# Patient Record
Sex: Male | Born: 1940 | Race: White | Hispanic: No | Marital: Married | Smoking: Former smoker
Health system: Southern US, Community
[De-identification: ages and names within clinical notes are randomized; demographics above are authoritative.]

## PROBLEM LIST (undated history)

## (undated) DIAGNOSIS — I252 Old myocardial infarction: Secondary | ICD-10-CM

## (undated) DIAGNOSIS — E119 Type 2 diabetes mellitus without complications: Secondary | ICD-10-CM

## (undated) DIAGNOSIS — I2510 Atherosclerotic heart disease of native coronary artery without angina pectoris: Secondary | ICD-10-CM

## (undated) DIAGNOSIS — I10 Essential (primary) hypertension: Secondary | ICD-10-CM

## (undated) DIAGNOSIS — R931 Abnormal findings on diagnostic imaging of heart and coronary circulation: Secondary | ICD-10-CM

## (undated) DIAGNOSIS — I255 Ischemic cardiomyopathy: Secondary | ICD-10-CM

## (undated) DIAGNOSIS — E785 Hyperlipidemia, unspecified: Secondary | ICD-10-CM

## (undated) DIAGNOSIS — I2109 ST elevation (STEMI) myocardial infarction involving other coronary artery of anterior wall: Secondary | ICD-10-CM

## (undated) HISTORY — PX: CERVICAL SPINE SURGERY: SHX589

## (undated) HISTORY — DX: Abnormal findings on diagnostic imaging of heart and coronary circulation: R93.1

## (undated) HISTORY — DX: Type 2 diabetes mellitus without complications: E11.9

## (undated) HISTORY — DX: ST elevation (STEMI) myocardial infarction involving other coronary artery of anterior wall: I21.09

## (undated) HISTORY — DX: Essential (primary) hypertension: I10

## (undated) HISTORY — DX: Atherosclerotic heart disease of native coronary artery without angina pectoris: I25.10

## (undated) HISTORY — DX: Old myocardial infarction: I25.2

## (undated) HISTORY — PX: OTHER SURGICAL HISTORY: SHX169

## (undated) HISTORY — DX: Ischemic cardiomyopathy: I25.5

---

## 2003-10-19 ENCOUNTER — Emergency Department (HOSPITAL_COMMUNITY): Admission: EM | Admit: 2003-10-19 | Discharge: 2003-10-19 | Payer: Self-pay | Admitting: Emergency Medicine

## 2003-11-01 ENCOUNTER — Ambulatory Visit (HOSPITAL_COMMUNITY): Admission: RE | Admit: 2003-11-01 | Discharge: 2003-11-01 | Payer: Self-pay | Admitting: Emergency Medicine

## 2014-12-11 ENCOUNTER — Other Ambulatory Visit: Payer: Self-pay | Admitting: Family Medicine

## 2014-12-11 ENCOUNTER — Ambulatory Visit
Admission: RE | Admit: 2014-12-11 | Discharge: 2014-12-11 | Disposition: A | Discharge status: Home / Self Care | Payer: Medicare Other | Source: Ambulatory Visit | Attending: Family Medicine | Admitting: Family Medicine

## 2014-12-11 DIAGNOSIS — M533 Sacrococcygeal disorders, not elsewhere classified: Principal | ICD-10-CM

## 2014-12-11 DIAGNOSIS — G8929 Other chronic pain: Secondary | ICD-10-CM

## 2015-12-08 ENCOUNTER — Other Ambulatory Visit (HOSPITAL_COMMUNITY): Payer: Medicare Other

## 2015-12-08 ENCOUNTER — Encounter (HOSPITAL_COMMUNITY): Payer: Self-pay | Admitting: Nurse Practitioner

## 2015-12-08 ENCOUNTER — Inpatient Hospital Stay (HOSPITAL_COMMUNITY)
Admission: EM | Admit: 2015-12-08 | Discharge: 2015-12-10 | DRG: 247 | Disposition: A | Discharge status: Home / Self Care | Payer: Medicare Other | Source: Ambulatory Visit | Attending: Interventional Cardiology | Admitting: Interventional Cardiology

## 2015-12-08 ENCOUNTER — Encounter (HOSPITAL_COMMUNITY)
Admission: EM | Disposition: A | Discharge status: Home / Self Care | Payer: Self-pay | Source: Ambulatory Visit | Attending: Interventional Cardiology

## 2015-12-08 DIAGNOSIS — D72829 Elevated white blood cell count, unspecified: Secondary | ICD-10-CM | POA: Diagnosis present

## 2015-12-08 DIAGNOSIS — E785 Hyperlipidemia, unspecified: Secondary | ICD-10-CM

## 2015-12-08 DIAGNOSIS — E1159 Type 2 diabetes mellitus with other circulatory complications: Secondary | ICD-10-CM | POA: Diagnosis not present

## 2015-12-08 DIAGNOSIS — I2510 Atherosclerotic heart disease of native coronary artery without angina pectoris: Secondary | ICD-10-CM | POA: Diagnosis present

## 2015-12-08 DIAGNOSIS — I1310 Hypertensive heart and chronic kidney disease without heart failure, with stage 1 through stage 4 chronic kidney disease, or unspecified chronic kidney disease: Secondary | ICD-10-CM | POA: Diagnosis present

## 2015-12-08 DIAGNOSIS — E1122 Type 2 diabetes mellitus with diabetic chronic kidney disease: Secondary | ICD-10-CM | POA: Diagnosis present

## 2015-12-08 DIAGNOSIS — N183 Chronic kidney disease, stage 3 (moderate): Secondary | ICD-10-CM | POA: Diagnosis present

## 2015-12-08 DIAGNOSIS — Z87891 Personal history of nicotine dependence: Secondary | ICD-10-CM

## 2015-12-08 DIAGNOSIS — I2109 ST elevation (STEMI) myocardial infarction involving other coronary artery of anterior wall: Secondary | ICD-10-CM

## 2015-12-08 DIAGNOSIS — R079 Chest pain, unspecified: Secondary | ICD-10-CM | POA: Diagnosis present

## 2015-12-08 DIAGNOSIS — I2102 ST elevation (STEMI) myocardial infarction involving left anterior descending coronary artery: Secondary | ICD-10-CM

## 2015-12-08 DIAGNOSIS — I213 ST elevation (STEMI) myocardial infarction of unspecified site: Secondary | ICD-10-CM

## 2015-12-08 DIAGNOSIS — E119 Type 2 diabetes mellitus without complications: Secondary | ICD-10-CM

## 2015-12-08 DIAGNOSIS — Z955 Presence of coronary angioplasty implant and graft: Secondary | ICD-10-CM

## 2015-12-08 HISTORY — DX: Hyperlipidemia, unspecified: E78.5

## 2015-12-08 HISTORY — DX: Atherosclerotic heart disease of native coronary artery without angina pectoris: I25.10

## 2015-12-08 HISTORY — PX: CARDIAC CATHETERIZATION: SHX172

## 2015-12-08 HISTORY — DX: Type 2 diabetes mellitus without complications: E11.9

## 2015-12-08 LAB — CK TOTAL AND CKMB (NOT AT ARMC)
CK TOTAL: 87 U/L (ref 49–397)
CK TOTAL: 162 U/L (ref 49–397)
CK, MB: 2.8 ng/mL (ref 0.5–5.0)
CK, MB: 17.2 ng/mL — ABNORMAL HIGH (ref 0.5–5.0)
RELATIVE INDEX: INVALID (ref 0.0–2.5)
Relative Index: 10.6 — ABNORMAL HIGH (ref 0.0–2.5)

## 2015-12-08 LAB — COMPREHENSIVE METABOLIC PANEL
ALT: 13 U/L — ABNORMAL LOW (ref 17–63)
AST: 20 U/L (ref 15–41)
Albumin: 3.7 g/dL (ref 3.5–5.0)
Alkaline Phosphatase: 51 U/L (ref 38–126)
Anion gap: 18 — ABNORMAL HIGH (ref 5–15)
BUN: 25 mg/dL — ABNORMAL HIGH (ref 6–20)
CO2: 18 mmol/L — ABNORMAL LOW (ref 22–32)
Calcium: 9.4 mg/dL (ref 8.9–10.3)
Chloride: 103 mmol/L (ref 101–111)
Creatinine, Ser: 1.62 mg/dL — ABNORMAL HIGH (ref 0.61–1.24)
GFR calc Af Amer: 47 mL/min — ABNORMAL LOW (ref >60)
GFR calc non Af Amer: 40 mL/min — ABNORMAL LOW (ref >60)
Glucose, Bld: 199 mg/dL — ABNORMAL HIGH (ref 65–99)
Potassium: 4.7 mmol/L (ref 3.5–5.1)
Sodium: 139 mmol/L (ref 135–145)
Total Bilirubin: 0.4 mg/dL (ref 0.3–1.2)
Total Protein: 6.9 g/dL (ref 6.5–8.1)

## 2015-12-08 LAB — GLUCOSE, CAPILLARY
GLUCOSE-CAPILLARY: 132 mg/dL — AB (ref 65–99)
GLUCOSE-CAPILLARY: 127 mg/dL — AB (ref 65–99)

## 2015-12-08 LAB — CBC
HCT: 39.5 % (ref 39.0–52.0)
Hemoglobin: 12.9 g/dL — ABNORMAL LOW (ref 13.0–17.0)
MCH: 28.4 pg (ref 26.0–34.0)
MCHC: 32.7 g/dL (ref 30.0–36.0)
MCV: 86.8 fL (ref 78.0–100.0)
Platelets: 268 10*3/uL (ref 150–400)
RBC: 4.55 MIL/uL (ref 4.22–5.81)
RDW: 13.7 % (ref 11.5–15.5)
WBC: 15.9 10*3/uL — ABNORMAL HIGH (ref 4.0–10.5)

## 2015-12-08 LAB — PROTIME-INR
INR: 1.03 (ref 0.00–1.49)
Prothrombin Time: 13.7 seconds (ref 11.6–15.2)

## 2015-12-08 LAB — LIPID PANEL
Cholesterol: 177 mg/dL (ref 0–200)
HDL: 31 mg/dL — ABNORMAL LOW (ref >40)
LDL Cholesterol: 108 mg/dL — ABNORMAL HIGH (ref 0–99)
Total CHOL/HDL Ratio: 5.7 RATIO
Triglycerides: 188 mg/dL — ABNORMAL HIGH (ref <150)
VLDL: 38 mg/dL (ref 0–40)

## 2015-12-08 LAB — APTT: aPTT: 26 seconds (ref 24–37)

## 2015-12-08 LAB — TROPONIN I: Troponin I: <0.03 ng/mL (ref <0.031)

## 2015-12-08 LAB — MRSA PCR SCREENING: MRSA by PCR: NEGATIVE

## 2015-12-08 SURGERY — LEFT HEART CATH AND CORONARY ANGIOGRAPHY

## 2015-12-08 MED ORDER — ATORVASTATIN CALCIUM 80 MG PO TABS
80.0000 mg | ORAL_TABLET | Freq: Every day | ORAL | Status: DC
  Filled 2015-12-08 (×2): qty 1

## 2015-12-08 MED ORDER — HEPARIN SODIUM (PORCINE) 1000 UNIT/ML IJ SOLN
INTRAMUSCULAR | Status: DC | PRN
  Administered 2015-12-08: 8000 [IU] via INTRAVENOUS
  Administered 2015-12-08: 3000 [IU] via INTRAVENOUS

## 2015-12-08 MED ORDER — NITROGLYCERIN 1 MG/10 ML FOR IR/CATH LAB
INTRA_ARTERIAL | Status: AC
  Filled 2015-12-08: qty 10

## 2015-12-08 MED ORDER — ONDANSETRON HCL 4 MG/2ML IJ SOLN: 4.0000 mg | Freq: Four times a day (QID) | INTRAMUSCULAR | Status: DC | PRN

## 2015-12-08 MED ORDER — MIDAZOLAM HCL 2 MG/2ML IJ SOLN
INTRAMUSCULAR | Status: DC | PRN
  Administered 2015-12-08: 1 mg via INTRAVENOUS

## 2015-12-08 MED ORDER — VERAPAMIL HCL 2.5 MG/ML IV SOLN
INTRAVENOUS | Status: DC | PRN
  Administered 2015-12-08: 10 mL via INTRA_ARTERIAL

## 2015-12-08 MED ORDER — TIROFIBAN HCL IN NACL 5-0.9 MG/100ML-% IV SOLN: 0.1500 ug/kg/min | INTRAVENOUS | Status: AC

## 2015-12-08 MED ORDER — ACETAMINOPHEN 325 MG PO TABS: 650.0000 mg | ORAL_TABLET | ORAL | Status: DC | PRN

## 2015-12-08 MED ORDER — LIDOCAINE HCL (PF) 1 % IJ SOLN
INTRAMUSCULAR | Status: DC | PRN
  Administered 2015-12-08: 4 mL

## 2015-12-08 MED ORDER — NITROGLYCERIN 1 MG/10 ML FOR IR/CATH LAB
INTRA_ARTERIAL | Status: DC | PRN
  Administered 2015-12-08: 200 ug via INTRACORONARY

## 2015-12-08 MED ORDER — MIDAZOLAM HCL 2 MG/2ML IJ SOLN
INTRAMUSCULAR | Status: AC
  Filled 2015-12-08: qty 2

## 2015-12-08 MED ORDER — TIROFIBAN (AGGRASTAT) BOLUS VIA INFUSION
INTRAVENOUS | Status: DC | PRN
  Administered 2015-12-08: 2500 ug via INTRAVENOUS

## 2015-12-08 MED ORDER — HEPARIN (PORCINE) IN NACL 2-0.9 UNIT/ML-% IJ SOLN
INTRAMUSCULAR | Status: AC
  Filled 2015-12-08: qty 2000

## 2015-12-08 MED ORDER — ASPIRIN 81 MG PO CHEW
81.0000 mg | CHEWABLE_TABLET | Freq: Every day | ORAL | Status: DC
  Administered 2015-12-09 – 2015-12-10 (×2): 81 mg via ORAL
  Filled 2015-12-08 (×2): qty 1

## 2015-12-08 MED ORDER — TIROFIBAN HCL IN NACL 5-0.9 MG/100ML-% IV SOLN
INTRAVENOUS | Status: DC | PRN
  Administered 2015-12-08: 0.075 ug/kg/min via INTRAVENOUS

## 2015-12-08 MED ORDER — LIDOCAINE HCL (PF) 1 % IJ SOLN
INTRAMUSCULAR | Status: AC
  Filled 2015-12-08: qty 30

## 2015-12-08 MED ORDER — SODIUM CHLORIDE 0.9 % IV SOLN: 250.0000 mL | INTRAVENOUS | Status: DC | PRN

## 2015-12-08 MED ORDER — TICAGRELOR 90 MG PO TABS
90.0000 mg | ORAL_TABLET | Freq: Two times a day (BID) | ORAL | Status: DC
  Administered 2015-12-08 – 2015-12-10 (×4): 90 mg via ORAL
  Filled 2015-12-08 (×4): qty 1

## 2015-12-08 MED ORDER — SODIUM CHLORIDE 0.9% FLUSH: 3.0000 mL | INTRAVENOUS | Status: DC | PRN

## 2015-12-08 MED ORDER — FENTANYL CITRATE (PF) 100 MCG/2ML IJ SOLN
INTRAMUSCULAR | Status: DC | PRN
  Administered 2015-12-08: 25 ug via INTRAVENOUS

## 2015-12-08 MED ORDER — TICAGRELOR 90 MG PO TABS
ORAL_TABLET | ORAL | Status: DC | PRN
  Administered 2015-12-08: 180 mg via ORAL

## 2015-12-08 MED ORDER — TICAGRELOR 90 MG PO TABS
ORAL_TABLET | ORAL | Status: AC
  Filled 2015-12-08: qty 1

## 2015-12-08 MED ORDER — HEPARIN SODIUM (PORCINE) 1000 UNIT/ML IJ SOLN
INTRAMUSCULAR | Status: AC
  Filled 2015-12-08: qty 1

## 2015-12-08 MED ORDER — VERAPAMIL HCL 2.5 MG/ML IV SOLN
INTRAVENOUS | Status: AC
  Filled 2015-12-08: qty 2

## 2015-12-08 MED ORDER — SODIUM CHLORIDE 0.9 % WEIGHT BASED INFUSION: 1.0000 mL/kg/h | INTRAVENOUS | Status: AC

## 2015-12-08 MED ORDER — METOPROLOL TARTRATE 12.5 MG HALF TABLET
12.5000 mg | ORAL_TABLET | Freq: Two times a day (BID) | ORAL | Status: DC
  Administered 2015-12-08 – 2015-12-10 (×5): 12.5 mg via ORAL
  Filled 2015-12-08 (×5): qty 1

## 2015-12-08 MED ORDER — FENTANYL CITRATE (PF) 100 MCG/2ML IJ SOLN
INTRAMUSCULAR | Status: AC
  Filled 2015-12-08: qty 2

## 2015-12-08 MED ORDER — SODIUM CHLORIDE 0.9% FLUSH
3.0000 mL | Freq: Two times a day (BID) | INTRAVENOUS | Status: DC
Start: 2015-12-08 — End: 2015-12-10
  Administered 2015-12-08 – 2015-12-09 (×3): 3 mL via INTRAVENOUS

## 2015-12-08 MED ORDER — INSULIN ASPART 100 UNIT/ML ~~LOC~~ SOLN: 0.0000 [IU] | Freq: Three times a day (TID) | SUBCUTANEOUS | Status: DC

## 2015-12-08 MED ORDER — HEPARIN SODIUM (PORCINE) 5000 UNIT/ML IJ SOLN
5000.0000 [IU] | Freq: Three times a day (TID) | INTRAMUSCULAR | Status: DC
  Administered 2015-12-09 (×3): 5000 [IU] via SUBCUTANEOUS
  Filled 2015-12-08 (×4): qty 1

## 2015-12-08 MED ORDER — MORPHINE SULFATE (PF) 2 MG/ML IV SOLN: 1.0000 mg | INTRAVENOUS | Status: DC | PRN

## 2015-12-08 MED ORDER — IOHEXOL 350 MG/ML SOLN
INTRAVENOUS | Status: DC | PRN
  Administered 2015-12-08: 165 mL via INTRACARDIAC

## 2015-12-08 SURGICAL SUPPLY — 25 items
BALLN EUPHORA RX 2.5X15 (BALLOONS) ×4
BALLN ~~LOC~~ EMERGE MR 3.5X15 (BALLOONS) ×4
BALLOON EUPHORA RX 2.5X15 (BALLOONS) IMPLANT
BALLOON ~~LOC~~ EMERGE MR 3.5X15 (BALLOONS) IMPLANT
CATH EXTRAC PRONTO 5.5F 138CM (CATHETERS) ×2 IMPLANT
CATH INFINITI 5 FR 3DRC (CATHETERS) ×2 IMPLANT
CATH INFINITI 5 FR JL3.5 (CATHETERS) ×4 IMPLANT
CATH INFINITI 5FR ANG PIGTAIL (CATHETERS) ×4 IMPLANT
CATH INFINITI JR4 5F (CATHETERS) ×2 IMPLANT
CATH LAUNCHER 6FR 3DRIGHT (CATHETERS) IMPLANT
CATHETER LAUNCHER 6FR 3DRIGHT (CATHETERS) ×4
DEVICE RAD COMP TR BAND LRG (VASCULAR PRODUCTS) ×4 IMPLANT
GLIDESHEATH SLEND SS 6F .021 (SHEATH) ×2 IMPLANT
KIT ENCORE 26 ADVANTAGE (KITS) ×2 IMPLANT
KIT HEART LEFT (KITS) ×4 IMPLANT
PACK CARDIAC CATHETERIZATION (CUSTOM PROCEDURE TRAY) ×4 IMPLANT
STENT PROMUS PREM MR 3.0X20 (Permanent Stent) ×2 IMPLANT
SYR MEDRAD MARK V 150ML (SYRINGE) ×4 IMPLANT
TRANSDUCER W/STOPCOCK (MISCELLANEOUS) ×4 IMPLANT
TUBING CIL FLEX 10 FLL-RA (TUBING) ×4 IMPLANT
VALVE GUARDIAN II ~~LOC~~ HEMO (MISCELLANEOUS) ×2 IMPLANT
WIRE ASAHI PROWATER 180CM (WIRE) ×2 IMPLANT
WIRE HI TORQ BMW 190CM (WIRE) ×2 IMPLANT
WIRE HI TORQ VERSACORE-J 145CM (WIRE) ×2 IMPLANT
WIRE SAFE-T 1.5MM-J .035X260CM (WIRE) ×6 IMPLANT

## 2015-12-08 NOTE — H&P (Signed)
History & Physical    Patient ID: Shane Welch MRN: [id number], DOB/AGE: Mar 03, 1941   Admit date: 12/08/2015   Primary Physician: No primary care provider on file. Primary Cardiologist: New - will f/u in [facility]  Patient Profile    75 y/o ? w/o a prior cardiac hx who presented today 2/2 c/p and antlat STEMI.  Past Medical History    Past Medical History  Diagnosis Date  . Type II diabetes mellitus (HCC)     a. Dx ~ 2010.    Past Surgical History  Procedure Laterality Date  . Cervical spine surgery      a. ~ 2002.  . Right knee surgery      a. 08/2015.     Allergies  Allergies  Allergen Reactions  . Sulfa Antibiotics     History of Present Illness    75 y/o ? w/o prior cardiac history.  He was dx with Type II DM about 7 years ago and has been on oral meds since.  He says that his A1c is typically below 7.  He is a retired Geophysicist/field seismologist and has been living in [place], [place] with his wife since his retirement but she has recently been dx with bladder CA and they are in the process of moving back to [place], [place] so that she may received treatment @ [facility].    Shane Welch was in his USOH until the morning of 2/18, when he was raking leaves in his yard and had sudden onset of fatigue and dyspnea.  He walked back to his home and then developed severe substernal chest discomfort.  When pain persisted, he called EMS and was found to have anterolateral ST elevation with inferior reciprocal changes.  A code STEMI was activated and he was taken emergently to the [facility] cath lab, where he underwent diagnostic cath revealing severe proximal LAD disease with large thrombus burden.  This was successfully stented using a 3.0 x 20 mm promus DES.  He tolerated the procedure well.  Currently, he is c/p free.  Home Medications    Metformin Glipizide Lisinopril  Family History    Family History  Problem Relation Age of Onset  . Cancer Father     died in his 48's.  . Dementia Mother    died in her 75's.    Social History    Social History   Social History  . Marital Status: Married    Spouse Name: N/A  . Number of Children: N/A  . Years of Education: N/A   Occupational History  . Not on file.   Social History Main Topics  . Smoking status: Former Smoker -- 1.00 packs/day for 30 years    Types: Cigarettes  . Smokeless tobacco: Not on file     Comment: quit 1993  . Alcohol Use: 0.0 oz/week    0 Standard drinks or equivalent per week     Comment: rare drink  . Drug Use: No  . Sexual Activity: Not on file   Other Topics Concern  . Not on file   Social History Narrative   Retired Geophysicist/field seismologist.  Following retirement, he moved to [place], [place], where he has lived with his wife.  His wife was recently Dx with bladder CA and they are in the process of moving back to [place], [place] so that she may received treatment @ [facility].  He has been very active.  Enjoys fishing and the outdoors.     Review of Systems  General:  No chills, fever, night sweats or weight changes.  Cardiovascular:  +++ chest pain, dyspnea on exertion, fatigue earlier this AM.  No edema, orthopnea, palpitations, paroxysmal nocturnal dyspnea. Dermatological: No rash, lesions/masses Respiratory: No cough, dyspnea Urologic: No hematuria, dysuria Abdominal:   No nausea, vomiting, diarrhea, bright red blood per rectum, melena, or hematemesis Neurologic:  No visual changes, wkns, changes in mental status. All other systems reviewed and are otherwise negative except as noted above.  Physical Exam    Blood pressure 174/89, pulse 0, resp. rate 0, height 5\' 11"  (1.803 m), weight 220 lb 3.8 oz (99.9 kg), SpO2 0 %.  General: Pleasant, NAD Psych: Normal affect. Neuro: Alert and oriented X 3. Moves all extremities spontaneously. HEENT: Normal  Neck: Supple without bruits or JVD. Lungs:  Resp regular and unlabored, CTA. Heart: RRR no s3, s4, or murmurs. Abdomen: Soft, non-tender, non-distended, BS + x 4.    Extremities: No clubbing, cyanosis or edema. DP/PT/Radials 2+ and equal bilaterally. R wrist cath site with band in place.  No bleeding/bruit/hematoma.  Labs     Recent Labs  12/08/15 1237  TROPONINI <0.03   Lab Results  Component Value Date   WBC 15.9* 12/08/2015   HGB 12.9* 12/08/2015   HCT 39.5 12/08/2015   MCV 86.8 12/08/2015   PLT 268 12/08/2015    Recent Labs Lab 12/08/15 1237  NA 139  K 4.7  CL 103  CO2 18*  BUN 25*  CREATININE 1.62*  CALCIUM 9.4  PROT 6.9  BILITOT 0.4  ALKPHOS 51  ALT 13*  AST 20  GLUCOSE 199*   Lab Results  Component Value Date   CHOL 177 12/08/2015   HDL 31* 12/08/2015   LDLCALC 108* 12/08/2015   TRIG 188* 12/08/2015    Radiology Studies    No results found.  ECG & Cardiac Imaging    Pre PCI: RSR, 92, LAD, antlat ST elev with inf ST dep, pvc's  Post PCI: RSR, 80, PAC's, LAD, delayed R progression.  Assessment & Plan    1.  Acute anterolateral STEMI/CAD:  Pt presented 2/18 with c/p, dyspnea, and fatigue and was found to have antlat ST elevation with inf recip changes.  Now s/p PCI/DES to the LAD with resolution of c/p and ST changes.  Cont asa, bb, high potentcy statin, and brilinta.  He was on ACEI @ home but will hold off on resuming as creat elevated @ 1.62 (was 1.3 in December, 1.2 in October).  Check echo.  Eventual cardiac rehab.  2.  DM II:  Hold home oral meds.  Add SSI.  3.  Hypertensive heart disease:  He says that BP is usually borderline @ home.  He was on lisinopril for renal protection.  Follow on bb.  ACEI on hold in setting of renal insuff of unknown chronicity.  4. Lipids:  Says he was told that he should be on a statin in the past but has been reluctant b/c he has many friends that have not tolerated statins.  He is willing to try @ this time however.  LDL 108 this AM.  LFT's ok.  Cont lipitor 80.    5.  CKD II-III with acute worsening: Creat 1.62 this AM.  Care Everywhere shows creat of 1.2 in 07/2015 and  1.3 in 09/2015.  He is receiving post-cath hydration.  Hold ACEI as above. F/U in AM.  6.  Leukocytosis:  Likely reactive in setting of MI.  Afebrile.  Follow.  Signed,  Nicolasa Ducking, NP 12/08/2015, 2:07 PM    I have examined the patient and reviewed assessment and plan and discussed with patient.  Agree with above as stated.  Patient with emergent cath.  Needs DAPT for a year along with secondary prevention.  Echo to check LV function.  ACE-I when renal function stable.  Aggressive DM control. Continue to cycle enzymes.  VARANASI,JAYADEEP S.

## 2015-12-09 ENCOUNTER — Inpatient Hospital Stay (HOSPITAL_COMMUNITY): Payer: Medicare Other

## 2015-12-09 DIAGNOSIS — I2510 Atherosclerotic heart disease of native coronary artery without angina pectoris: Secondary | ICD-10-CM

## 2015-12-09 DIAGNOSIS — N183 Chronic kidney disease, stage 3 (moderate): Secondary | ICD-10-CM

## 2015-12-09 LAB — LIPID PANEL
CHOLESTEROL: 163 mg/dL (ref 0–200)
HDL: 28 mg/dL — ABNORMAL LOW (ref >40)
LDL CALC: 114 mg/dL — AB (ref 0–99)
TRIGLYCERIDES: 106 mg/dL (ref <150)
Total CHOL/HDL Ratio: 5.8 RATIO
VLDL: 21 mg/dL (ref 0–40)

## 2015-12-09 LAB — BASIC METABOLIC PANEL
ANION GAP: 12 (ref 5–15)
BUN: 23 mg/dL — AB (ref 6–20)
CALCIUM: 9.2 mg/dL (ref 8.9–10.3)
CO2: 25 mmol/L (ref 22–32)
Chloride: 105 mmol/L (ref 101–111)
Creatinine, Ser: 1.5 mg/dL — ABNORMAL HIGH (ref 0.61–1.24)
GFR calc Af Amer: 51 mL/min — ABNORMAL LOW (ref >60)
GFR, EST NON AFRICAN AMERICAN: 44 mL/min — AB (ref >60)
Glucose, Bld: 102 mg/dL — ABNORMAL HIGH (ref 65–99)
POTASSIUM: 4.9 mmol/L (ref 3.5–5.1)
SODIUM: 142 mmol/L (ref 135–145)

## 2015-12-09 LAB — CBC
HCT: 38.3 % — ABNORMAL LOW (ref 39.0–52.0)
Hemoglobin: 12.3 g/dL — ABNORMAL LOW (ref 13.0–17.0)
MCH: 28.2 pg (ref 26.0–34.0)
MCHC: 32.1 g/dL (ref 30.0–36.0)
MCV: 87.8 fL (ref 78.0–100.0)
Platelets: 247 10*3/uL (ref 150–400)
RBC: 4.36 MIL/uL (ref 4.22–5.81)
RDW: 13.6 % (ref 11.5–15.5)
WBC: 10.8 10*3/uL — AB (ref 4.0–10.5)

## 2015-12-09 LAB — GLUCOSE, CAPILLARY
GLUCOSE-CAPILLARY: 102 mg/dL — AB (ref 65–99)
Glucose-Capillary: 113 mg/dL — ABNORMAL HIGH (ref 65–99)
Glucose-Capillary: 94 mg/dL (ref 65–99)

## 2015-12-09 LAB — CK TOTAL AND CKMB (NOT AT ARMC)
CK, MB: 18.2 ng/mL — AB (ref 0.5–5.0)
RELATIVE INDEX: 9.8 — AB (ref 0.0–2.5)
Total CK: 185 U/L (ref 49–397)

## 2015-12-09 MED ORDER — GLIMEPIRIDE 1 MG PO TABS: 2.0000 mg | ORAL_TABLET | Freq: Every day | ORAL | Status: DC

## 2015-12-09 MED ORDER — ACETAMINOPHEN 325 MG PO TABS: 650.0000 mg | ORAL_TABLET | Freq: Four times a day (QID) | ORAL | Status: DC | PRN

## 2015-12-09 NOTE — Progress Notes (Signed)
Utilization Review Completed.Delainy Mcelhiney T2/19/2017  

## 2015-12-09 NOTE — Progress Notes (Addendum)
SUBJECTIVE:  Feels very well this morning. No chest discomfort. Feels back to normal.  OBJECTIVE:   Vitals:   Filed Vitals:   12/09/15 0200 12/09/15 0400 12/09/15 0500 12/09/15 0600  BP: 136/57 128/69 124/64 124/71  Pulse: 57 55 56 58  Temp:  98.4 F (36.9 C)    TempSrc:  Oral    Resp: 14 16 12 12   Height:      Weight:      SpO2: 100% 100% 98% 97%   I&O's:   Intake/Output Summary (Last 24 hours) at 12/09/15 2297 Last data filed at 12/09/15 0200  Gross per 24 hour  Intake   1010 ml  Output   2275 ml  Net  -1265 ml   TELEMETRY: Reviewed telemetry pt in normal sinus rhythm:     PHYSICAL EXAM General: Well developed, well nourished, in no acute distress Head:   Normal cephalic and atramatic  Lungs:   Clear bilaterally to auscultation. Heart:   HRRR S1 S2  No JVD.  2+ right radial pulse, no hematoma, no bruising Abdomen: abdomen soft and non-tender Msk:  Back normal,  Normal strength and tone for age. Extremities:   No edema.   Neuro: Alert and oriented. Psych:  Normal affect, responds appropriately Skin: No rash   LABS: Basic Metabolic Panel:  Recent Labs  98/92/11 1237 12/09/15 0219  NA 139 142  K 4.7 4.9  CL 103 105  CO2 18* 25  GLUCOSE 199* 102*  BUN 25* 23*  CREATININE 1.62* 1.50*  CALCIUM 9.4 9.2   Liver Function Tests:  Recent Labs  12/08/15 1237  AST 20  ALT 13*  ALKPHOS 51  BILITOT 0.4  PROT 6.9  ALBUMIN 3.7   No results for input(s): LIPASE, AMYLASE in the last 72 hours. CBC:  Recent Labs  12/08/15 1237 12/09/15 0219  WBC 15.9* 10.8*  HGB 12.9* 12.3*  HCT 39.5 38.3*  MCV 86.8 87.8  PLT 268 247   Cardiac Enzymes:  Recent Labs  12/08/15 1237 12/08/15 1400 12/08/15 1930 12/09/15 0219  CKTOTAL  --  87 162 185  CKMB  --  2.8 17.2* 18.2*  TROPONINI <0.03  --   --   --    BNP: Invalid input(s): POCBNP D-Dimer: No results for input(s): DDIMER in the last 72 hours. Hemoglobin A1C: No results for input(s): HGBA1C in  the last 72 hours. Fasting Lipid Panel:  Recent Labs  12/09/15 0219  CHOL 163  HDL 28*  LDLCALC 114*  TRIG 106  CHOLHDL 5.8   Thyroid Function Tests: No results for input(s): TSH, T4TOTAL, T3FREE, THYROIDAB in the last 72 hours.  Invalid input(s): FREET3 Anemia Panel: No results for input(s): VITAMINB12, FOLATE, FERRITIN, TIBC, IRON, RETICCTPCT in the last 72 hours. Coag Panel:   Lab Results  Component Value Date   INR 1.03 12/08/2015    RADIOLOGY: No results found.    ASSESSMENT: Status post anterior wall myocardial infarction, diabetes, chronic renal insufficiency  PLAN:   1) I stressed the importance of dual antiplatelet therapy given his recent stent.  He'll need aggressive secondary prevention including beta blocker, statin. Holding off on ACE inhibitor at this time. He does have some mild renal insufficiency. This appears stable. Await echocardiogram results to evaluate left ventricular function. Certainly, if his ejection fraction is less than 40%, would add an ACE inhibitor. He would benefit from a low-dose ACE inhibitor regardless given his diabetes, as long as his renal function tolerates this.  Restart ACE  inhibitor tomorrow morning if creatinine is stable.  2) hyperlipidemia: LDL above target. Continue atorvastatin 80 mg daily.  3) diabetes: Blood sugar well-controlled early this morning. Continue sliding scale insulin. Metformin and restart tomorrow.  4) He would benefit from walking with cardiac rehabilitation. We'll plan on transferring to telemetry.  Possible discharge tomorrow.  He lives in [place]. He may decide he wants to follow-up in that office.  Corky Crafts, MD  12/09/2015  8:32 AM

## 2015-12-09 NOTE — Progress Notes (Signed)
*  PRELIMINARY RESULTS* Echocardiogram 2D Echocardiogram has been performed.  Shane Welch 12/09/2015, 11:13 AM

## 2015-12-10 ENCOUNTER — Encounter (HOSPITAL_COMMUNITY): Payer: Self-pay | Admitting: Interventional Cardiology

## 2015-12-10 ENCOUNTER — Telehealth: Payer: Self-pay | Admitting: Interventional Cardiology

## 2015-12-10 DIAGNOSIS — E119 Type 2 diabetes mellitus without complications: Secondary | ICD-10-CM

## 2015-12-10 DIAGNOSIS — I2109 ST elevation (STEMI) myocardial infarction involving other coronary artery of anterior wall: Principal | ICD-10-CM

## 2015-12-10 DIAGNOSIS — E785 Hyperlipidemia, unspecified: Secondary | ICD-10-CM

## 2015-12-10 DIAGNOSIS — I2510 Atherosclerotic heart disease of native coronary artery without angina pectoris: Secondary | ICD-10-CM

## 2015-12-10 HISTORY — DX: Type 2 diabetes mellitus without complications: E11.9

## 2015-12-10 HISTORY — DX: Atherosclerotic heart disease of native coronary artery without angina pectoris: I25.10

## 2015-12-10 LAB — BASIC METABOLIC PANEL WITH GFR
Anion gap: 9 (ref 5–15)
BUN: 19 mg/dL (ref 6–20)
CO2: 26 mmol/L (ref 22–32)
Calcium: 9.6 mg/dL (ref 8.9–10.3)
Chloride: 107 mmol/L (ref 101–111)
Creatinine, Ser: 1.29 mg/dL — ABNORMAL HIGH (ref 0.61–1.24)
GFR calc Af Amer: >60 mL/min (ref >60)
GFR calc non Af Amer: 53 mL/min — ABNORMAL LOW (ref >60)
Glucose, Bld: 132 mg/dL — ABNORMAL HIGH (ref 65–99)
Potassium: 5 mmol/L (ref 3.5–5.1)
Sodium: 142 mmol/L (ref 135–145)

## 2015-12-10 LAB — POCT I-STAT, CHEM 8
BUN: 26 mg/dL — ABNORMAL HIGH (ref 6–20)
CREATININE: 1.5 mg/dL — AB (ref 0.61–1.24)
Calcium, Ion: 1.15 mmol/L (ref 1.13–1.30)
Chloride: 105 mmol/L (ref 101–111)
Glucose, Bld: 201 mg/dL — ABNORMAL HIGH (ref 65–99)
HEMATOCRIT: 41 % (ref 39.0–52.0)
HEMOGLOBIN: 13.9 g/dL (ref 13.0–17.0)
POTASSIUM: 4.5 mmol/L (ref 3.5–5.1)
SODIUM: 138 mmol/L (ref 135–145)
TCO2: 21 mmol/L (ref 0–100)

## 2015-12-10 LAB — HEMOGLOBIN A1C
Hgb A1c MFr Bld: 6.7 % — ABNORMAL HIGH (ref 4.8–5.6)
Mean Plasma Glucose: 146 mg/dL

## 2015-12-10 LAB — GLUCOSE, CAPILLARY: Glucose-Capillary: 115 mg/dL — ABNORMAL HIGH (ref 65–99)

## 2015-12-10 LAB — POCT ACTIVATED CLOTTING TIME
ACTIVATED CLOTTING TIME: 198 s
Activated Clotting Time: 250 s

## 2015-12-10 MED ORDER — ENALAPRIL MALEATE 2.5 MG PO TABS: 2.5000 mg | ORAL_TABLET | Freq: Every day | ORAL | Status: DC

## 2015-12-10 MED ORDER — LISINOPRIL 2.5 MG PO TABS
2.5000 mg | ORAL_TABLET | Freq: Every day | ORAL | Status: DC
  Filled 2015-12-10: qty 1

## 2015-12-10 MED ORDER — NITROGLYCERIN 0.4 MG SL SUBL: 0.4000 mg | SUBLINGUAL_TABLET | SUBLINGUAL | Status: DC | PRN

## 2015-12-10 MED ORDER — METOPROLOL TARTRATE 25 MG PO TABS: 12.5000 mg | ORAL_TABLET | Freq: Two times a day (BID) | ORAL | Status: DC

## 2015-12-10 MED ORDER — ASPIRIN 81 MG PO CHEW: 81.0000 mg | CHEWABLE_TABLET | Freq: Every day | ORAL | Status: DC

## 2015-12-10 MED ORDER — ATORVASTATIN CALCIUM 80 MG PO TABS: 80.0000 mg | ORAL_TABLET | Freq: Every day | ORAL | Status: DC

## 2015-12-10 MED ORDER — TICAGRELOR 90 MG PO TABS: 90.0000 mg | ORAL_TABLET | Freq: Two times a day (BID) | ORAL | Status: DC

## 2015-12-10 MED FILL — Heparin Sodium (Porcine) 2 Unit/ML in Sodium Chloride 0.9%: INTRAMUSCULAR | Qty: 1500 | Status: AC

## 2015-12-10 NOTE — Discharge Summary (Signed)
Discharge Summary    Patient ID: Shane Welch,  MRN: [id number], DOB/AGE: 75/75/1942 75 y.o.  Admit date: 12/08/2015 Discharge date: 12/10/2015  Primary Care Provider: No primary care provider on file. Primary Cardiologist: Dr. Eldridge Dace  Discharge Diagnoses    Principal Problem:   Acute MI anterior wall first episode care [facility]) Active Problems:   CAD (coronary artery disease)   Hyperlipidemia   Diabetes (HCC)   Allergies Allergies  Allergen Reactions  . Sulfa Antibiotics Hives    Diagnostic Studies/Procedures    Cardiac cath 12/08/2015 Conclusion     Mid LAD lesion, 99% stenosed. Post intervention with aspiration thrombectomy and a 3.0 x 20 Promus drug-eluting stent postdilated to 3.5 mm, there is a 0% residual stenosis.  Normal LVEDP. Left ventricular gram not done because wants to avoid excessive contrast exposure.  From the right wrist, an EBU 3.5 guide catheter may better engage the left main a future procedure is needed.  Left dominant system. Non-dominant RCA was no selectively injected.  Continue dual antiplatelet therapy for at least a year. He'll need aggressive secondary prevention and diabetes control. Continue IV tirofiban for 4 hours. He'll be watched in the ICU. I suspect to be in the hospital for 2 days. Assess left ventricular function with echocardiogram.  Beta blocker, aspirin, Brilinta and statin. Hold metformin for 48 hours at least. We'll need to see when creatinine is in the normal range.      Echocardiogram 12/09/2015 LV EF: 40% -  45%  ------------------------------------------------------------------- Indications:   CAD of native vessels 414.01.  ------------------------------------------------------------------- History:  PMH: Former Smoker. PMH:  Myocardial infarction. Risk factors: Diabetes mellitus.  ------------------------------------------------------------------- Study Conclusions  - Left ventricle: The cavity  size was normal. Wall thickness was increased in a pattern of moderate LVH. Systolic function was mildly to moderately reduced. The estimated ejection fraction was in the range of 40% to 45%. Akinesis of the midanteroseptal myocardium. Doppler parameters are consistent with abnormal left ventricular relaxation (grade 1 diastolic dysfunction). - Right atrium: The atrium was mildly dilated.  _____________   History of Present Illness     75 y/o ? w/o prior cardiac history. He was dx with Type II DM about 7 years ago and has been on oral meds since. He says that his A1c is typically below 7. 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   Shane Welch was in his USOH until the morning of 2/18, when he was raking leaves in his yard and had sudden onset of fatigue and dyspnea. He walked back to his home and then developed severe substernal chest discomfort. When pain persisted, he called EMS and was found to have anterolateral ST elevation with inferior reciprocal changes. A code STEMI was activated and he was taken emergently to the [facility] cath lab, where he underwent diagnostic cath revealing severe proximal LAD disease with large thrombus burden. This was successfully stented using a 3.0 x 20 mm promus DES. He tolerated the procedure well.   Hospital Course      Cardiac catheterization performed on 12/08/2015 showed a 99% mid LAD lesion treated with aspiration thrombectomy and 3.0x20 promus DES postdilated to 3.25mm, normal LVEDP. Post cath, patient was placed on high-dose Lipitor, aspirin, Brilinta, metoprolol. His ACE  inhibitor was held. Lipid panel obtained on 2/19 showed cholesterol 163, triglyceride 106, HDL 28, LDL 114. Echocardiogram obtained on the following day showed EF 40-45%,  akinesis of mid anteroseptal myocardium, grade 1 DD. His creatinine improved after cath, we have restarted on low-dose ramipril given his LV dysfunction.  He was seen in the morning of 12/10/2015, at which time he denies any significant chest discomfort or shortness breath. He has ambulated with cardiac rehabilitation without significant discomfort. He did have question on Lipitor. He states he had memory difficulty after started on pravastatin years ago, we have discussed the importance of controlling his cholesterol given coronary artery disease, he is willing to try high-dose Lipitor for now. If he has recurrent memory difficulty after starting on Lipitor, we can either try Zetia or even PCSK 9 inhibitor at a later time. I have arranged seven-day transition of care follow-up. We have discussed holding metformin today and restarting tomorrow given contrast I use. I have also emphasis on the importance of DAPT. Although patient is closer to [facility] and [place] office, however he wished to follow-up with Dr. Eldridge Dace here. I have arranged 3 month follow-up with Dr. Eldridge Dace.   _____________  Discharge Vitals Blood pressure 118/67, pulse 61, temperature 97.9 F (36.6 C), temperature source Oral, resp. rate 18, height 5\' 11"  (1.803 m), weight 212 lb 8 oz (96.389 kg), SpO2 98 %.  Filed Weights   12/08/15 1311 12/10/15 0505  Weight: 220 lb 3.8 oz (99.9 kg) 212 lb 8 oz (96.389 kg)    Labs & Radiologic Studies     CBC  Recent Labs  12/08/15 1237 12/09/15 0219  WBC 15.9* 10.8*  HGB 12.9* 12.3*  HCT 39.5 38.3*  MCV 86.8 87.8  PLT 268 247   Basic Metabolic Panel  Recent Labs  12/09/15 0219 12/10/15 0605  NA 142 142  K 4.9 5.0  CL 105 107  CO2 25 26  GLUCOSE 102* 132*  BUN 23* 19  CREATININE 1.50* 1.29*  CALCIUM 9.2 9.6   Liver Function Tests  Recent Labs  12/08/15 1237  AST 20  ALT 13*  ALKPHOS 51  BILITOT 0.4  PROT 6.9  ALBUMIN 3.7   Cardiac Enzymes  Recent Labs   12/08/15 1237 12/08/15 1400 12/08/15 1930 12/09/15 0219  CKTOTAL  --  87 162 185  CKMB  --  2.8 17.2* 18.2*  TROPONINI <0.03  --   --   --    Hemoglobin A1C  Recent Labs  12/09/15 0219  HGBA1C 6.7*   Fasting Lipid Panel  Recent Labs  12/09/15 0219  CHOL 163  HDL 28*  LDLCALC 114*  TRIG 106  CHOLHDL 5.8     Disposition   Pt is being discharged home today in good condition.  Follow-up Plans & Appointments    Follow-up Information    Follow up with Joni Reining, NP On 12/17/2015.   Specialties:  Nurse Practitioner, Radiology, Cardiology   Why:  3:10PM. Cardiology Transition of Care visit   Contact information:   [address] [place] [address] [phone]       Follow up with Corky Crafts., MD On 03/11/2016.   Specialties:  Cardiology, Radiology, Interventional Cardiology   Why:  1:30PM. Cardiology visit.    Contact information:   [address] [place] [address] [phone]      Discharge Instructions    Amb Referral to Cardiac Rehabilitation    Complete by:  As directed   Diagnosis:  Myocardial Infarction PCI             Discharge Medications   Current Discharge Medication List    START taking these medications   Details  aspirin 81 MG chewable tablet Chew 1 tablet (81 mg total) by mouth daily.    atorvastatin (LIPITOR) 80 MG tablet Take 1 tablet (80 mg total) by mouth daily at 6 PM. Qty: 90 tablet, Refills: 3    metoprolol tartrate (LOPRESSOR) 25 MG tablet Take 0.5 tablets (12.5 mg total) by mouth 2 (two) times daily. Qty: 90 tablet, Refills: 3    nitroGLYCERIN (NITROSTAT) 0.4 MG SL tablet Place 1 tablet (0.4 mg total) under the tongue every 5 (five) minutes as needed. Qty: 25 tablet, Refills: 3    ticagrelor (BRILINTA) 90 MG TABS tablet Take 1 tablet (90 mg total) by mouth 2 (two) times daily. Qty: 180 tablet, Refills: 3      CONTINUE these medications which have CHANGED   Details  enalapril  (VASOTEC) 2.5 MG tablet Take 1 tablet (2.5 mg total) by mouth daily. Qty: 90 tablet, Refills: 3      CONTINUE these medications which have NOT CHANGED   Details  acetaminophen (TYLENOL) 325 MG tablet Take 650 mg by mouth every 6 (six) hours as needed for mild pain, moderate pain or headache.    glimepiride (AMARYL) 2 MG tablet Take 2 mg by mouth daily at 12 noon.    metFORMIN (GLUCOPHAGE) 1000 MG tablet Take 1,000 mg by mouth 2 (two) times daily.         Aspirin prescribed at discharge?  Yes High Intensity Statin Prescribed? (Lipitor 40-80mg  or Crestor 20-40mg ): Yes Beta Blocker Prescribed? Yes For EF 45% or less, Was ACEI/ARB Prescribed? Yes ADP Receptor Inhibitor Prescribed? (i.e. Plavix etc.-Includes Medically Managed Patients): Yes For EF <40%, Aldosterone Inhibitor Prescribed? No: EF 40-45%, BP borderline, needed to add ACEI. Was EF assessed during THIS hospitalization? Yes Was Cardiac Rehab II ordered? (Included Medically managed Patients): Yes   Outstanding Labs/Studies   None  Duration of Discharge Encounter   Greater than 30 minutes including physician time.  Ramond Dial PA-C 12/10/2015, 11:21 AM

## 2015-12-10 NOTE — Progress Notes (Signed)
Patient Name: Shane Welch Date of Encounter: 12/10/2015  Primary Cardiologist: Dr. Eldridge Dace   Principal Problem:   Acute MI anterior wall first episode care [facility]) Active Problems:   CAD (coronary artery disease)   Hyperlipidemia   Diabetes (HCC)    SUBJECTIVE  Denies any CP or SOB.   CURRENT MEDS . aspirin  81 mg Oral Daily  . atorvastatin  80 mg Oral q1800  . glimepiride  2 mg Oral Q1200  . heparin  5,000 Units Subcutaneous 3 times per day  . insulin aspart  0-15 Units Subcutaneous TID WC  . lisinopril  2.5 mg Oral Daily  . metoprolol tartrate  12.5 mg Oral BID  . sodium chloride flush  3 mL Intravenous Q12H  . ticagrelor  90 mg Oral BID    OBJECTIVE  Filed Vitals:   12/09/15 1200 12/09/15 1527 12/09/15 2100 12/10/15 0505  BP: 140/65 137/69 112/51 118/67  Pulse:  71 62 61  Temp: 97.8 F (36.6 C) 97.9 F (36.6 C) 98.4 F (36.9 C) 97.9 F (36.6 C)  TempSrc: Oral Oral  Oral  Resp: 16  18 18   Height:      Weight:    212 lb 8 oz (96.389 kg)  SpO2: 99% 100% 98% 98%    Intake/Output Summary (Last 24 hours) at 12/10/15 0932 Last data filed at 12/10/15 0500  Gross per 24 hour  Intake    473 ml  Output   1150 ml  Net   -677 ml   Filed Weights   12/08/15 1311 12/10/15 0505  Weight: 220 lb 3.8 oz (99.9 kg) 212 lb 8 oz (96.389 kg)    PHYSICAL EXAM  General: Pleasant, NAD. Neuro: Alert and oriented X 3. Moves all extremities spontaneously. Psych: Normal affect. HEENT:  Normal  Neck: Supple without bruits or JVD. Lungs:  Resp regular and unlabored, CTA. Heart: RRR no s3, s4, or murmurs. R radial cath site stable, no hematoma or bleeding.  Abdomen: Soft, non-tender, non-distended, BS + x 4.  Extremities: No clubbing, cyanosis or edema. DP/PT/Radials 2+ and equal bilaterally.  Accessory Clinical Findings  CBC  Recent Labs  12/08/15 1237 12/09/15 0219  WBC 15.9* 10.8*  HGB 12.9* 12.3*  HCT 39.5 38.3*  MCV 86.8 87.8  PLT 268 247   Basic Metabolic  Panel  Recent Labs  29/93/71 0219 12/10/15 0605  NA 142 142  K 4.9 5.0  CL 105 107  CO2 25 26  GLUCOSE 102* 132*  BUN 23* 19  CREATININE 1.50* 1.29*  CALCIUM 9.2 9.6   Liver Function Tests  Recent Labs  12/08/15 1237  AST 20  ALT 13*  ALKPHOS 51  BILITOT 0.4  PROT 6.9  ALBUMIN 3.7   Cardiac Enzymes  Recent Labs  12/08/15 1237 12/08/15 1400 12/08/15 1930 12/09/15 0219  CKTOTAL  --  87 162 185  CKMB  --  2.8 17.2* 18.2*  TROPONINI <0.03  --   --   --    Fasting Lipid Panel  Recent Labs  12/09/15 0219  CHOL 163  HDL 28*  LDLCALC 114*  TRIG 106  CHOLHDL 5.8    TELE NSR without significant ventricular ectopy    ECG  No new EKG  Echocardiogram 12/09/2015  LV EF: 40% -  45%  ------------------------------------------------------------------- Indications:   CAD of native vessels 414.01.  ------------------------------------------------------------------- History:  PMH: Former Smoker. PMH:  Myocardial infarction. Risk factors: Diabetes mellitus.  ------------------------------------------------------------------- Study Conclusions  - Left ventricle: The cavity  size was normal. Wall thickness was increased in a pattern of moderate LVH. Systolic function was mildly to moderately reduced. The estimated ejection fraction was in the range of 40% to 45%. Akinesis of the midanteroseptal myocardium. Doppler parameters are consistent with abnormal left ventricular relaxation (grade 1 diastolic dysfunction). - Right atrium: The atrium was mildly dilated.    Radiology/Studies  No results found.  ASSESSMENT AND PLAN  1. Acute anterolateral STEMI  - cath 12/08/2015 99% mid LAD lesion treated with aspiration thrombectomy and 3.0x20 promus DES postdilated to 3.11mm, normal LVEDP  - Echo 12/09/2015 EF 40-45%, akinesis of mid anteroseptal myocardium, grade 1 DD  - continue ASA, lipitor, metoprolol, Brilinta. Add 2.5mg  lisinopril.  Uptitrate as outpatient as BP allows. Discharge today.   2. CAD: newly diagnosed as above  3. HLD: Lipid panel 12/09/2015 chol 163, trig 106, HDL 28, LDL 114  3. DM II: hold metformin for 48 hours after cath  4. CKD stage II-III: Cr improved to 1.29  5. Leukocytosis in the setting of acute MI: resolving.   Ramond Dial PA-C Pager: [phone]  The patient was seen, examined and discussed with Azalee Course, PA-C and I agree with the above.   75 year old male post STEMI on 12/08/15, stable, chest pain free, continue ASA, Lipitor, Brilinta, lisinopril. If he doesn't tolerate Lipitor we will refer to the lipid clinic for consideration for PC-SK 9 inhibitors.   Lars Masson 12/10/2015

## 2015-12-10 NOTE — Discharge Instructions (Signed)
No lifting over 5 lbs for 1 week. No sexual activity for 1 week. Keep procedure site clean & dry. If you notice increased pain, swelling, bleeding or pus, call/return!  You may shower, but no soaking baths/hot tubs/pools for 1 week °

## 2015-12-10 NOTE — Care Management (Signed)
1122 12-10-15 Tomi Bamberger, RN,BSN [phone] CM did provide pt with the 30 day free Brilinta Card. CM did call [facility] in [place] and medication is available. Co pay will be $70.00 thereafter. No further needs from CM at this time.

## 2015-12-10 NOTE — Progress Notes (Signed)
CARDIAC REHAB PHASE I   PRE:  Rate/Rhythm: 70 SR  BP:  Supine: 120/76  Sitting:   Standing:    SaO2:   MODE:  Ambulation: 550 ft   POST:  Rate/Rhythm: 92 SR  BP:  Supine:   Sitting: 122/62  Standing:    SaO2:  0800-0900 Pt walked 550 ft with steady gait. No CP. Tolerated well. MI education completed with pt who voiced understanding. Stressed importance of brilinta with stent. Needs to see case manager for card. Discussed CRP 2 and referring to [facility]. Pt has had some difficulty with statins in past and hesitant to take. Cardiologist will need to discuss. Gave heart healthy and diabetic diets. Reviewed NTG use.   Luetta Nutting, RN BSN  12/10/2015 8:54 AM

## 2015-12-10 NOTE — Progress Notes (Signed)
Pt discharged home. Discharge instructions have been gone over with the patient. IV's removed. Pt given unit number and told to call if they have any concerns regarding their discharge instructions.   

## 2015-12-10 NOTE — Telephone Encounter (Signed)
TCM phone call   Appt on 12/17/15 at 3:10pm w/ Lorin Picket at the [facility]

## 2015-12-13 NOTE — Telephone Encounter (Signed)
Patient contacted regarding discharge from [facility] on 12/10/15.  Patient understands to follow up with provider Joni Reining on 12/17/15 at 3:10 at [facility] office.. Patient understands discharge instructions? yes Patient understands medications and regiment? yes Patient understands to bring all medications to this visit? yes  States he is doing good.  Still weak and no energy.  Denies SOB or CP.  Has not taken BP because he doesn't have a cuff.  Advised would be to his advantage to purchase an upper arm cuff.  States he will try to get one. Reviewed his medications and he states he was able to get all of his medications and verbalizes understanding on how to take them.  Cath was performed thru his (R) wrist which he states is not red or swollen.  Is scheduled to follow up in [place] office.  Gave him address and phone number.  He will call if he has any further questions.

## 2015-12-17 ENCOUNTER — Encounter: Payer: Self-pay | Admitting: Adult Health

## 2015-12-17 ENCOUNTER — Ambulatory Visit (INDEPENDENT_AMBULATORY_CARE_PROVIDER_SITE_OTHER): Payer: Medicare Other | Admitting: Adult Health

## 2015-12-17 VITALS — BP 130/76 | HR 70 | Ht 71.0 in | Wt 215.0 lb

## 2015-12-17 DIAGNOSIS — E0800 Diabetes mellitus due to underlying condition with hyperosmolarity without nonketotic hyperglycemic-hyperosmolar coma (NKHHC): Secondary | ICD-10-CM

## 2015-12-17 DIAGNOSIS — I2510 Atherosclerotic heart disease of native coronary artery without angina pectoris: Secondary | ICD-10-CM

## 2015-12-17 DIAGNOSIS — E7800 Pure hypercholesterolemia, unspecified: Secondary | ICD-10-CM

## 2015-12-17 DIAGNOSIS — E876 Hypokalemia: Secondary | ICD-10-CM

## 2015-12-17 MED ORDER — ASPIRIN EC 81 MG PO TBEC: 81.0000 mg | DELAYED_RELEASE_TABLET | Freq: Every day | ORAL | Status: DC

## 2015-12-17 NOTE — Progress Notes (Signed)
Cardiology Office Note   Date:  12/17/2015   ID:  Shane Welch, DOB April 07, 1941, MRN [id number]  PCP:  Kaleen Mask, MD  Cardiologist: Joni Reining, NP Eldridge Dace  Chief Complaint  Patient presents with  . Coronary Artery Disease      History of Present Illness: Shane Welch is a 75 y.o. male who presents for post hospitalization follow-up after admission for chest pain, with diagnosis of anterior lateral STEMI.  He had emergent cardiac catheterization on 12/08/2015, which revealed 99%, mid LAD, which was treated with aspiration thrombectomy and drug-eluting stent.  The patient was placed on dual antiplatelet therapy with Brilinta, and aspirin.he was started on low-dose Lipitor, although the patient had had a history of difficulty with statins in the past. He was started on a low-dose ACE inhibitor.He wishes to follow-p with Dr. Hinda Glatter in [place], but is here for TCM visit.  Today he comes with multiple questions.  He also has complaints of generalized fatigue.  He has questions about his medication adjustments concerning his ACE inhibitor.  He states that he had 3 days of diarrhea, which passed on its own.  He also has questions about when he needs to take his glimepiride and metformin.  He denies recurrent chest pain.  He is not having any issues with bleeding, bruising, or obtaining medications.  Past Medical History  Diagnosis Date  . Type II diabetes mellitus (HCC)     a. Dx ~ 2010.  Marland Kitchen CAD (coronary artery disease), native coronary artery   . Hyperlipidemia     Past Surgical History  Procedure Laterality Date  . Cervical spine surgery      a. ~ 2002.  . Right knee surgery      a. 08/2015.  . Cardiac catheterization N/A 12/08/2015    Procedure: Left Heart Cath and Coronary Angiography;  Surgeon: Corky Crafts, MD;  Location: [facility] INVASIVE CV LAB;  Service: Cardiovascular;  Laterality: N/A;  . Cardiac catheterization  12/08/2015    Procedure: Coronary Stent  Intervention;  Surgeon: Corky Crafts, MD;  Location: [facility] INVASIVE CV LAB;  Service: Cardiovascular;;     Current Outpatient Prescriptions  Medication Sig Dispense Refill  . acetaminophen (TYLENOL) 325 MG tablet Take 650 mg by mouth every 6 (six) hours as needed for mild pain, moderate pain or headache.    Marland Kitchen atorvastatin (LIPITOR) 80 MG tablet Take 1 tablet (80 mg total) by mouth daily at 6 PM. 90 tablet 3  . enalapril (VASOTEC) 2.5 MG tablet Take 1 tablet (2.5 mg total) by mouth daily. 90 tablet 3  . glimepiride (AMARYL) 2 MG tablet Take 2 mg by mouth daily at 12 noon.    . metFORMIN (GLUCOPHAGE) 1000 MG tablet Take 1,000 mg by mouth 2 (two) times daily.    . metoprolol tartrate (LOPRESSOR) 25 MG tablet Take 0.5 tablets (12.5 mg total) by mouth 2 (two) times daily. 90 tablet 3  . nitroGLYCERIN (NITROSTAT) 0.4 MG SL tablet Place 1 tablet (0.4 mg total) under the tongue every 5 (five) minutes as needed. 25 tablet 3  . ticagrelor (BRILINTA) 90 MG TABS tablet Take 1 tablet (90 mg total) by mouth 2 (two) times daily. 180 tablet 3  . aspirin EC 81 MG tablet Take 1 tablet (81 mg total) by mouth daily. 90 tablet 3   No current facility-administered medications for this visit.    Allergies:   Sulfa antibiotics    Social History:  The patient  reports that he has quit smoking.  His smoking use included Cigarettes. He has a 30 pack-year smoking history. He does not have any smokeless tobacco history on file. He reports that he drinks alcohol. He reports that he does not use illicit drugs.   Family History:  The patient's family history includes Cancer in his father; Dementia in his mother.    ROS: All other systems are reviewed and negative. Unless otherwise mentioned in H&P    PHYSICAL EXAM: VS:  BP 130/76 mmHg  Pulse 70  Ht 5\' 11"  (1.803 m)  Wt 215 lb (97.523 kg)  BMI 30.00 kg/m2  SpO2 98% , BMI Body mass index is 30 kg/(m^2). GEN: Well nourished, well developed, in no acute  distress HEENT: normal Neck: no JVD, carotid bruits, or masses Cardiac:RRR; no murmurs, rubs, or gallops,no edema  Respiratory:  clear to auscultation bilaterally, normal work of breathing GI: soft, nontender, nondistended, + BS MS: no deformity or atrophycatheterization insertion site on right wrist is well healed and healthy Skin: warm and dry, no rash Neuro:  Strength and sensation are intact Psych: euthymic mood, full affect     Recent Labs: 12/08/2015: ALT 13* 12/09/2015: Hemoglobin 12.3*; Platelets 247 12/10/2015: BUN 19; Creatinine, Ser 1.29*; Potassium 5.0; Sodium 142    Lipid Panel    Component Value Date/Time   CHOL 163 12/09/2015 0219   TRIG 106 12/09/2015 0219   HDL 28* 12/09/2015 0219   CHOLHDL 5.8 12/09/2015 0219   VLDL 21 12/09/2015 0219   LDLCALC 114* 12/09/2015 0219      Wt Readings from Last 3 Encounters:  12/17/15 215 lb (97.523 kg)  12/10/15 212 lb 8 oz (96.389 kg)      ASSESSMENT AND PLAN:  1.CAD s/p STEMI: Status post drug-eluting stent to the proximal left anterior descending artery and thrombectomy.he is tolerating his medications without complaint.  I have offered to change his metoprolol from 12.5 mg twice a day to long-acting metoprolol 25 mg at at bedtime to assist with daytime fatigue.  He does not wish to make any changes on his medications at this time, he feels better, knowing that the medication may be causing some of this.  This may need to be readdressed on followup appointment with Dr.Varanasi.   In the interim, I have given him information concerning his catheterization results, I have printed copy of this is that he could see the diagram, I have spoken extensively with him concerning his medications to include Brilinta, and the need to take this daily.  I also explained to him the reasoning for decreasing his enalapril to 2.5 from 10 mg with the addition of beta blocker, which affects his blood pressure.  I have given him samples of 81 mg  enteric-coated aspirin, and he is to begin this medication and stopped taking 81 mg chewable tablet.  He will continue dual antiplatelet therapy as stated, along with statin therapy.  In the setting of frequent diarrhea, I am checking a BMET to evaluate potassium and kidney status.he is been offered cardiac rehabilitation, but refuses this time.  2. Diabetes:he has gone back to taking his diabetic medication, metformin, and glimepiride , both in the morning instead of one in the morning and one in noon as directed.  On discharge.  He is not having any ill effects from this, and therefore, will not advise any changes.  He is to follow up with his primary care physician for diabetes management.   Current medicines are reviewed at length with the patient today.  Labs/ tests ordered today include: BMET  Orders Placed This Encounter  Procedures  . Basic Metabolic Panel (BMET)     Disposition:   FU with Dr.Varanasi in 3 months.  Signed, Joni Reining, NP  12/17/2015 3:38 PM    [facility] [address], [place], [place] [address] Phone: [phone]; Fax: [phone]

## 2015-12-17 NOTE — Patient Instructions (Addendum)
Your physician recommends that you schedule a follow-up appointment in: 3 Months with Dr. Eldridge Dace   Your physician has recommended you make the following change in your medication:   Change Chewable Aspirin to Enteric Coated Aspirin  Your physician recommends that you return for lab work in: (BMET)  Thank you for choosing [facility]!

## 2015-12-17 NOTE — Progress Notes (Signed)
Name: Shane Welch    DOB: [birth year]  Age: 75 y.o.  MR#: [id number]       PCP:  Kaleen Mask, MD      Insurance: Payor: [employer] MEDICARE / Plan: [facility] MEDICARE / Product Type: *No Product type* /   CC:    Chief Complaint  Patient presents with  . Coronary Artery Disease    VS Filed Vitals:   12/17/15 1446  BP: 130/76  Pulse: 70  Height:  (1.803 m)  Weight: 215 lb (97.523 kg)  SpO2: 98%    Weights Current Weight  12/17/15 215 lb (97.523 kg)  12/10/15 212 lb 8 oz (96.389 kg)    Blood Pressure  BP Readings from Last 3 Encounters:  12/17/15 130/76  12/10/15 118/67     Admit date:  (Not on file) Last encounter with RMR:  Visit date not found   Allergy Sulfa antibiotics  Current Outpatient Prescriptions  Medication Sig Dispense Refill  . acetaminophen (TYLENOL) 325 MG tablet Take 650 mg by mouth every 6 (six) hours as needed for mild pain, moderate pain or headache.    Marland Kitchen aspirin 81 MG chewable tablet Chew 1 tablet (81 mg total) by mouth daily.    Marland Kitchen atorvastatin (LIPITOR) 80 MG tablet Take 1 tablet (80 mg total) by mouth daily at 6 PM. 90 tablet 3  . enalapril (VASOTEC) 2.5 MG tablet Take 1 tablet (2.5 mg total) by mouth daily. 90 tablet 3  . glimepiride (AMARYL) 2 MG tablet Take 2 mg by mouth daily at 12 noon.    . metFORMIN (GLUCOPHAGE) 1000 MG tablet Take 1,000 mg by mouth 2 (two) times daily.    . metoprolol tartrate (LOPRESSOR) 25 MG tablet Take 0.5 tablets (12.5 mg total) by mouth 2 (two) times daily. 90 tablet 3  . nitroGLYCERIN (NITROSTAT) 0.4 MG SL tablet Place 1 tablet (0.4 mg total) under the tongue every 5 (five) minutes as needed. 25 tablet 3  . ticagrelor (BRILINTA) 90 MG TABS tablet Take 1 tablet (90 mg total) by mouth 2 (two) times daily. 180 tablet 3   No current facility-administered medications for this visit.    Discontinued Meds:   There are no discontinued medications.  Patient Active Problem List   Diagnosis Date Noted  .  CAD (coronary artery disease) 12/10/2015  . Hyperlipidemia 12/10/2015  . Diabetes (HCC) 12/10/2015  . Acute MI anterior wall first episode care [facility])     LABS    Component Value Date/Time   NA 142 12/10/2015 0605   NA 142 12/09/2015 0219   NA 139 12/08/2015 1237   K 5.0 12/10/2015 0605   K 4.9 12/09/2015 0219   K 4.7 12/08/2015 1237   CL 107 12/10/2015 0605   CL 105 12/09/2015 0219   CL 103 12/08/2015 1237   CO2 26 12/10/2015 0605   CO2 25 12/09/2015 0219   CO2 18* 12/08/2015 1237   GLUCOSE 132* 12/10/2015 0605   GLUCOSE 102* 12/09/2015 0219   GLUCOSE 199* 12/08/2015 1237   BUN 19 12/10/2015 0605   BUN 23* 12/09/2015 0219   BUN 25* 12/08/2015 1237   CREATININE 1.29* 12/10/2015 0605   CREATININE 1.50* 12/09/2015 0219   CREATININE 1.62* 12/08/2015 1237   CALCIUM 9.6 12/10/2015 0605   CALCIUM 9.2 12/09/2015 0219   CALCIUM 9.4 12/08/2015 1237   GFRNONAA 53* 12/10/2015 0605   GFRNONAA 44* 12/09/2015 0219   GFRNONAA 40* 12/08/2015 1237   GFRAA >60 12/10/2015 4098   GFRAA  51* 12/09/2015 0219   GFRAA 47* 12/08/2015 1237   CMP     Component Value Date/Time   NA 142 12/10/2015 0605   K 5.0 12/10/2015 0605   CL 107 12/10/2015 0605   CO2 26 12/10/2015 0605   GLUCOSE 132* 12/10/2015 0605   BUN 19 12/10/2015 0605   CREATININE 1.29* 12/10/2015 0605   CALCIUM 9.6 12/10/2015 0605   PROT 6.9 12/08/2015 1237   ALBUMIN 3.7 12/08/2015 1237   AST 20 12/08/2015 1237   ALT 13* 12/08/2015 1237   ALKPHOS 51 12/08/2015 1237   BILITOT 0.4 12/08/2015 1237   GFRNONAA 53* 12/10/2015 0605   GFRAA >60 12/10/2015 0605       Component Value Date/Time   WBC 10.8* 12/09/2015 0219   WBC 15.9* 12/08/2015 1237   HGB 12.3* 12/09/2015 0219   HGB 12.9* 12/08/2015 1237   HGB 13.9 12/08/2015 1229   HCT 38.3* 12/09/2015 0219   HCT 39.5 12/08/2015 1237   HCT 41.0 12/08/2015 1229   MCV 87.8 12/09/2015 0219   MCV 86.8 12/08/2015 1237    Lipid Panel     Component Value Date/Time   CHOL 163  12/09/2015 0219   TRIG 106 12/09/2015 0219   HDL 28* 12/09/2015 0219   CHOLHDL 5.8 12/09/2015 0219   VLDL 21 12/09/2015 0219   LDLCALC 114* 12/09/2015 0219    ABG    Component Value Date/Time   TCO2 21 12/08/2015 1229     No results found for: TSH BNP (last 3 results) No results for input(s): BNP in the last 8760 hours.  ProBNP (last 3 results) No results for input(s): PROBNP in the last 8760 hours.  Cardiac Panel (last 3 results) No results for input(s): CKTOTAL, CKMB, TROPONINI, RELINDX in the last 72 hours.  Iron/TIBC/Ferritin/ %Sat No results found for: IRON, TIBC, FERRITIN, IRONPCTSAT   EKG Orders placed or performed during the hospital encounter of 12/08/15  . EKG 12-Lead immediately post procedure  . EKG 12-Lead  . EKG 12-Lead immediately post procedure  . EKG 12-Lead     Prior Assessment and Plan Problem List as of 12/17/2015      Cardiovascular and Mediastinum   Acute MI anterior wall first episode care [facility])   CAD (coronary artery disease)     Endocrine   Diabetes (HCC)     Other   Hyperlipidemia       Imaging: No results found.

## 2015-12-24 ENCOUNTER — Telehealth: Payer: Self-pay | Admitting: Adult Health

## 2015-12-24 NOTE — Telephone Encounter (Signed)
Patient stated that he took our lab orders and had his blood drawn at [facility] last week. He was calling to follow up on results since he hasn't heard anything. I told the patient that we will request those results from [facility] and call him with feedback ASAP.

## 2015-12-24 NOTE — Telephone Encounter (Signed)
Labs in [employer] NP's folder for review,pt aware

## 2015-12-26 ENCOUNTER — Telehealth: Payer: Self-pay | Admitting: Adult Health

## 2015-12-26 NOTE — Telephone Encounter (Signed)
Called pt to let him know as soon as his labs are looked at by Joni Reining, N.P.  And is resulted to [place] we will call him and let him know what the results are. He voiced understanding.

## 2015-12-26 NOTE — Telephone Encounter (Signed)
Pt is calling concerning his lab results

## 2015-12-27 ENCOUNTER — Telehealth: Payer: Self-pay | Admitting: Interventional Cardiology

## 2015-12-27 NOTE — Telephone Encounter (Signed)
142, potassium 5.0, chloride 107, CO2 26, BUN 19, creatinine 1.29, glucose 132,.    Creatinine has improved from prior lab work.  Potassium is normal.  Would not make any changes on this medication regimen.

## 2015-12-27 NOTE — Telephone Encounter (Signed)
New Message ° °Pt called for lab results  °

## 2015-12-27 NOTE — Telephone Encounter (Signed)
Called patient with test results. No answer. Left message to call back.  

## 2015-12-27 NOTE — Telephone Encounter (Signed)
Pt calling to receive results of resent labs. Labs scanned on 12/27/15. Please advise.

## 2015-12-28 NOTE — Telephone Encounter (Signed)
Patient notified of test results. Patient voiced understanding.

## 2016-03-11 ENCOUNTER — Encounter: Payer: Self-pay | Admitting: Interventional Cardiology

## 2016-03-11 ENCOUNTER — Ambulatory Visit (INDEPENDENT_AMBULATORY_CARE_PROVIDER_SITE_OTHER): Payer: Medicare Other | Admitting: Interventional Cardiology

## 2016-03-11 VITALS — BP 118/60 | HR 72 | Ht 71.0 in | Wt 217.0 lb

## 2016-03-11 DIAGNOSIS — I252 Old myocardial infarction: Secondary | ICD-10-CM | POA: Diagnosis not present

## 2016-03-11 DIAGNOSIS — I2510 Atherosclerotic heart disease of native coronary artery without angina pectoris: Secondary | ICD-10-CM

## 2016-03-11 DIAGNOSIS — E785 Hyperlipidemia, unspecified: Secondary | ICD-10-CM | POA: Diagnosis not present

## 2016-03-11 DIAGNOSIS — I2583 Coronary atherosclerosis due to lipid rich plaque: Principal | ICD-10-CM

## 2016-03-11 DIAGNOSIS — R931 Abnormal findings on diagnostic imaging of heart and coronary circulation: Secondary | ICD-10-CM | POA: Insufficient documentation

## 2016-03-11 HISTORY — DX: Old myocardial infarction: I25.2

## 2016-03-11 HISTORY — DX: Abnormal findings on diagnostic imaging of heart and coronary circulation: R93.1

## 2016-03-11 NOTE — Progress Notes (Signed)
Patient ID: Shane Welch, male   DOB: 1941-09-23, 75 y.o.   MRN: [id number]     Cardiology Office Note   Date:  03/11/2016   ID:  Shane Welch, DOB 11/13/1940, MRN [id number]  PCP:  Kaleen Mask, MD    No chief complaint on file. follow-up coronary artery disease   Wt Readings from Last 3 Encounters:  03/11/16 217 lb (98.431 kg)  12/17/15 215 lb (97.523 kg)  12/10/15 212 lb 8 oz (96.389 kg)       History of Present Illness: Shane Welch is a 75 y.o. 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 He underwent cardiac cath and a drug-eluting stent was placed in the mid LAD.  His ejection fraction was 40-45% with anterior hypokinesis of the time. He reports no bleeding problems. He has only had some mild fatigue. He can work for 30 minutes in the yard and then has to take a break. He feels this is less than what he was able to do before his heart attack.   He is planning on driving to [place]. He denies any symptoms like his prior MI. He has not used any nitroglycerin.    Past Medical History  Diagnosis Date  . Type II diabetes mellitus (HCC)     a. Dx ~ 2010.  Marland Kitchen CAD (coronary artery disease), native coronary artery   . Hyperlipidemia     Past Surgical History  Procedure Laterality Date  . Cervical spine surgery      a. ~ 2002.  . Right knee surgery      a. 08/2015.  . Cardiac catheterization N/A 12/08/2015    Procedure: Left Heart Cath and Coronary Angiography;  Surgeon: Corky Crafts, MD;  Location: [facility] INVASIVE CV LAB;  Service: Cardiovascular;  Laterality: N/A;  . Cardiac catheterization  12/08/2015    Procedure: Coronary Stent Intervention;  Surgeon: Corky Crafts, MD;  Location: [facility] INVASIVE CV LAB;  Service: Cardiovascular;;     Current Outpatient Prescriptions  Medication Sig Dispense Refill  . acetaminophen (TYLENOL) 325 MG tablet Take 650 mg by mouth every 6 (six) hours as needed for mild pain, moderate pain or headache.    Marland Kitchen aspirin EC 81 MG  tablet Take 1 tablet (81 mg total) by mouth daily. 90 tablet 3  . atorvastatin (LIPITOR) 80 MG tablet Take 1 tablet (80 mg total) by mouth daily at 6 PM. 90 tablet 3  . enalapril (VASOTEC) 2.5 MG tablet Take 1 tablet (2.5 mg total) by mouth daily. 90 tablet 3  . glimepiride (AMARYL) 2 MG tablet Take 2 mg by mouth daily at 12 noon.    . metFORMIN (GLUCOPHAGE) 1000 MG tablet Take 1,000 mg by mouth 2 (two) times daily.    . metoprolol tartrate (LOPRESSOR) 25 MG tablet Take 0.5 tablets (12.5 mg total) by mouth 2 (two) times daily. 90 tablet 3  . nitroGLYCERIN (NITROSTAT) 0.4 MG SL tablet Place 1 tablet (0.4 mg total) under the tongue every 5 (five) minutes as needed. 25 tablet 3  . ticagrelor (BRILINTA) 90 MG TABS tablet Take 1 tablet (90 mg total) by mouth 2 (two) times daily. 180 tablet 3   No current facility-administered medications for this visit.    Allergies:   Sulfa antibiotics    Social History:  The patient  reports that he has quit smoking. His smoking use included Cigarettes. He has a 30 pack-year smoking history. He does not have any smokeless tobacco history on file. He  reports that he drinks alcohol. He reports that he does not use illicit drugs.   Family History:  The patient's family history includes Cancer in his father; Dementia in his mother.    ROS:  Please see the history of present illness.   Otherwise, review of systems are positive for fatigue.   All other systems are reviewed and negative.    PHYSICAL EXAM: VS:  BP 118/60 mmHg  Pulse 72  Ht 5\' 11"  (1.803 m)  Wt 217 lb (98.431 kg)  BMI 30.28 kg/m2 , BMI Body mass index is 30.28 kg/(m^2). GEN: Well nourished, well developed, in no acute distress HEENT: normal Neck: no JVD, carotid bruits, or masses Cardiac: RRR; no murmurs, rubs, or gallops,no edema  Respiratory:  clear to auscultation bilaterally, normal work of breathing GI: soft, nontender, nondistended, + BS MS: no deformity or atrophy Skin: warm and dry,  no rash Neuro:  Strength and sensation are intact Psych: euthymic mood, full affect    Recent Labs: 12/08/2015: ALT 13* 12/09/2015: Hemoglobin 12.3*; Platelets 247 12/10/2015: BUN 19; Creatinine, Ser 1.29*; Potassium 5.0; Sodium 142   Lipid Panel    Component Value Date/Time   CHOL 163 12/09/2015 0219   TRIG 106 12/09/2015 0219   HDL 28* 12/09/2015 0219   CHOLHDL 5.8 12/09/2015 0219   VLDL 21 12/09/2015 0219   LDLCALC 114* 12/09/2015 0219     Other studies Reviewed: Additional studies/ records that were reviewed today with results demonstrating: cath records reviewed.   ASSESSMENT AND PLAN:  1. CAD: No angina. Continue aggressive secondary prevention. We went over the rationale behind all of his medications.  2. Hyperlipidemia: Continue atorvastatin. Will recheck lipids. May be able to decrease the dose of atorvastatin 40 mg daily if his LDL is very low. 3. Fatigue: We'll recheck echocardiogram to see if he has had resolution of his LV dysfunction. 4. Abnormal echo: as above.  No sx of CHF. 5.    Current medicines are reviewed at length with the patient today.  The patient concerns regarding his medicines were addressed.  The following changes have been made:  No change  Labs/ tests ordered today include: lipids , liver when fasting No orders of the defined types were placed in this encounter.    Recommend 150 minutes/week of aerobic exercise Low fat, low carb, high fiber diet recommended  Disposition:   FU in 6 months   Signed, Lance Muss, MD  03/11/2016 1:26 PM    [facility] [address] [place], [place], [place]  [address] Phone: [phone]; Fax: [phone]

## 2016-03-11 NOTE — Patient Instructions (Signed)
**Note De-Identified  Obfuscation** Medication Instructions:  Same-no changes  Labwork: Lipids and LFTs on same day as Echo. Please do not eat or drink after midnight the night before labs are drawn.  Testing/Procedures: Your physician has requested that you have an echocardiogram. Echocardiography is a painless test that uses sound waves to create images of your heart. It provides your doctor with information about the size and shape of your heart and how well your heart's chambers and valves are working. This procedure takes approximately one hour. There are no restrictions for this procedure.  Follow-Up: Your physician wants you to follow-up in: 6 months. You will receive a reminder letter in the mail two months in advance. If you don't receive a letter, please call our office to schedule the follow-up appointment.        If you need a refill on your cardiac medications before your next appointment, please call your pharmacy.

## 2016-03-26 ENCOUNTER — Ambulatory Visit (HOSPITAL_COMMUNITY): Payer: Medicare Other | Attending: Cardiology

## 2016-03-26 ENCOUNTER — Other Ambulatory Visit: Payer: Self-pay

## 2016-03-26 ENCOUNTER — Other Ambulatory Visit (INDEPENDENT_AMBULATORY_CARE_PROVIDER_SITE_OTHER): Payer: Medicare Other | Admitting: *Deleted

## 2016-03-26 DIAGNOSIS — E785 Hyperlipidemia, unspecified: Secondary | ICD-10-CM | POA: Diagnosis not present

## 2016-03-26 DIAGNOSIS — I517 Cardiomegaly: Secondary | ICD-10-CM | POA: Diagnosis not present

## 2016-03-26 DIAGNOSIS — Z87891 Personal history of nicotine dependence: Secondary | ICD-10-CM | POA: Insufficient documentation

## 2016-03-26 DIAGNOSIS — I2510 Atherosclerotic heart disease of native coronary artery without angina pectoris: Secondary | ICD-10-CM | POA: Insufficient documentation

## 2016-03-26 DIAGNOSIS — I77810 Thoracic aortic ectasia: Secondary | ICD-10-CM | POA: Diagnosis not present

## 2016-03-26 DIAGNOSIS — I2583 Coronary atherosclerosis due to lipid rich plaque: Secondary | ICD-10-CM

## 2016-03-26 DIAGNOSIS — E119 Type 2 diabetes mellitus without complications: Secondary | ICD-10-CM | POA: Diagnosis not present

## 2016-03-26 LAB — HEPATIC FUNCTION PANEL
ALT: 32 U/L (ref 9–46)
AST: 28 U/L (ref 10–35)
Albumin: 3.9 g/dL (ref 3.6–5.1)
Alkaline Phosphatase: 83 U/L (ref 40–115)
Bilirubin, Direct: 0.1 mg/dL
Indirect Bilirubin: 0.4 mg/dL (ref 0.2–1.2)
Total Bilirubin: 0.5 mg/dL (ref 0.2–1.2)
Total Protein: 6.5 g/dL (ref 6.1–8.1)

## 2016-03-26 LAB — LIPID PANEL
CHOL/HDL RATIO: 2.6 ratio (ref <=5.0)
Cholesterol: 82 mg/dL — ABNORMAL LOW (ref 125–200)
HDL: 31 mg/dL — AB (ref >=40)
LDL CALC: 35 mg/dL (ref <130)
TRIGLYCERIDES: 81 mg/dL (ref <150)
VLDL: 16 mg/dL (ref <30)

## 2016-08-19 ENCOUNTER — Encounter: Payer: Self-pay | Admitting: Interventional Cardiology

## 2016-09-01 NOTE — Progress Notes (Signed)
Patient ID: Shane Welch, male   DOB: 08-24-1941, 75 y.o.   MRN: [id number]     Cardiology Office Note   Date:  09/02/2016   ID:  Michael Dafoe, DOB [birth year], MRN [id number]  PCP:  Kaleen Mask, MD    No chief complaint on file. follow-up coronary artery disease   Wt Readings from Last 3 Encounters:  09/02/16 100.1 kg (220 lb 9.6 oz)  03/11/16 98.4 kg (217 lb)  12/17/15 97.5 kg (215 lb)       History of Present Illness: Shane Welch is a 75 y.o. 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 He underwent cardiac cath and a drug-eluting stent was placed in the mid LAD.  His ejection fraction was 40-45% with anterior hypokinesis of the time. He reports no bleeding problems. He has only had some mild fatigue. He can work for 30 minutes in the yard and then has to take a break. He feels this is less than what he was able to do before his heart attack.   He drove to and from [place]. He denies any symptoms like his prior MI. He has not used any nitroglycerin.  Repeat echo in 6/17 showed normal LV function.  No CP or SHOB like his MI.  He has had some sensation in his chest that is different fdrom his MI.  He tried a NTG with no relief.  No relation to exertion.  He does not think it is heart pain.   Bleeds easily.  Works around [employer].  No internal bleeding.  He does not do any dedicated exercise.  Only some work around the house.   Past Medical History:  Diagnosis Date  . CAD (coronary artery disease), native coronary artery   . Hyperlipidemia   . Type II diabetes mellitus (HCC)    a. Dx ~ 2010.    Past Surgical History:  Procedure Laterality Date  . CARDIAC CATHETERIZATION N/A 12/08/2015   Procedure: Left Heart Cath and Coronary Angiography;  Surgeon: Corky Crafts, MD;  Location: [facility] INVASIVE CV LAB;  Service: Cardiovascular;  Laterality: N/A;  . CARDIAC CATHETERIZATION  12/08/2015   Procedure: Coronary Stent Intervention;  Surgeon: Corky Crafts, MD;   Location: [facility] INVASIVE CV LAB;  Service: Cardiovascular;;  . CERVICAL SPINE SURGERY     a. ~ 2002.  . Right Knee Surgery     a. 08/2015.     Current Outpatient Prescriptions  Medication Sig Dispense Refill  . acetaminophen (TYLENOL) 325 MG tablet Take 650 mg by mouth every 6 (six) hours as needed for mild pain, moderate pain or headache.    Marland Kitchen aspirin EC 81 MG tablet Take 1 tablet (81 mg total) by mouth daily. 90 tablet 3  . atorvastatin (LIPITOR) 80 MG tablet Take 1 tablet (80 mg total) by mouth daily at 6 PM. 90 tablet 3  . B Complex Vitamins (B COMPLEX-B12 PO) Take 15-17 drops by mouth daily.    . enalapril (VASOTEC) 2.5 MG tablet Take 1 tablet (2.5 mg total) by mouth daily. 90 tablet 3  . glimepiride (AMARYL) 2 MG tablet Take 2 mg by mouth daily at 12 noon.    . metFORMIN (GLUCOPHAGE) 1000 MG tablet Take 1,000 mg by mouth 2 (two) times daily.    . metoprolol tartrate (LOPRESSOR) 25 MG tablet Take 0.5 tablets (12.5 mg total) by mouth 2 (two) times daily. 90 tablet 3  . nitroGLYCERIN (NITROSTAT) 0.4 MG SL tablet Place 0.4 mg under the  tongue every 5 (five) minutes as needed for chest pain (MAX 3 TABLETS IN 15 MINS).    Marland Kitchen. ticagrelor (BRILINTA) 90 MG TABS tablet Take 1 tablet (90 mg total) by mouth 2 (two) times daily. 180 tablet 3   No current facility-administered medications for this visit.     Allergies:   Sulfa antibiotics    Social History:  The patient  reports that he has quit smoking. His smoking use included Cigarettes. He has a 30.00 pack-year smoking history. He has never used smokeless tobacco. He reports that he drinks alcohol. He reports that he does not use drugs.   Family History:  The patient's family history includes Cancer in his father; Dementia in his mother.    ROS:  Please see the history of present illness.   Otherwise, review of systems are positive for fatigue.   All other systems are reviewed and negative.    PHYSICAL EXAM: VS:  BP 124/84   Pulse 66    Ht 5\' 11"  (1.803 m)   Wt 100.1 kg (220 lb 9.6 oz)   BMI 30.77 kg/m  , BMI Body mass index is 30.77 kg/m. GEN: Well nourished, well developed, in no acute distress  HEENT: normal  Neck: no JVD, carotid bruits, or masses Cardiac: RRR; no murmurs, rubs, or gallops,no edema  Respiratory:  clear to auscultation bilaterally, normal work of breathing GI: soft, nontender, nondistended, + BS MS: no deformity or atrophy  Skin: warm and dry, no rash Neuro:  Strength and sensation are intact Psych: euthymic mood, full affect    Recent Labs: 12/09/2015: Hemoglobin 12.3; Platelets 247 12/10/2015: BUN 19; Creatinine, Ser 1.29; Potassium 5.0; Sodium 142 03/26/2016: ALT 32   Lipid Panel    Component Value Date/Time   CHOL 82 (L) 03/26/2016 0959   TRIG 81 03/26/2016 0959   HDL 31 (L) 03/26/2016 0959   CHOLHDL 2.6 03/26/2016 0959   VLDL 16 03/26/2016 0959   LDLCALC 35 03/26/2016 0959     Other studies Reviewed: Additional studies/ records that were reviewed today with results demonstrating: cath records reviewed.   ASSESSMENT AND PLAN:  1. CAD: No angina. Continue aggressive secondary prevention. Could change Brilinta to clopidogrel in February 2018, since that will be a year. This may help with some of the nuisance bleeding.  2. Hyperlipidemia: Continue atorvastatin. Will recheck lipids in 3 months. Decrease the dose of atorvastatin 40 mg daily since his LDL is very low on 80 mg. 3. Fatigue: Increase exercise duration as below. 4. Abnormal  From Old MI: resolved.  No CHF.  5. HTN:  BP has been well controlled.  COntinue ACE-I.    Current medicines are reviewed at length with the patient today.  The patient concerns regarding his medicines were addressed.  The following changes have been made:  No change  Labs/ tests ordered today include: lipids , liver when fasting No orders of the defined types were placed in this encounter.   Recommend 150 minutes/week of aerobic exercise Low  fat, low carb, high fiber diet recommended  Disposition:   FU in 12 months   Signed, Lance MussJayadeep Agustine Rossitto, MD  09/02/2016 11:19 AM    [facility] [address], [place], [place]  [address] Phone: [phone]; Fax: [phone]

## 2016-09-02 ENCOUNTER — Encounter (INDEPENDENT_AMBULATORY_CARE_PROVIDER_SITE_OTHER): Payer: Self-pay

## 2016-09-02 ENCOUNTER — Encounter: Payer: Self-pay | Admitting: Interventional Cardiology

## 2016-09-02 ENCOUNTER — Ambulatory Visit (INDEPENDENT_AMBULATORY_CARE_PROVIDER_SITE_OTHER): Payer: Medicare Other | Admitting: Interventional Cardiology

## 2016-09-02 VITALS — BP 124/84 | HR 66 | Ht 71.0 in | Wt 220.6 lb

## 2016-09-02 DIAGNOSIS — I2510 Atherosclerotic heart disease of native coronary artery without angina pectoris: Secondary | ICD-10-CM

## 2016-09-02 DIAGNOSIS — E782 Mixed hyperlipidemia: Secondary | ICD-10-CM | POA: Diagnosis not present

## 2016-09-02 DIAGNOSIS — I252 Old myocardial infarction: Secondary | ICD-10-CM

## 2016-09-02 DIAGNOSIS — I10 Essential (primary) hypertension: Secondary | ICD-10-CM

## 2016-09-02 DIAGNOSIS — I2583 Coronary atherosclerosis due to lipid rich plaque: Secondary | ICD-10-CM

## 2016-09-02 MED ORDER — ATORVASTATIN CALCIUM 40 MG PO TABS: 40.0000 mg | ORAL_TABLET | Freq: Every day | ORAL | 3 refills | Status: DC

## 2016-09-02 NOTE — Patient Instructions (Signed)
Medication Instructions:  Decrease Atorvastatin to 40 mg daily. All other medications remain th same.  Labwork: Lipids and CMET in 3 months. Please do not eaty or drink after midnight the night prior to lab draw.  Testing/Procedures: None  Follow-Up: Your physician wants you to follow-up in: 1 year. You will receive a reminder letter in the mail two months in advance. If you don't receive a letter, please call our office to schedule the follow-up appointment.     If you need a refill on your cardiac medications before your next appointment, please call your pharmacy.

## 2016-09-08 ENCOUNTER — Other Ambulatory Visit: Payer: Self-pay | Admitting: Interventional Cardiology

## 2016-09-08 MED ORDER — TICAGRELOR 90 MG PO TABS: 90.0000 mg | ORAL_TABLET | Freq: Two times a day (BID) | ORAL | 3 refills | Status: DC

## 2016-10-11 ENCOUNTER — Other Ambulatory Visit: Payer: Self-pay | Admitting: Physician Assistant

## 2016-10-14 NOTE — Telephone Encounter (Signed)
Review for refill. 

## 2016-11-17 ENCOUNTER — Other Ambulatory Visit: Payer: Self-pay | Admitting: Physician Assistant

## 2016-11-17 NOTE — Telephone Encounter (Signed)
Rx refill sent to pharmacy. 

## 2016-11-17 NOTE — Telephone Encounter (Signed)
Refill Request.  

## 2016-12-01 ENCOUNTER — Encounter (INDEPENDENT_AMBULATORY_CARE_PROVIDER_SITE_OTHER): Payer: Self-pay

## 2016-12-01 ENCOUNTER — Other Ambulatory Visit: Payer: Medicare Other | Admitting: *Deleted

## 2016-12-01 DIAGNOSIS — E782 Mixed hyperlipidemia: Secondary | ICD-10-CM

## 2016-12-01 LAB — LIPID PANEL
CHOLESTEROL TOTAL: 107 mg/dL (ref 100–199)
Chol/HDL Ratio: 3 ratio units (ref 0.0–5.0)
HDL: 36 mg/dL — ABNORMAL LOW (ref >39)
LDL Calculated: 48 mg/dL (ref 0–99)
Triglycerides: 117 mg/dL (ref 0–149)
VLDL Cholesterol Cal: 23 mg/dL (ref 5–40)

## 2016-12-01 LAB — COMPREHENSIVE METABOLIC PANEL
ALBUMIN: 4.1 g/dL (ref 3.5–4.8)
ALK PHOS: 48 IU/L (ref 39–117)
ALT: 17 IU/L (ref 0–44)
AST: 18 IU/L (ref 0–40)
Albumin/Globulin Ratio: 2.1 (ref 1.2–2.2)
BILIRUBIN TOTAL: 0.3 mg/dL (ref 0.0–1.2)
BUN / CREAT RATIO: 13 (ref 10–24)
BUN: 17 mg/dL (ref 8–27)
CHLORIDE: 103 mmol/L (ref 96–106)
CO2: 19 mmol/L (ref 18–29)
Calcium: 8.3 mg/dL — ABNORMAL LOW (ref 8.6–10.2)
Creatinine, Ser: 1.36 mg/dL — ABNORMAL HIGH (ref 0.76–1.27)
GFR calc Af Amer: 58 mL/min/{1.73_m2} — ABNORMAL LOW (ref >59)
GFR calc non Af Amer: 51 mL/min/{1.73_m2} — ABNORMAL LOW (ref >59)
GLOBULIN, TOTAL: 2 g/dL (ref 1.5–4.5)
Glucose: 178 mg/dL — ABNORMAL HIGH (ref 65–99)
POTASSIUM: 4.7 mmol/L (ref 3.5–5.2)
SODIUM: 141 mmol/L (ref 134–144)
Total Protein: 6.1 g/dL (ref 6.0–8.5)

## 2016-12-01 NOTE — Addendum Note (Signed)
Addended by: Tonita Phoenix on: 12/01/2016 08:05 AM   Modules accepted: Orders

## 2016-12-03 ENCOUNTER — Other Ambulatory Visit: Payer: Self-pay

## 2016-12-03 MED ORDER — CLOPIDOGREL BISULFATE 75 MG PO TABS: ORAL_TABLET | ORAL | 11 refills | Status: DC

## 2017-01-25 ENCOUNTER — Other Ambulatory Visit: Payer: Self-pay | Admitting: Physician Assistant

## 2017-01-26 NOTE — Telephone Encounter (Signed)
Refill Request.  

## 2017-02-28 ENCOUNTER — Other Ambulatory Visit: Payer: Self-pay | Admitting: Interventional Cardiology

## 2017-03-02 NOTE — Telephone Encounter (Signed)
Medication Detail    Disp Refills Start End   metoprolol tartrate (LOPRESSOR) 25 MG tablet 90 tablet 3 01/26/2017    Sig: TAKE ONE-HALF TABLET BY MOUTH TWICE DAILY   E-Prescribing Status: Receipt confirmed by pharmacy (01/26/2017 1:17 PM EDT)   Pharmacy   [id number] - [place], [place] - [address]

## 2017-04-05 ENCOUNTER — Other Ambulatory Visit: Payer: Self-pay | Admitting: Interventional Cardiology

## 2017-08-21 ENCOUNTER — Encounter: Payer: Self-pay | Admitting: Interventional Cardiology

## 2017-09-02 NOTE — Progress Notes (Signed)
Cardiology Office Note   Date:  09/03/2017   ID:  Shane RiggerJames Saavedra, DOB [birth year], MRN [id number]  PCP:  Kaleen MaskElkins, Wilson Oliver, MD    No chief complaint on file. CAD   Wt Readings from Last 3 Encounters:  09/03/17 224 lb 3.2 oz (101.7 kg)  09/02/16 220 lb 9.6 oz (100.1 kg)  03/11/16 217 lb (98.4 kg)       History of Present Illness: Shane Welch is a 76 y.o. 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 He underwent cardiac cath and a drug-eluting stent was placed in the mid LAD.  His ejection fraction was 40-45% with anterior hypokinesis of the time.  Repeat echo in 6/17 showed normal LV function.  He had a trip to [place] since the last visit.    Working in the yard is his most strenuous activity.  [facility].  Denies : Chest pain. Dizziness. Leg edema. Nitroglycerin use. Orthopnea. Palpitations. Paroxysmal nocturnal dyspnea. Shortness of breath. Syncope.   No sx like his prior MI.   Wants to stop some medicine if he can.    Past Medical History:  Diagnosis Date  . CAD (coronary artery disease), native coronary artery   . Hyperlipidemia   . Type II diabetes mellitus (HCC)    a. Dx ~ 2010.    Past Surgical History:  Procedure Laterality Date  . CARDIAC CATHETERIZATION N/A 12/08/2015   Procedure: Left Heart Cath and Coronary Angiography;  Surgeon: Corky CraftsJayadeep S Ionia Schey, MD;  [facility];  Service: Cardiovascular;  Laterality: N/A;  . CARDIAC CATHETERIZATION  12/08/2015   Procedure: Coronary Stent Intervention;  Surgeon: Corky CraftsJayadeep S Diante Barley, MD;  [facility];  Service: Cardiovascular;;  . CERVICAL SPINE SURGERY     a. ~ 2002.  . Right Knee Surgery     a. 08/2015.     Current Outpatient Medications  Medication Sig Dispense Refill  . acetaminophen (TYLENOL) 325 MG tablet Take 650 mg by mouth every 6 (six) hours as needed for mild pain, moderate pain or headache.    Marland Kitchen. atorvastatin (LIPITOR) 40 MG tablet  Take 1 tablet (40 mg total) by mouth daily. 90 tablet 3  . B Complex Vitamins (B COMPLEX-B12 PO) Take 15-17 drops by mouth daily.    . enalapril (VASOTEC) 2.5 MG tablet TAKE ONE TABLET BY MOUTH ONCE DAILY 90 tablet 3  . glimepiride (AMARYL) 2 MG tablet Take 2 mg by mouth daily at 12 noon.    . metFORMIN (GLUCOPHAGE) 1000 MG tablet Take 1,000 mg by mouth 2 (two) times daily.    . metoprolol tartrate (LOPRESSOR) 25 MG tablet TAKE ONE-HALF TABLET BY MOUTH TWICE DAILY 90 tablet 3  . metoprolol tartrate (LOPRESSOR) 25 MG tablet TAKE ONE-HALF TABLET BY MOUTH TWICE DAILY (PLEASE  SCHEDULE  APPT  FOR  REFILLS) 30 tablet 6  . nitroGLYCERIN (NITROSTAT) 0.4 MG SL tablet Place 0.4 mg under the tongue every 5 (five) minutes as needed for chest pain (MAX 3 TABLETS IN 15 MINS).    Marland Kitchen. ticagrelor (BRILINTA) 90 MG TABS tablet Take 90 mg 2 (two) times daily by mouth.     No current facility-administered medications for this visit.     Allergies:   Sulfa antibiotics    Social History:  The patient  reports that he has quit smoking. His smoking use included cigarettes. He has a 30.00 pack-year smoking history. he has never used smokeless  tobacco. He reports that he drinks alcohol. He reports that he does not use drugs.   Family History:  The patient's family history includes Cancer in his father; Dementia in his mother.    ROS:  Please see the history of present illness.   Otherwise, review of systems are positive for less exercise, weight gain- no recent bleeding.   All other systems are reviewed and negative.    PHYSICAL EXAM: VS:  BP 120/72   Pulse 67   Ht 5\' 11"  (1.803 m)   Wt 224 lb 3.2 oz (101.7 kg)   SpO2 98%   BMI 31.27 kg/m  , BMI Body mass index is 31.27 kg/m. GEN: Well nourished, well developed, in no acute distress  HEENT: normal  Neck: no JVD, carotid bruits, or masses Cardiac: RRR; no murmurs, rubs, or gallops,no edema  Respiratory:  clear to auscultation bilaterally, normal work of  breathing GI: soft, nontender, nondistended, + BS MS: no deformity or atrophy  Skin: warm and dry, no rash Neuro:  Strength and sensation are intact Psych: euthymic mood, full affect   EKG:   The ekg ordered today demonstrates NSR, sinus arrhythmia   Recent Labs: 12/01/2016: ALT 17; BUN 17; Creatinine, Ser 1.36; Potassium 4.7; Sodium 141   Lipid Panel    Component Value Date/Time   CHOL 107 12/01/2016 0806   TRIG 117 12/01/2016 0806   HDL 36 (L) 12/01/2016 0806   CHOLHDL 3.0 12/01/2016 0806   CHOLHDL 2.6 03/26/2016 0959   VLDL 16 03/26/2016 0959   LDLCALC 48 12/01/2016 0806     Other studies Reviewed: Additional studies/ records that were reviewed today with results demonstrating: labs reviewed.   ASSESSMENT AND PLAN:  1. CAD: DES to the LAD in 2017.  Stop Brilinta.  Start Plavix 75 mg daily.  We discussed Plavix vs. Aspirin.  Given his history, will plan for plavix monotherapy 2. Hyperlipidemia:  Lipids well controlled. Continue atorvastatin.  LDL 48 in 2/18. 3. HTN: Well controlled.  OK to stop metoprolol at this time.  LV function normal and he wants to stop some meds.   4. Old MI:  NO CHF sx.  5. DM: A1C 6.8 per his report. Increase exercise.  Obtain labs from Dr. Jeannetta Nap.   Current medicines are reviewed at length with the patient today.  The patient concerns regarding his medicines were addressed.  The following changes have been made:  OK to stop metoprolol  Labs/ tests ordered today include:  No orders of the defined types were placed in this encounter.   Recommend 150 minutes/week of aerobic exercise Low fat, low carb, high fiber diet recommended  Disposition:   FU in 1 year   Signed, Lance Muss, MD  09/03/2017 8:44 AM    [facility] [address] [place], [place], [place]  [address] Phone: [phone]; Fax: [phone]

## 2017-09-03 ENCOUNTER — Encounter: Payer: Self-pay | Admitting: Interventional Cardiology

## 2017-09-03 ENCOUNTER — Ambulatory Visit: Payer: Medicare Other | Admitting: Interventional Cardiology

## 2017-09-03 VITALS — BP 120/72 | HR 67 | Ht 71.0 in | Wt 224.2 lb

## 2017-09-03 DIAGNOSIS — I252 Old myocardial infarction: Secondary | ICD-10-CM | POA: Diagnosis not present

## 2017-09-03 DIAGNOSIS — E1159 Type 2 diabetes mellitus with other circulatory complications: Secondary | ICD-10-CM

## 2017-09-03 DIAGNOSIS — I2583 Coronary atherosclerosis due to lipid rich plaque: Secondary | ICD-10-CM | POA: Diagnosis not present

## 2017-09-03 DIAGNOSIS — I10 Essential (primary) hypertension: Secondary | ICD-10-CM | POA: Insufficient documentation

## 2017-09-03 DIAGNOSIS — E782 Mixed hyperlipidemia: Secondary | ICD-10-CM

## 2017-09-03 DIAGNOSIS — I2510 Atherosclerotic heart disease of native coronary artery without angina pectoris: Secondary | ICD-10-CM | POA: Diagnosis not present

## 2017-09-03 HISTORY — DX: Essential (primary) hypertension: I10

## 2017-09-03 MED ORDER — CLOPIDOGREL BISULFATE 75 MG PO TABS: 75.0000 mg | ORAL_TABLET | Freq: Every day | ORAL | 3 refills | Status: DC

## 2017-09-03 NOTE — Patient Instructions (Signed)
Medication Instructions:  Your physician has recommended you make the following change in your medication:   1. STOP Metoprolol  2. STOP Brilinta  3. START clopidogrel (plavix) 75 mg daily  Labwork: We are requesting a copy of your most recent labs from your primary care physician  Testing/Procedures: None ordered  Follow-Up: Your physician wants you to follow-up in: 1 year with Dr. Eldridge Dace. You will receive a reminder letter in the mail two months in advance. If you don't receive a letter, please call our office to schedule the follow-up appointment.   Any Other Special Instructions Will Be Listed Below (If Applicable).     If you need a refill on your cardiac medications before your next appointment, please call your pharmacy.

## 2017-11-21 ENCOUNTER — Other Ambulatory Visit: Payer: Self-pay | Admitting: Physician Assistant

## 2017-11-21 ENCOUNTER — Other Ambulatory Visit: Payer: Self-pay | Admitting: Interventional Cardiology

## 2017-11-23 NOTE — Telephone Encounter (Signed)
Refill Request.  

## 2018-08-24 ENCOUNTER — Other Ambulatory Visit: Payer: Self-pay | Admitting: Interventional Cardiology

## 2018-09-06 DIAGNOSIS — I77810 Thoracic aortic ectasia: Secondary | ICD-10-CM

## 2018-09-06 HISTORY — DX: Thoracic aortic ectasia: I77.810

## 2018-09-06 NOTE — Progress Notes (Signed)
Cardiology Office Note    Date:  09/07/2018   ID:  Shane Welch, DOB 1941-07-03, MRN [id number]  PCP:  Kaleen Mask, MD  Cardiologist: Lance Muss, MD EPS: None  Chief Complaint  Patient presents with  . Follow-up    History of Present Illness:  Shane Welch is a 77 y.o. male with history of CAD status post anterior MI 11/2015 treated with DES to the mid LAD.  Ischemic cardiomyopathy ejection fraction 40 to 45% with anterior hypokinesis.  Repeat echo 03/2016 normal LV function.  Also has essential hypertension, HLD and DM type II.   Last saw Dr. Eldridge Dace 08/2017 and Brilinta changed to Plavix.  Aspirin stopped.  Patient comes in for yearly f/u. Says he never got his stamina back since his MI. Can only mow 1/2 his lawn before he gets tired and has to rest.  This is about the same since his MI.  Has not gotten any worse.  No chest pain, dyspnea, dyspnea on exertion, dizziness or presyncope.Had extensive blood work by Dr. Jeannetta Nap in Sept. Not taking Plavix-never started.  No regular exercise.  Walks 1 to 2 miles daily just based on his steps.    Past Medical History:  Diagnosis Date  . Abnormal echocardiogram 03/11/2016  . Acute MI anterior wall first episode care [facility])   . CAD (coronary artery disease) 12/10/2015  . CAD (coronary artery disease), native coronary artery   . Diabetes (HCC) 12/10/2015  . Essential hypertension 09/03/2017  . Hyperlipidemia   . 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 Type II diabetes mellitus (HCC)    a. Dx ~ 2010.    Past Surgical History:  Procedure Laterality Date  . CARDIAC CATHETERIZATION N/A 12/08/2015   Procedure: Left Heart Cath and Coronary Angiography;  Surgeon: Corky Crafts, MD;  Location: [facility] INVASIVE CV LAB;  Service: Cardiovascular;  Laterality: N/A;  . CARDIAC CATHETERIZATION  12/08/2015   Procedure: Coronary Stent Intervention;  Surgeon: Corky Crafts, MD;  Location: [facility] INVASIVE CV LAB;  Service:  Cardiovascular;;  . CERVICAL SPINE SURGERY     a. ~ 2002.  . Right Knee Surgery     a. 08/2015.    Current Medications: Current Meds  Medication Sig  . acetaminophen (TYLENOL) 325 MG tablet Take 650 mg by mouth every 6 (six) hours as needed for mild pain, moderate pain or headache.  Marland Kitchen aspirin EC 81 MG tablet Take 81 mg by mouth daily.  Marland Kitchen atorvastatin (LIPITOR) 40 MG tablet Take 1 tablet (40 mg total) by mouth daily at 6 PM.  . B Complex Vitamins (B COMPLEX-B12 PO) Take 15-17 drops by mouth daily.  . clopidogrel (PLAVIX) 75 MG tablet Take 1 tablet (75 mg total) daily by mouth.  . enalapril (VASOTEC) 2.5 MG tablet Take 1 tablet (2.5 mg total) by mouth daily. Please keep upcoming appt in November before anymore refills. Thank you  . glimepiride (AMARYL) 2 MG tablet Take 2 mg by mouth daily at 12 noon.  . metFORMIN (GLUCOPHAGE) 1000 MG tablet Take 1,000 mg by mouth 2 (two) times daily.  . nitroGLYCERIN (NITROSTAT) 0.4 MG SL tablet Place 0.4 mg under the tongue every 5 (five) minutes as needed for chest pain (MAX 3 TABLETS IN 15 MINS).  . [DISCONTINUED] metoprolol tartrate (LOPRESSOR) 25 MG tablet TAKE ONE-HALF TABLET BY MOUTH TWICE DAILY (PLEASE  SCHEDULE  APPT  FOR  REFILLS)     Allergies:   Sulfa antibiotics   Social History   Socioeconomic  History  . Marital status: Married    Spouse name: Not on file  . Number of children: Not on file  . Years of education: Not on file  . Highest education level: Not on file  Occupational History  . Not on file  Social Needs  . Financial resource strain: Not on file  . Food insecurity:    Worry: Not on file    Inability: Not on file  . Transportation needs:    Medical: Not on file    Non-medical: Not on file  Tobacco Use  . Smoking status: Former Smoker    Packs/day: 1.00    Years: 30.00    Pack years: 30.00    Types: Cigarettes  . Smokeless tobacco: Never Used  . Tobacco comment: quit 1993  Substance and Sexual Activity  . Alcohol  use: Yes    Alcohol/week: 0.0 standard drinks    Comment: rare drink  . Drug use: No  . Sexual activity: Not on file  Lifestyle  . Physical activity:    Days per week: Not on file    Minutes per session: Not on file  . Stress: Not on file  Relationships  . Social connections:    Talks on phone: Not on file    Gets together: Not on file    Attends religious service: Not on file    Active member of club or organization: Not on file    Attends meetings of clubs or organizations: Not on file    Relationship status: Not on file  Other Topics Concern  . Not on file  Social History Narrative   Retired Geophysicist/field seismologist.  Following retirement, he moved to [place], [place], where he has lived with his wife.  His wife was recently Dx with bladder CA and they are in the process of moving back to [place], [place] so that she may received treatment @ [facility].  He has been very active.  Enjoys fishing and the outdoors.     Family History:  The patient's family history includes Cancer in his father; Dementia in his mother.   ROS:   Please see the history of present illness.    Review of Systems  Constitution: Positive for malaise/fatigue.  HENT: Negative.   Cardiovascular: Negative.   Respiratory: Negative.   Endocrine: Negative.   Hematologic/Lymphatic: Negative.   Musculoskeletal: Negative.   Gastrointestinal: Negative.   Genitourinary: Negative.   Neurological: Negative.    All other systems reviewed and are negative.   PHYSICAL EXAM:   VS:  BP 128/76   Pulse 73   Ht 5\' 11"  (1.803 m)   Wt 232 lb (105.2 kg)   SpO2 97%   BMI 32.36 kg/m   Physical Exam  GEN: Well nourished, well developed, in no acute distress  Neck: no JVD, carotid bruits, or masses Cardiac:RRR; no murmurs, rubs, or gallops  Respiratory:  clear to auscultation bilaterally, normal work of breathing GI: soft, nontender, nondistended, + BS Ext: without cyanosis, clubbing, or edema, Good distal pulses bilaterally Neuro:  Alert  and Oriented x 3 Psych: euthymic mood, full affect  Wt Readings from Last 3 Encounters:  09/07/18 232 lb (105.2 kg)  09/03/17 224 lb 3.2 oz (101.7 kg)  09/02/16 220 lb 9.6 oz (100.1 kg)      Studies/Labs Reviewed:   EKG:  EKG is  ordered today.  The ekg ordered today demonstrates normal sinus rhythm, left anterior fascicular block, no acute change  Recent Labs: No results found  for requested labs within last 8760 hours.   Lipid Panel    Component Value Date/Time   CHOL 107 12/01/2016 0806   TRIG 117 12/01/2016 0806   HDL 36 (L) 12/01/2016 0806   CHOLHDL 3.0 12/01/2016 0806   CHOLHDL 2.6 03/26/2016 0959   VLDL 16 03/26/2016 0959   LDLCALC 48 12/01/2016 0806    Additional studies/ records that were reviewed today include:  2D echo 6/2017Study Conclusions   - Left ventricle: The cavity size was normal. Wall thickness was   increased in a pattern of mild LVH. The estimated ejection   fraction was 55%. Wall motion was normal; there were no regional   wall motion abnormalities. Doppler parameters are consistent with   abnormal left ventricular relaxation (grade 1 diastolic   dysfunction). - Aortic valve: There was no stenosis. - Aorta: Aortic root dimension: 42 mm (ED). Ascending aortic   diameter: 42 mm (S). - Aortic root: The aortic root was mildly dilated. - Ascending aorta: The ascending aorta was mildly dilated. - Mitral valve: There was no significant regurgitation. - Left atrium: The atrium was mildly dilated. - Right ventricle: The cavity size was normal. Systolic function   was normal. - Right atrium: The atrium was mildly dilated. - Pulmonary arteries: No complete TR doppler jet so unable to   estimate PA systolic pressure. - Inferior vena cava: The vessel was normal in size. The   respirophasic diameter changes were in the normal range (= 50%),   consistent with normal central venous pressure.   Impressions:   - Normal LV size with mild LV hypertrophy. EF  55%. Normal RV size   and systolic function. No significant valvular abnormalities.   Mildly dilated ascending aorta and aortic root.     Cardiac cath 2/2017Mid LAD lesion, 99% stenosed. Post intervention with aspiration thrombectomy and a 3.0 x 20 Promus drug-eluting stent postdilated to 3.5 mm, there is a 0% residual stenosis.  Normal LVEDP. Left ventricular gram not done because wants to avoid excessive contrast exposure.  From the right wrist, an EBU 3.5 guide catheter may better engage the left main a future procedure is needed.  Left dominant system. Non-dominant RCA was no selectively injected.   Continue dual antiplatelet therapy for at least a year. He'll need aggressive secondary prevention and diabetes control. Continue IV tirofiban for 4 hours. He'll be watched in the ICU. I suspect to be in the hospital for 2 days. Assess left ventricular function with echocardiogram.   Beta blocker, aspirin, Brilinta and statin. Hold metformin for 48 hours at least. We'll need to see when creatinine is in the normal range.      ASSESSMENT:    1. Coronary artery disease due to lipid rich plaque   2. Essential hypertension   3. Mixed hyperlipidemia   4. Type 2 diabetes mellitus with other circulatory complication, without long-term current use of insulin (HCC)   5. Ascending aorta dilatation (HCC)   6. Fatigue, unspecified type   7. Ischemic cardiomyopathy      PLAN:  In order of problems listed above:  CAD status post anterior wall MI 11/2015 treated with DES to the LAD.  LVEF 45% normalized on follow-up echo 03/2016.  Patient complains of fatigue and decreased exercise tolerance.  Had extensive blood work by PCP and we will try to obtain.  Repeat 2D echo to make sure LV function is normal.  Recommend 30 minutes of exercise daily to increase his tolerance.  Patient never started  Plavix last year and is only taken an aspirin once a day.  Discussed with Dr. Eldridge Dace who does recommend  Plavix over aspirin.  Patient does not want to change his medications and states he is not a medication taker.  He will remain on aspirin alone.  Essential hypertension blood pressure well controlled on low-dose enalapril  Hyperlipidemia on Lipitor.  Fasting lipid panel done by PCP in September  DM type II managed by PCP.  Ascending aortic dilatation 42 mm and dilated aortic root 42 mm mild on echo 03/2016 we will repeat 2D echo-to be done in [place].  History of ischemic cardiomyopathy ejection fraction 45% after MI but normalized.  Repeating echo because of increased fatigue.    Medication Adjustments/Labs and Tests Ordered: Current medicines are reviewed at length with the patient today.  Concerns regarding medicines are outlined above.  Medication changes, Labs and Tests ordered today are listed in the Patient Instructions below. Patient Instructions  Medication Instructions:  Your physician recommends that you continue on your current medications as directed. Please refer to the Current Medication list given to you today.  If you need a refill on your cardiac medications before your next appointment, please call your pharmacy.   Lab work: None  If you have labs (blood work) drawn today and your tests are completely normal, you will receive your results only by: Marland Kitchen MyChart Message (if you have MyChart) OR . A paper copy in the mail If you have any lab test that is abnormal or we need to change your treatment, we will call you to review the results.  Testing/Procedures: Your physician has requested that you have an echocardiogram. Echocardiography is a painless test that uses sound waves to create images of your heart. It provides your doctor with information about the size and shape of your heart and how well your heart's chambers and valves are working. This procedure takes approximately one hour. There are no restrictions for this procedure.    Follow-Up: At [facility], you  and your health needs are our priority.  As part of our continuing mission to provide you with exceptional heart care, we have created designated Provider Care Teams.  These Care Teams include your primary Cardiologist (physician) and Advanced Practice Providers (APPs -  Physician Assistants and Nurse Practitioners) who all work together to provide you with the care you need, when you need it. You will need a follow up appointment in 1 year.  Please call our office 2 months in advance to schedule this appointment.  You may see Lance Muss, MD or one of the following Advanced Practice Providers on your designated Care Team:   [place], PA-C Ronie Spies, PA-C . Jacolyn Reedy, PA-C  Any Other Special Instructions Will Be Listed Below (If Applicable).       Elson Clan, PA-C  09/07/2018 8:34 AM    [facility] [address] [place], [place], [place]  [address] Phone: [phone]; Fax: [phone]

## 2018-09-07 ENCOUNTER — Ambulatory Visit: Payer: Medicare Other | Admitting: Physician Assistant

## 2018-09-07 ENCOUNTER — Encounter: Payer: Self-pay | Admitting: Physician Assistant

## 2018-09-07 ENCOUNTER — Encounter (INDEPENDENT_AMBULATORY_CARE_PROVIDER_SITE_OTHER): Payer: Self-pay

## 2018-09-07 VITALS — BP 128/76 | HR 73 | Ht 71.0 in | Wt 232.0 lb

## 2018-09-07 DIAGNOSIS — I255 Ischemic cardiomyopathy: Secondary | ICD-10-CM

## 2018-09-07 DIAGNOSIS — E1159 Type 2 diabetes mellitus with other circulatory complications: Secondary | ICD-10-CM

## 2018-09-07 DIAGNOSIS — I10 Essential (primary) hypertension: Secondary | ICD-10-CM | POA: Diagnosis not present

## 2018-09-07 DIAGNOSIS — R5383 Other fatigue: Secondary | ICD-10-CM

## 2018-09-07 DIAGNOSIS — I2510 Atherosclerotic heart disease of native coronary artery without angina pectoris: Secondary | ICD-10-CM

## 2018-09-07 DIAGNOSIS — I77810 Thoracic aortic ectasia: Secondary | ICD-10-CM

## 2018-09-07 DIAGNOSIS — E782 Mixed hyperlipidemia: Secondary | ICD-10-CM

## 2018-09-07 DIAGNOSIS — I2583 Coronary atherosclerosis due to lipid rich plaque: Secondary | ICD-10-CM

## 2018-09-07 NOTE — Patient Instructions (Signed)
Medication Instructions:  Your physician recommends that you continue on your current medications as directed. Please refer to the Current Medication list given to you today.  If you need a refill on your cardiac medications before your next appointment, please call your pharmacy.   Lab work: None  If you have labs (blood work) drawn today and your tests are completely normal, you will receive your results only by: Marland Kitchen MyChart Message (if you have MyChart) OR . A paper copy in the mail If you have any lab test that is abnormal or we need to change your treatment, we will call you to review the results.  Testing/Procedures: Your physician has requested that you have an echocardiogram. Echocardiography is a painless test that uses sound waves to create images of your heart. It provides your doctor with information about the size and shape of your heart and how well your heart's chambers and valves are working. This procedure takes approximately one hour. There are no restrictions for this procedure.    Follow-Up: At [facility], you and your health needs are our priority.  As part of our continuing mission to provide you with exceptional heart care, we have created designated Provider Care Teams.  These Care Teams include your primary Cardiologist (physician) and Advanced Practice Providers (APPs -  Physician Assistants and Nurse Practitioners) who all work together to provide you with the care you need, when you need it. You will need a follow up appointment in 1 year.  Please call our office 2 months in advance to schedule this appointment.  You may see Lance Muss, MD or one of the following Advanced Practice Providers on your designated Care Team:   [place], PA-C Ronie Spies, PA-C . Jacolyn Reedy, PA-C  Any Other Special Instructions Will Be Listed Below (If Applicable).

## 2018-09-27 ENCOUNTER — Ambulatory Visit (HOSPITAL_COMMUNITY): Payer: Medicare Other

## 2018-11-03 ENCOUNTER — Ambulatory Visit (HOSPITAL_COMMUNITY)
Admission: RE | Admit: 2018-11-03 | Discharge: 2018-11-03 | Disposition: A | Discharge status: Home / Self Care | Payer: Medicare Other | Source: Ambulatory Visit | Attending: Physician Assistant | Admitting: Physician Assistant

## 2018-11-03 ENCOUNTER — Telehealth: Payer: Self-pay | Admitting: Nurse Practitioner

## 2018-11-03 DIAGNOSIS — I255 Ischemic cardiomyopathy: Secondary | ICD-10-CM | POA: Diagnosis present

## 2018-11-03 DIAGNOSIS — R931 Abnormal findings on diagnostic imaging of heart and coronary circulation: Secondary | ICD-10-CM

## 2018-11-03 DIAGNOSIS — R5383 Other fatigue: Secondary | ICD-10-CM | POA: Diagnosis present

## 2018-11-03 DIAGNOSIS — I10 Essential (primary) hypertension: Secondary | ICD-10-CM

## 2018-11-03 NOTE — Progress Notes (Signed)
*  PRELIMINARY RESULTS* Echocardiogram 2D Echocardiogram has been performed.  Stacey Drain 11/03/2018, 9:18 AM

## 2018-11-03 NOTE — Telephone Encounter (Signed)
Reviewed echo results with patient. He agrees to go to [place] office for bmet and that someone from our office will call him after that test to advise on medication changes. I answered questions to his satisfaction and advised him that follow-up with Dr. Eldridge Dace will also be scheduled in the near future. Patient verbalized understanding and agreement with plan and thanked me for the call.

## 2018-11-03 NOTE — Telephone Encounter (Signed)
-----   Message from Dyann Kief, PA-C sent at 11/03/2018  2:24 PM EST ----- Patient has had some decrease in heart function from last echo.  EF 50% with diffuse hypokinesis.  Aortic root has also mildly increased in size to 43 mm was 42.  He could benefit from increasing his Vasotec to 5 mg once daily if renal function would allow.  Last creatinine was 1.37.  Need to check a bmet before any increase. Can also add Coreg. Please schedule f/u with Dr. Eldridge Dace to reassess.

## 2018-11-05 ENCOUNTER — Other Ambulatory Visit (HOSPITAL_COMMUNITY)
Admission: RE | Admit: 2018-11-05 | Discharge: 2018-11-05 | Disposition: A | Discharge status: Home / Self Care | Payer: Medicare Other | Source: Ambulatory Visit | Attending: Interventional Cardiology | Admitting: Interventional Cardiology

## 2018-11-05 ENCOUNTER — Telehealth: Payer: Self-pay | Admitting: *Deleted

## 2018-11-05 DIAGNOSIS — I10 Essential (primary) hypertension: Secondary | ICD-10-CM | POA: Diagnosis present

## 2018-11-05 DIAGNOSIS — R939 Diagnostic imaging inconclusive due to excess body fat of patient: Secondary | ICD-10-CM | POA: Insufficient documentation

## 2018-11-05 DIAGNOSIS — R931 Abnormal findings on diagnostic imaging of heart and coronary circulation: Secondary | ICD-10-CM

## 2018-11-05 LAB — BASIC METABOLIC PANEL
ANION GAP: 9 (ref 5–15)
BUN: 22 mg/dL (ref 8–23)
CHLORIDE: 103 mmol/L (ref 98–111)
CO2: 24 mmol/L (ref 22–32)
Calcium: 8.8 mg/dL — ABNORMAL LOW (ref 8.9–10.3)
Creatinine, Ser: 1.59 mg/dL — ABNORMAL HIGH (ref 0.61–1.24)
GFR calc non Af Amer: 41 mL/min — ABNORMAL LOW (ref >60)
GFR, EST AFRICAN AMERICAN: 48 mL/min — AB (ref >60)
Glucose, Bld: 150 mg/dL — ABNORMAL HIGH (ref 70–99)
POTASSIUM: 4.6 mmol/L (ref 3.5–5.1)
Sodium: 136 mmol/L (ref 135–145)

## 2018-11-05 MED ORDER — ENALAPRIL MALEATE 5 MG PO TABS: 5.0000 mg | ORAL_TABLET | Freq: Every day | ORAL | 3 refills | Status: DC

## 2018-11-05 NOTE — Telephone Encounter (Signed)
-----   Message from Allayne Butcher, [place] sent at 11/05/2018  2:41 PM EST ----- I'm covering [employer].  BMP was ordered to reassess renal function prior to increasing his Vasotec dose to 5 mg. His Cr is stable, compared to usual baseline (1.4-1.6). We can try increasing Vasotec to 5 mg daily but would check another BMP in 7 days and f/u w/ RN for BP check at time of repeat labs.

## 2018-11-05 NOTE — Telephone Encounter (Signed)
Informed patient of plan to increase enalapril to 5 mg daily, based on recent lab results.  He will finish up current bottle by taking 2 tablets at a time.  I will send new prescription to his pharmacy. He lives in [place] and wants to go to [facility] to have blood work and asked if could have BP checked there.  His wife has a BP cuff and uses it regularly.    Adv pt to go ahead and check BPs with home cuff and call us back next Friday with readings so that he doesn't have to drive to [place] to be seen.   Also provided s/s of low BP and adv to be aware of these just in case as well.  Released lab.  Placed copy of req in mail to patient.

## 2018-11-11 ENCOUNTER — Telehealth: Payer: Self-pay | Admitting: Cardiology

## 2018-11-11 NOTE — Telephone Encounter (Signed)
New Message:     Please call, wifet wants to talk to you about pt's lab work and medicine that [place] ordered for him. Wife says pt is confused.Marland Kitchen

## 2018-11-12 NOTE — Telephone Encounter (Signed)
Returned call to patient and his wife. Patient wanted to verify what medicine he was suppose to increase. Made patient aware that he was suppose to increase his enalapril to 5 mg QD. Patient states that he did that this Monday. Patient will have his labs checked in [place] next Monday. Patient will start to monitor his BP and keep a log and report readings next week. Patient verbalized understanding to all instruction and denied having any other concerns at this time.

## 2018-11-18 ENCOUNTER — Other Ambulatory Visit (HOSPITAL_COMMUNITY)
Admission: RE | Admit: 2018-11-18 | Discharge: 2018-11-18 | Disposition: A | Discharge status: Home / Self Care | Payer: Medicare Other | Source: Ambulatory Visit | Attending: Cardiology | Admitting: Cardiology

## 2018-11-18 DIAGNOSIS — R931 Abnormal findings on diagnostic imaging of heart and coronary circulation: Secondary | ICD-10-CM | POA: Diagnosis present

## 2018-11-18 LAB — BASIC METABOLIC PANEL
Anion gap: 9 (ref 5–15)
BUN: 21 mg/dL (ref 8–23)
CO2: 24 mmol/L (ref 22–32)
Calcium: 8.9 mg/dL (ref 8.9–10.3)
Chloride: 102 mmol/L (ref 98–111)
Creatinine, Ser: 1.54 mg/dL — ABNORMAL HIGH (ref 0.61–1.24)
GFR calc non Af Amer: 43 mL/min — ABNORMAL LOW (ref >60)
GFR, EST AFRICAN AMERICAN: 50 mL/min — AB (ref >60)
Glucose, Bld: 229 mg/dL — ABNORMAL HIGH (ref 70–99)
Potassium: 4.8 mmol/L (ref 3.5–5.1)
Sodium: 135 mmol/L (ref 135–145)

## 2018-11-19 ENCOUNTER — Telehealth: Payer: Self-pay

## 2018-11-19 NOTE — Telephone Encounter (Signed)
Notes recorded by Sigurd Sos, RN on 11/19/2018 at 8:46 AM EST The patient has been notified of the result and verbalized understanding. All questions (if any) were answered. Sigurd Sos, RN 11/19/2018 8:46 AM

## 2018-11-19 NOTE — Telephone Encounter (Signed)
-----   Message from Allayne Butcher, [place] sent at 11/19/2018  8:25 AM EST ----- Renal function is stable on higher dose of enalapril. Potassium level also ok. Continue current dose of enalapril. His BG levels however were high. Make sure he is taking diabetes meds and checking glucose levels at home and following up with PCP as directed for diabetes management.

## 2019-02-28 ENCOUNTER — Ambulatory Visit (HOSPITAL_COMMUNITY)
Admission: RE | Admit: 2019-02-28 | Discharge: 2019-02-28 | Disposition: A | Discharge status: Home / Self Care | Payer: Medicare Other | Source: Ambulatory Visit | Attending: Family Medicine | Admitting: Family Medicine

## 2019-02-28 ENCOUNTER — Other Ambulatory Visit (HOSPITAL_COMMUNITY): Payer: Self-pay | Admitting: Family Medicine

## 2019-02-28 ENCOUNTER — Other Ambulatory Visit: Payer: Self-pay

## 2019-02-28 DIAGNOSIS — G8911 Acute pain due to trauma: Secondary | ICD-10-CM | POA: Insufficient documentation

## 2019-10-03 NOTE — Progress Notes (Deleted)
Cardiology Office Note   Date:  10/03/2019   ID:  Shane Welch, DOB 07-07-1941, MRN [id number]  PCP:  Leonard Downing, MD    No chief complaint on file.  CAD/MI  Wt Readings from Last 3 Encounters:  09/07/18 232 lb (105.2 kg)  09/03/17 224 lb 3.2 oz (101.7 kg)  09/02/16 220 lb 9.6 oz (100.1 kg)       History of Present Illness: Shane Welch is a 78 y.o. 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  Ischemic cardiomyopathy ejection fraction 40 to 45% with anterior hypokinesis.  Repeat echo 03/2016 normal LV function.  Also has essential hypertension, HLD and DM type II.   Last saw Dr. Irish Lack 08/2017 and Brilinta changed to Plavix.  Aspirin stopped.  In 11/19, he said he never got his stamina back since his MI. Could only mow 1/2 his lawn before he gets tired and has to rest.  This is about the same since his MI.  Had not gotten any worse.  No chest pain, dyspnea, dyspnea on exertion, dizziness or presyncope at tat time.  Had extensive blood work by Dr. Arelia Sneddon in Sept. Was Not taking Plavix-never started.  No regular exercise.  Walks 1 to 2 miles daily just based on his steps.  Since the last visit, ***    Past Medical History:  Diagnosis Date  . Abnormal echocardiogram 03/11/2016  . Acute MI anterior wall first episode care [facility])   . CAD (coronary artery disease) 12/10/2015  . CAD (coronary artery disease), native coronary artery   . Diabetes ([place]) 12/10/2015  . Essential hypertension 09/03/2017  . Hyperlipidemia   . 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 Type II diabetes mellitus ([facility])    a. Dx ~ 2010.    Past Surgical History:  Procedure Laterality Date  . CARDIAC CATHETERIZATION N/A 12/08/2015   Procedure: Left Heart Cath and Coronary Angiography;  Surgeon: Jettie Booze, MD;  Location: [place] CV LAB;  Service: Cardiovascular;  Laterality: N/A;  . CARDIAC CATHETERIZATION  12/08/2015   Procedure:  Coronary Stent Intervention;  Surgeon: Jettie Booze, MD;  Location: [place] CV LAB;  Service: Cardiovascular;;  . CERVICAL SPINE SURGERY     a. ~ 2002.  . Right Knee Surgery     a. 08/2015.     Current Outpatient Medications  Medication Sig Dispense Refill  . acetaminophen (TYLENOL) 325 MG tablet Take 650 mg by mouth every 6 (six) hours as needed for mild pain, moderate pain or headache.    Marland Kitchen aspirin EC 81 MG tablet Take 81 mg by mouth daily.    Marland Kitchen atorvastatin (LIPITOR) 40 MG tablet Take 1 tablet (40 mg total) by mouth daily at 6 PM. 90 tablet 3  . B Complex Vitamins (B COMPLEX-B12 PO) Take 15-17 drops by mouth daily.    . clopidogrel (PLAVIX) 75 MG tablet Take 1 tablet (75 mg total) daily by mouth. 90 tablet 3  . enalapril (VASOTEC) 5 MG tablet Take 1 tablet (5 mg total) by mouth daily. 90 tablet 3  . glimepiride (AMARYL) 2 MG tablet Take 2 mg by mouth daily at 12 noon.    . metFORMIN (GLUCOPHAGE) 1000 MG tablet Take 1,000 mg by mouth 2 (two) times daily.    . nitroGLYCERIN (NITROSTAT) 0.4 MG SL tablet Place 0.4 mg under the tongue every 5 (five) minutes as needed for chest pain (MAX 3 TABLETS  IN 15 MINS).     No current facility-administered medications for this visit.    Allergies:   Sulfa antibiotics    Social History:  The patient  reports that he has quit smoking. His smoking use included cigarettes. He has a 30.00 pack-year smoking history. He has never used smokeless tobacco. He reports current alcohol use. He reports that he does not use drugs.   Family History:  The patient's ***family history includes Cancer in his father; Dementia in his mother.    ROS:  Please see the history of present illness.   Otherwise, review of systems are positive for ***.   All other systems are reviewed and negative.    PHYSICAL EXAM: VS:  There were no vitals taken for this visit. , BMI There is no height or weight on file to calculate BMI. GEN: Well nourished, well developed, in  no acute distress HEENT: normal Neck: no JVD, carotid bruits, or masses Cardiac: ***RRR; no murmurs, rubs, or gallops,no edema  Respiratory:  clear to auscultation bilaterally, normal work of breathing GI: soft, nontender, nondistended, + BS MS: no deformity or atrophy Skin: warm and dry, no rash Neuro:  Strength and sensation are intact Psych: euthymic mood, full affect   EKG:   The ekg ordered today demonstrates ***   Recent Labs: 11/18/2018: BUN 21; Creatinine, Ser 1.54; Potassium 4.8; Sodium 135   Lipid Panel    Component Value Date/Time   CHOL 107 12/01/2016 0806   TRIG 117 12/01/2016 0806   HDL 36 (L) 12/01/2016 0806   CHOLHDL 3.0 12/01/2016 0806   CHOLHDL 2.6 03/26/2016 0959   VLDL 16 03/26/2016 0959   LDLCALC 48 12/01/2016 0806     Other studies Reviewed: Additional studies/ records that were reviewed today with results demonstrating: ***.   ASSESSMENT AND PLAN:  1. CAD:  2. HTN: 3. Hyperlipidemia: 4. Ascending aortic dilatation: 5. Type 2 DM:   Current medicines are reviewed at length with the patient today.  The patient concerns regarding his medicines were addressed.  The following changes have been made:  No change***  Labs/ tests ordered today include: *** No orders of the defined types were placed in this encounter.   Recommend 150 minutes/week of aerobic exercise Low fat, low carb, high fiber diet recommended  Disposition:   FU in ***   Signed, Lance Muss, MD  10/03/2019 5:08 PM    [facility] [address] [place], [place], [place]  [address] Phone: [phone]; Fax: [phone]

## 2019-10-04 ENCOUNTER — Ambulatory Visit: Payer: Medicare Other | Admitting: Interventional Cardiology

## 2019-10-04 DIAGNOSIS — E782 Mixed hyperlipidemia: Secondary | ICD-10-CM

## 2019-10-04 DIAGNOSIS — I10 Essential (primary) hypertension: Secondary | ICD-10-CM

## 2019-10-04 DIAGNOSIS — E1159 Type 2 diabetes mellitus with other circulatory complications: Secondary | ICD-10-CM

## 2019-10-04 DIAGNOSIS — I2583 Coronary atherosclerosis due to lipid rich plaque: Secondary | ICD-10-CM

## 2019-10-04 DIAGNOSIS — I77810 Thoracic aortic ectasia: Secondary | ICD-10-CM

## 2019-11-20 NOTE — Progress Notes (Signed)
Cardiology Office Note   Date:  11/22/2019   ID:  Shane Welch, DOB September 26, 1941, MRN [id number]  PCP:  Kaleen Mask, MD    No chief complaint on file.  CAD  Wt Readings from Last 3 Encounters:  11/22/19 220 lb 6.4 oz (100 kg)  09/07/18 232 lb (105.2 kg)  09/03/17 224 lb 3.2 oz (101.7 kg)       History of Present Illness: Shane Welch is a 79 y.o. 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 He underwent cardiac cath and a drug-eluting stent was placed in the mid LAD. His ejection fraction was 40-45% with anterior hypokinesis of the time.  Repeat echo in 6/17 showed normal LV function.  He had a trip to [place] a few years ago.  In 2019, he reported: "he never got his stamina back since his MI. Can only mow 1/2 his lawn before he gets tired and has to rest.  This is about the same since his MI.  Has not gotten any worse."  It was also noted with [name] in 2019: "Patient never started Plavix last year and is only taken an aspirin once a day.  Discussed with Dr. Eldridge Dace who does recommend Plavix over aspirin.  Patient does not want to change his medications and states he is not a medication taker.  He will remain on aspirin alone."  Since the last visit, he has done well.  He has decided to not take the vaccine.  He continues to report lack of stamina.   Denies : Chest pain. Dizziness. Leg edema. Nitroglycerin use. Orthopnea. Palpitations. Paroxysmal nocturnal dyspnea. Shortness of breath. Syncope.   Not walking much.  Just does what he needs to get done.   Wife does exercise regularly but he does not join her.  Past Medical History:  Diagnosis Date  . Abnormal echocardiogram 03/11/2016  . Acute MI anterior wall first episode care [facility])   . CAD (coronary artery disease) 12/10/2015  . CAD (coronary artery disease), native coronary artery   . Diabetes (HCC) 12/10/2015  . Essential hypertension 09/03/2017  . Hyperlipidemia   . 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 Type II diabetes mellitus (HCC)    a. Dx ~ 2010.    Past Surgical History:  Procedure Laterality Date  . CARDIAC CATHETERIZATION N/A 12/08/2015   Procedure: Left Heart Cath and Coronary Angiography;  Surgeon: Corky Crafts, MD;  Location: [facility] INVASIVE CV LAB;  Service: Cardiovascular;  Laterality: N/A;  . CARDIAC CATHETERIZATION  12/08/2015   Procedure: Coronary Stent Intervention;  Surgeon: Corky Crafts, MD;  Location: [facility] INVASIVE CV LAB;  Service: Cardiovascular;;  . CERVICAL SPINE SURGERY     a. ~ 2002.  . Right Knee Surgery     a. 08/2015.     Current Outpatient Medications  Medication Sig Dispense Refill  . acetaminophen (TYLENOL) 325 MG tablet Take 650 mg by mouth every 6 (six) hours as needed for mild pain, moderate pain or headache.    Marland Kitchen aspirin EC 81 MG tablet Take 81 mg by mouth daily.    Marland Kitchen atorvastatin (LIPITOR) 40 MG tablet Take 1 tablet (40 mg total) by mouth daily at 6 PM. 90 tablet 3  . enalapril (VASOTEC) 5 MG tablet Take 2.5 mg by mouth daily.    Marland Kitchen glipiZIDE (GLUCOTROL XL) 5 MG 24 hr tablet Take 5 mg by mouth daily with breakfast.    . metFORMIN (GLUCOPHAGE) 1000 MG tablet Take 1,000  mg by mouth 2 (two) times daily.    . nitroGLYCERIN (NITROSTAT) 0.4 MG SL tablet Place 0.4 mg under the tongue every 5 (five) minutes as needed for chest pain (MAX 3 TABLETS IN 15 MINS).     No current facility-administered medications for this visit.    Allergies:   Sulfa antibiotics    Social History:  The patient  reports that he has quit smoking. His smoking use included cigarettes. He has a 30.00 pack-year smoking history. He has never used smokeless tobacco. He reports current alcohol use. He reports that he does not use drugs.   Family History:  The patient's family history includes Cancer in his father; Dementia in his mother.    ROS:  Please see the history of present illness.   Otherwise, review of systems are positive for leg pains- improved;  weight loss due to decreased portion sizes.   All other systems are reviewed and negative.    PHYSICAL EXAM: VS:  BP 128/70   Pulse 81   Ht 5\' 11"  (1.803 m)   Wt 220 lb 6.4 oz (100 kg)   SpO2 98%   BMI 30.74 kg/m  , BMI Body mass index is 30.74 kg/m. GEN: Well nourished, well developed, in no acute distress  HEENT: normal  Neck: no JVD, carotid bruits, or masses Cardiac: RRR; no murmurs, rubs, or gallops,no edema  Respiratory:  clear to auscultation bilaterally, normal work of breathing GI: soft, nontender, nondistended, + BS MS: no deformity or atrophy  Skin: warm and dry, no rash Neuro:  Strength and sensation are intact Psych: euthymic mood, full affect   EKG:   The ekg ordered today demonstrates NSR, PVC, no ST changes   Recent Labs: No results found for requested labs within last 8760 hours.   Lipid Panel    Component Value Date/Time   CHOL 107 12/01/2016 0806   TRIG 117 12/01/2016 0806   HDL 36 (L) 12/01/2016 0806   CHOLHDL 3.0 12/01/2016 0806   CHOLHDL 2.6 03/26/2016 0959   VLDL 16 03/26/2016 0959   LDLCALC 48 12/01/2016 0806     Other studies Reviewed: Additional studies/ records that were reviewed today with results demonstrating: labs pending.   ASSESSMENT AND PLAN:  1. CAD: COntinue aggressive secondary prevention.  COntinue aspirin; no Plavix per his preference.  Whole food, plant based diet recommended. 2. Hyperlipidemia: Checked with PMD.   COntinue atorvastatin.  3. HTN: The current medical regimen is effective;  continue present plan and medications.   4. Old MI: EF has been mildly decreased.  Fatigue has persisted.  5. DM: He states it was < 7.  Continue DM treatment.  6. Decreased ejection fraction- chronic systolic heart failure: Improved to 50%   Current medicines are reviewed at length with the patient today.  The patient concerns regarding his medicines were addressed.  The following changes have been made:  No change  Labs/ tests  ordered today include:  No orders of the defined types were placed in this encounter.   Recommend 150 minutes/week of aerobic exercise Low fat, low carb, high fiber diet recommended  Disposition:   FU in 1 year   Signed, Larae Grooms, MD  11/22/2019 8:48 AM    [facility] [address], [place], [place]  [address] Phone: [phone]; Fax: [phone]

## 2019-11-22 ENCOUNTER — Other Ambulatory Visit: Payer: Self-pay

## 2019-11-22 ENCOUNTER — Ambulatory Visit: Payer: Medicare PPO | Admitting: Interventional Cardiology

## 2019-11-22 ENCOUNTER — Encounter: Payer: Self-pay | Admitting: Interventional Cardiology

## 2019-11-22 VITALS — BP 128/70 | HR 81 | Ht 71.0 in | Wt 220.4 lb

## 2019-11-22 DIAGNOSIS — I25118 Atherosclerotic heart disease of native coronary artery with other forms of angina pectoris: Secondary | ICD-10-CM

## 2019-11-22 DIAGNOSIS — E782 Mixed hyperlipidemia: Secondary | ICD-10-CM | POA: Diagnosis not present

## 2019-11-22 DIAGNOSIS — I10 Essential (primary) hypertension: Secondary | ICD-10-CM

## 2019-11-22 DIAGNOSIS — E1159 Type 2 diabetes mellitus with other circulatory complications: Secondary | ICD-10-CM

## 2019-11-22 DIAGNOSIS — I77810 Thoracic aortic ectasia: Secondary | ICD-10-CM

## 2019-11-22 NOTE — Patient Instructions (Signed)

## 2020-11-19 ENCOUNTER — Ambulatory Visit: Payer: Medicare PPO | Admitting: Interventional Cardiology

## 2020-12-21 NOTE — Progress Notes (Signed)
Cardiology Office Note   Date:  12/24/2020   ID:  Shane Welch, DOB 08/16/1941, MRN [id number]  PCP:  Shane Mask, MD    No chief complaint on file.  CAD  Wt Readings from Last 3 Encounters:  12/24/20 222 lb 3.2 oz (100.8 kg)  11/22/19 220 lb 6.4 oz (100 kg)  09/07/18 232 lb (105.2 kg)       History of Present Illness: Shane Welch is a 80 y.o. male   Who had an anterior MI in February 2017. He underwent cardiac cath and a drug-eluting stent was placed in the mid LAD. His ejection fraction was 40-45% with anterior hypokinesis of the time.  Repeat echo in 6/17 showed normal LV function.  He had a trip to [place] a few years ago.  In 2019, he reported: "he never got his stamina back since his MI. Can only mow 1/2 his lawn before he gets tired and has to rest.This is about the same since his MI. Has not gotten any worse."  It was also noted with Shane Welch in 2019: "Patient never started Plavix last year and is only taken an aspirin once a day. Discussed with Dr. Eldridge Welch who does recommend Plavix over aspirin. Patient does not want to change his medications and states he is not a medication taker. He will remain on aspirin alone."  Denies : Chest pain. Leg edema. Nitroglycerin use. Orthopnea. Palpitations. Paroxysmal nocturnal dyspnea. Shortness of breath. Syncope.   Rare dizziness, lasting a few seconds when he stands.    Short term memory issues.  Feels this is getting worse.  He wants to avoid statins and as many meds as possible.    Past Medical History:  Diagnosis Date   Abnormal echocardiogram 03/11/2016   Acute MI anterior wall first episode care [facility])    CAD (coronary artery disease) 12/10/2015   CAD (coronary artery disease), native coronary artery    Diabetes (HCC) 12/10/2015   Essential hypertension 09/03/2017   Hyperlipidemia    Old MI (myocardial infarction) 03/11/2016   Type II diabetes mellitus (HCC)    a. Dx ~ 2010.    Past  Surgical History:  Procedure Laterality Date   CARDIAC CATHETERIZATION N/A 12/08/2015   Procedure: Left Heart Cath and Coronary Angiography;  Surgeon: Corky Crafts, MD;  Location: [facility] INVASIVE CV LAB;  Service: Cardiovascular;  Laterality: N/A;   CARDIAC CATHETERIZATION  12/08/2015   Procedure: Coronary Stent Intervention;  Surgeon: Corky Crafts, MD;  Location: [facility] INVASIVE CV LAB;  Service: Cardiovascular;;   CERVICAL SPINE SURGERY     a. ~ 2002.   Right Knee Surgery     a. 08/2015.     Current Outpatient Medications  Medication Sig Dispense Refill   acetaminophen (TYLENOL) 325 MG tablet Take 650 mg by mouth every 6 (six) hours as needed for mild pain, moderate pain or headache.     aspirin EC 81 MG tablet Take 81 mg by mouth daily.     enalapril (VASOTEC) 5 MG tablet Take 2.5 mg by mouth daily.     glipiZIDE (GLUCOTROL XL) 5 MG 24 hr tablet Take 5 mg by mouth daily with breakfast.     metFORMIN (GLUCOPHAGE) 1000 MG tablet Take 500 mg by mouth 2 (two) times daily.     nitroGLYCERIN (NITROSTAT) 0.4 MG SL tablet Place 0.4 mg under the tongue every 5 (five) minutes as needed for chest pain (MAX 3 TABLETS IN 15 MINS).     No  current facility-administered medications for this visit.    Allergies:   Sulfa antibiotics    Social History:  The patient  reports that he has quit smoking. His smoking use included cigarettes. He has a 30.00 pack-year smoking history. He has never used smokeless tobacco. He reports current alcohol use. He reports that he does not use drugs.   Family History:  The patient's family history includes Cancer in his father; Dementia in his mother.    ROS:  Please see the history of present illness.   Otherwise, review of systems are positive for lack of stamina.   All other systems are reviewed and negative.    PHYSICAL EXAM: VS:  BP 122/78    Pulse 78    Ht 5\' 11"  (1.803 m)    Wt 222 lb 3.2 oz (100.8 kg)    SpO2 98%    BMI 30.99 kg/m  , BMI  Body mass index is 30.99 kg/m. GEN: Well nourished, well developed, in no acute distress  HEENT: normal  Neck: no JVD, carotid bruits, or masses Cardiac: RRR; no murmurs, rubs, or gallops,no edema  Respiratory:  clear to auscultation bilaterally, normal work of breathing GI: soft, nontender, nondistended, + BS MS: no deformity or atrophy  Skin: warm and dry, no rash Neuro:  Strength and sensation are intact Psych: euthymic mood, full affect   EKG:   The ekg ordered today demonstrates NSR, PVC, PRWP   Recent Labs: No results found for requested labs within last 8760 hours.   Lipid Panel    Component Value Date/Time   CHOL 107 12/01/2016 0806   TRIG 117 12/01/2016 0806   HDL 36 (L) 12/01/2016 0806   CHOLHDL 3.0 12/01/2016 0806   CHOLHDL 2.6 03/26/2016 0959   VLDL 16 03/26/2016 0959   LDLCALC 48 12/01/2016 0806     Other studies Reviewed: Additional studies/ records that were reviewed today with results demonstrating: 2020 labs reviewed.   ASSESSMENT AND PLAN:  1. CAD/Old MI: No angina.  Continue aggressive secondary prevention.   2. Hyperlipidemia: Check lipids today.  He stopped the atorvastatin due to memory issues several years ago.  He does not want to be on a statin if possible.  Will see if he has any other options with the PharmD.  Whole food, plant based diet.  3. HTN: The current medical regimen is effective;  continue present plan and medications. 4. DM: A1C 6.3 in 09/2019.  5. Fatigue: Check TSH.  6. Prior tobacco abuse: quit many years ago. 7. FH AAA: Prior smoker.  Will plan for abdominal u/s to look for AAA given his multiple RF.   Current medicines are reviewed at length with the patient today.  The patient concerns regarding his medicines were addressed.  The following changes have been made:  No change  Labs/ tests ordered today include: CMet, CBC, TSH, A1C; will forward results to Dr. 10/2019 No orders of the defined types were placed in this  encounter.   Recommend 150 minutes/week of aerobic exercise Low fat, low carb, high fiber diet recommended  Disposition:   FU in 1 year   Signed, Jeannetta Nap, MD  12/24/2020 9:35 AM    [facility] [address] [place], [place], [place]  [place] Phone: [phone]; Fax: [phone]

## 2020-12-24 ENCOUNTER — Other Ambulatory Visit: Payer: Self-pay

## 2020-12-24 ENCOUNTER — Ambulatory Visit: Payer: Medicare PPO | Admitting: Interventional Cardiology

## 2020-12-24 ENCOUNTER — Encounter: Payer: Self-pay | Admitting: Interventional Cardiology

## 2020-12-24 VITALS — BP 122/78 | HR 78 | Ht 71.0 in | Wt 222.2 lb

## 2020-12-24 DIAGNOSIS — I77810 Thoracic aortic ectasia: Secondary | ICD-10-CM

## 2020-12-24 DIAGNOSIS — Z8249 Family history of ischemic heart disease and other diseases of the circulatory system: Secondary | ICD-10-CM

## 2020-12-24 DIAGNOSIS — I10 Essential (primary) hypertension: Secondary | ICD-10-CM

## 2020-12-24 DIAGNOSIS — I25118 Atherosclerotic heart disease of native coronary artery with other forms of angina pectoris: Secondary | ICD-10-CM

## 2020-12-24 DIAGNOSIS — E782 Mixed hyperlipidemia: Secondary | ICD-10-CM

## 2020-12-24 DIAGNOSIS — E1159 Type 2 diabetes mellitus with other circulatory complications: Secondary | ICD-10-CM | POA: Diagnosis not present

## 2020-12-24 DIAGNOSIS — R5383 Other fatigue: Secondary | ICD-10-CM

## 2020-12-24 LAB — COMPREHENSIVE METABOLIC PANEL
ALT: 11 IU/L (ref 0–44)
AST: 16 IU/L (ref 0–40)
Albumin/Globulin Ratio: 1.7 (ref 1.2–2.2)
Albumin: 4.5 g/dL (ref 3.7–4.7)
Alkaline Phosphatase: 58 IU/L (ref 44–121)
BUN/Creatinine Ratio: 16 (ref 10–24)
BUN: 25 mg/dL (ref 8–27)
Bilirubin Total: 0.5 mg/dL (ref 0.0–1.2)
CO2: 21 mmol/L (ref 20–29)
Calcium: 9.3 mg/dL (ref 8.6–10.2)
Chloride: 101 mmol/L (ref 96–106)
Creatinine, Ser: 1.58 mg/dL — ABNORMAL HIGH (ref 0.76–1.27)
Globulin, Total: 2.6 g/dL (ref 1.5–4.5)
Glucose: 157 mg/dL — ABNORMAL HIGH (ref 65–99)
Potassium: 4.8 mmol/L (ref 3.5–5.2)
Sodium: 140 mmol/L (ref 134–144)
Total Protein: 7.1 g/dL (ref 6.0–8.5)
eGFR: 44 mL/min/{1.73_m2} — ABNORMAL LOW (ref >59)

## 2020-12-24 LAB — CBC
Hematocrit: 45.6 % (ref 37.5–51.0)
Hemoglobin: 15.5 g/dL (ref 13.0–17.7)
MCH: 29.5 pg (ref 26.6–33.0)
MCHC: 34 g/dL (ref 31.5–35.7)
MCV: 87 fL (ref 79–97)
Platelets: 223 10*3/uL (ref 150–450)
RBC: 5.26 x10E6/uL (ref 4.14–5.80)
RDW: 12.6 % (ref 11.6–15.4)
WBC: 8.9 10*3/uL (ref 3.4–10.8)

## 2020-12-24 LAB — LIPID PANEL
Chol/HDL Ratio: 5.6 ratio — ABNORMAL HIGH (ref 0.0–5.0)
Cholesterol, Total: 230 mg/dL — ABNORMAL HIGH (ref 100–199)
HDL: 41 mg/dL (ref >39)
LDL Chol Calc (NIH): 156 mg/dL — ABNORMAL HIGH (ref 0–99)
Triglycerides: 180 mg/dL — ABNORMAL HIGH (ref 0–149)
VLDL Cholesterol Cal: 33 mg/dL (ref 5–40)

## 2020-12-24 LAB — TSH: TSH: 0.017 u[IU]/mL — ABNORMAL LOW (ref 0.450–4.500)

## 2020-12-24 LAB — HEMOGLOBIN A1C
Est. average glucose Bld gHb Est-mCnc: 148 mg/dL
Hgb A1c MFr Bld: 6.8 % — ABNORMAL HIGH (ref 4.8–5.6)

## 2020-12-24 NOTE — Patient Instructions (Signed)
Medication Instructions:  Your physician recommends that you continue on your current medications as directed. Please refer to the Current Medication list given to you today.  *If you need a refill on your cardiac medications before your next appointment, please call your pharmacy*   Lab Work: Lab work to be done today--CBC, CMET, TSH, lipid, A1C If you have labs (blood work) drawn today and your tests are completely normal, you will receive your results only by: Marland Kitchen MyChart Message (if you have MyChart) OR . A paper copy in the mail If you have any lab test that is abnormal or we need to change your treatment, we will call you to review the results.   Testing/Procedures: Your physician has requested that you have an abdominal aorta duplex. During this test, an ultrasound is used to evaluate the aorta. Allow 30 minutes for this exam. Do not eat after midnight the day before and avoid carbonated beverages    Follow-Up: At [facility], you and your health needs are our priority.  As part of our continuing mission to provide you with exceptional heart care, we have created designated Provider Care Teams.  These Care Teams include your primary Cardiologist (physician) and Advanced Practice Providers (APPs -  Physician Assistants and Nurse Practitioners) who all work together to provide you with the care you need, when you need it.  We recommend signing up for the patient portal called "MyChart".  Sign up information is provided on this After Visit Summary.  MyChart is used to connect with patients for Virtual Visits (Telemedicine).  Patients are able to view lab/test results, encounter notes, upcoming appointments, etc.  Non-urgent messages can be sent to your provider as well.   To learn more about what you can do with MyChart, go to [email or web address].    Your next appointment:   12 month(s)  The format for your next appointment:   In Person  Provider:   You may see Lance Muss, MD or one of the following Advanced Practice Providers on your designated Care Team:    Ronie Spies, PA-C  Jacolyn Reedy, PA-C    Other Instructions

## 2020-12-25 ENCOUNTER — Telehealth: Payer: Self-pay | Admitting: *Deleted

## 2020-12-25 DIAGNOSIS — E782 Mixed hyperlipidemia: Secondary | ICD-10-CM

## 2020-12-25 NOTE — Telephone Encounter (Signed)
I spoke with patient and his wife and reviewed lab results with them. Appointment in lipid clinic scheduled for March 24,2022 at 10:30.  Lab results routed to Dr Shelah Lewandowsky

## 2020-12-25 NOTE — Telephone Encounter (Signed)
-----   Message from Corky Crafts, MD sent at 12/25/2020  8:25 AM EST ----- LDL well above target. Need to consider lipid lowering therapy.  Other labs are stable.  Please cc: Dr. Jeannetta Nap for management of thyroid.  Refer to pharmD lipid clinic to explore non-statin ways of lowering lipids.  He wants to avoid statins due to memory concerns.

## 2021-01-02 ENCOUNTER — Ambulatory Visit (HOSPITAL_COMMUNITY)
Admission: RE | Admit: 2021-01-02 | Discharge: 2021-01-02 | Disposition: A | Discharge status: Home / Self Care | Payer: Medicare PPO | Source: Ambulatory Visit | Attending: Cardiovascular Disease | Admitting: Cardiovascular Disease

## 2021-01-02 ENCOUNTER — Other Ambulatory Visit: Payer: Self-pay

## 2021-01-02 DIAGNOSIS — I77810 Thoracic aortic ectasia: Secondary | ICD-10-CM | POA: Diagnosis present

## 2021-01-02 DIAGNOSIS — I25118 Atherosclerotic heart disease of native coronary artery with other forms of angina pectoris: Secondary | ICD-10-CM | POA: Diagnosis not present

## 2021-01-07 ENCOUNTER — Encounter (HOSPITAL_COMMUNITY): Payer: Self-pay | Admitting: Emergency Medicine

## 2021-01-07 ENCOUNTER — Other Ambulatory Visit: Payer: Self-pay

## 2021-01-07 ENCOUNTER — Emergency Department (HOSPITAL_COMMUNITY): Payer: Medicare PPO

## 2021-01-07 ENCOUNTER — Inpatient Hospital Stay (HOSPITAL_COMMUNITY)
Admission: EM | Admit: 2021-01-07 | Discharge: 2021-01-11 | DRG: 065 | Disposition: A | Discharge status: Home Health | Payer: Medicare PPO | Attending: Family Medicine | Admitting: Family Medicine

## 2021-01-07 DIAGNOSIS — I161 Hypertensive emergency: Secondary | ICD-10-CM | POA: Diagnosis present

## 2021-01-07 DIAGNOSIS — I2510 Atherosclerotic heart disease of native coronary artery without angina pectoris: Secondary | ICD-10-CM | POA: Diagnosis present

## 2021-01-07 DIAGNOSIS — F05 Delirium due to known physiological condition: Secondary | ICD-10-CM | POA: Diagnosis not present

## 2021-01-07 DIAGNOSIS — R270 Ataxia, unspecified: Secondary | ICD-10-CM | POA: Diagnosis present

## 2021-01-07 DIAGNOSIS — E785 Hyperlipidemia, unspecified: Secondary | ICD-10-CM | POA: Diagnosis present

## 2021-01-07 DIAGNOSIS — Z20822 Contact with and (suspected) exposure to covid-19: Secondary | ICD-10-CM | POA: Diagnosis present

## 2021-01-07 DIAGNOSIS — W19XXXA Unspecified fall, initial encounter: Secondary | ICD-10-CM

## 2021-01-07 DIAGNOSIS — Z781 Physical restraint status: Secondary | ICD-10-CM

## 2021-01-07 DIAGNOSIS — Z87891 Personal history of nicotine dependence: Secondary | ICD-10-CM

## 2021-01-07 DIAGNOSIS — I63531 Cerebral infarction due to unspecified occlusion or stenosis of right posterior cerebral artery: Principal | ICD-10-CM | POA: Diagnosis present

## 2021-01-07 DIAGNOSIS — Z79899 Other long term (current) drug therapy: Secondary | ICD-10-CM

## 2021-01-07 DIAGNOSIS — R29700 NIHSS score 0: Secondary | ICD-10-CM | POA: Diagnosis present

## 2021-01-07 DIAGNOSIS — Z7984 Long term (current) use of oral hypoglycemic drugs: Secondary | ICD-10-CM

## 2021-01-07 DIAGNOSIS — G459 Transient cerebral ischemic attack, unspecified: Secondary | ICD-10-CM | POA: Diagnosis not present

## 2021-01-07 DIAGNOSIS — E1142 Type 2 diabetes mellitus with diabetic polyneuropathy: Secondary | ICD-10-CM | POA: Diagnosis present

## 2021-01-07 DIAGNOSIS — W1830XA Fall on same level, unspecified, initial encounter: Secondary | ICD-10-CM | POA: Diagnosis not present

## 2021-01-07 DIAGNOSIS — I639 Cerebral infarction, unspecified: Secondary | ICD-10-CM

## 2021-01-07 DIAGNOSIS — I252 Old myocardial infarction: Secondary | ICD-10-CM

## 2021-01-07 DIAGNOSIS — E119 Type 2 diabetes mellitus without complications: Secondary | ICD-10-CM

## 2021-01-07 DIAGNOSIS — Z7982 Long term (current) use of aspirin: Secondary | ICD-10-CM

## 2021-01-07 DIAGNOSIS — S80212A Abrasion, left knee, initial encounter: Secondary | ICD-10-CM | POA: Diagnosis not present

## 2021-01-07 DIAGNOSIS — E1159 Type 2 diabetes mellitus with other circulatory complications: Secondary | ICD-10-CM

## 2021-01-07 DIAGNOSIS — G9349 Other encephalopathy: Secondary | ICD-10-CM | POA: Diagnosis present

## 2021-01-07 DIAGNOSIS — Y92230 Patient room in hospital as the place of occurrence of the external cause: Secondary | ICD-10-CM | POA: Diagnosis not present

## 2021-01-07 DIAGNOSIS — Z955 Presence of coronary angioplasty implant and graft: Secondary | ICD-10-CM

## 2021-01-07 DIAGNOSIS — R471 Dysarthria and anarthria: Secondary | ICD-10-CM | POA: Diagnosis present

## 2021-01-07 DIAGNOSIS — G901 Familial dysautonomia [Riley-Day]: Secondary | ICD-10-CM | POA: Diagnosis present

## 2021-01-07 DIAGNOSIS — R296 Repeated falls: Secondary | ICD-10-CM | POA: Diagnosis present

## 2021-01-07 DIAGNOSIS — I951 Orthostatic hypotension: Secondary | ICD-10-CM | POA: Diagnosis present

## 2021-01-07 DIAGNOSIS — Z66 Do not resuscitate: Secondary | ICD-10-CM | POA: Diagnosis present

## 2021-01-07 DIAGNOSIS — I10 Essential (primary) hypertension: Secondary | ICD-10-CM | POA: Diagnosis present

## 2021-01-07 HISTORY — DX: Cerebral infarction, unspecified: I63.9

## 2021-01-07 NOTE — ED Triage Notes (Signed)
Pt had fall at home and ems states he pt was orthostatic.

## 2021-01-07 NOTE — ED Provider Notes (Signed)
[facility] EMERGENCY DEPARTMENT Provider Note   CSN: [id number] Arrival date & time: 01/07/21  2054     History Chief Complaint  Patient presents with  . Fall    Shane Welch is a 80 y.o. male.  Patient here with fall and altered mental status.  Patient states he was sitting in a chair and got up to go to the kitchen to get some peanuts candy.  While there he states he felt weak in his legs and wobbly and somehow ended up on the ground.  His wife heard him fall but he does not recall what happened.  When she got to him patient was confused.  Patient's wife states before the patient got up to go to the kitchen he was in the chair and seemed like he was having difficulty getting his words out and had difficulty using his left arm because he could not coordinate it to use the recliner.  Patient recalls this.  She wanted to call EMS then but the patient said no.  When he got up he felt off balance and felt like his legs were generally weak he fell to the ground.  He denies any injury from the fall. Denies any head, neck, back, chest or abdominal pain.  No difficulty breathing, cough or fever.  He feels back to baseline now.  He denies any focal weakness, numbness or tingling.  No difficulty speaking or difficulty swallowing.  Before he admits he had trouble trying to get his words out.  Wife did not notice any facial droop.  The history is provided by the patient and a relative.  Fall Pertinent negatives include no chest pain, no abdominal pain and no shortness of breath.       Past Medical History:  Diagnosis Date  . Abnormal echocardiogram 03/11/2016  . Acute MI anterior wall first episode care [facility])   . CAD (coronary artery disease) 12/10/2015  . CAD (coronary artery disease), native coronary artery   . Diabetes (HCC) 12/10/2015  . Essential hypertension 09/03/2017  . Hyperlipidemia   . 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 Type II diabetes mellitus (HCC)    a. Dx ~ 2010.     Patient Active Problem List   Diagnosis Date Noted  . 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 Essential hypertension 09/03/2017  . 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 Abnormal echocardiogram 03/11/2016  . CAD (coronary artery disease) 12/10/2015  . Hyperlipidemia 12/10/2015  . Diabetes (HCC) 12/10/2015  . Acute MI anterior wall first episode care [facility])     Past Surgical History:  Procedure Laterality Date  . CARDIAC CATHETERIZATION N/A 12/08/2015   Procedure: Left Heart Cath and Coronary Angiography;  Surgeon: Corky Crafts, MD;  Location: [facility] INVASIVE CV LAB;  Service: Cardiovascular;  Laterality: N/A;  . CARDIAC CATHETERIZATION  12/08/2015   Procedure: Coronary Stent Intervention;  Surgeon: Corky Crafts, MD;  Location: [facility] INVASIVE CV LAB;  Service: Cardiovascular;;  . CERVICAL SPINE SURGERY     a. ~ 2002.  . Right Knee Surgery     a. 08/2015.       Family History  Problem Relation Age of Onset  . Cancer Father        died in his 24's.  . Dementia Mother        died in her 81's.    Social History   Tobacco Use  . Smoking status: Former Smoker    Packs/day: 1.00    Years: 30.00  Pack years: 30.00    Types: Cigarettes  . Smokeless tobacco: Never Used  . Tobacco comment: quit 1993  Vaping Use  . Vaping Use: Never used  Substance Use Topics  . Alcohol use: Yes    Alcohol/week: 0.0 standard drinks    Comment: rare drink  . Drug use: No    Home Medications Prior to Admission medications   Medication Sig Start Date End Date Taking? Authorizing Provider  acetaminophen (TYLENOL) 325 MG tablet Take 650 mg by mouth every 6 (six) hours as needed for mild pain, moderate pain or headache.    [provider]  aspirin EC 81 MG tablet Take 81 mg by mouth daily. 12/17/15   [provider]  enalapril (VASOTEC) 5 MG tablet Take 2.5 mg by mouth daily.    [provider]  glipiZIDE (GLUCOTROL XL) 5 MG 24 hr tablet Take 5 mg by  mouth daily with breakfast.    [provider]  metFORMIN (GLUCOPHAGE) 1000 MG tablet Take 500 mg by mouth 2 (two) times daily. 10/27/15   [provider]  nitroGLYCERIN (NITROSTAT) 0.4 MG SL tablet Place 0.4 mg under the tongue every 5 (five) minutes as needed for chest pain (MAX 3 TABLETS IN 15 MINS).    [provider]    Allergies    Sulfa antibiotics  Review of Systems   Review of Systems  Constitutional: Negative for activity change, appetite change, fatigue and fever.  HENT: Negative for congestion and rhinorrhea.   Respiratory: Negative for chest tightness and shortness of breath.   Cardiovascular: Negative for chest pain.  Gastrointestinal: Negative for abdominal pain, nausea and vomiting.  Genitourinary: Negative for dysuria and hematuria.  Musculoskeletal: Negative for arthralgias and myalgias.  Skin: Negative for rash.  Neurological: Positive for dizziness, speech difficulty, weakness and light-headedness.   all other systems are negative except as noted in the HPI and PMH.   Physical Exam Updated Vital Signs BP (!) 152/88   Pulse 83   Temp 98.2 F (36.8 C) (Oral)   Resp 15   Ht 5\' 11"  (1.803 m)   Wt 99.8 kg   SpO2 99%   BMI 30.68 kg/m   Physical Exam Vitals and nursing note reviewed.  Constitutional:      General: He is not in acute distress.    Appearance: He is well-developed.     Comments: Awake and alert, oriented x3  HENT:     Head: Normocephalic.     Comments: Small hematoma posterior scalp.    Mouth/Throat:     Pharynx: No oropharyngeal exudate.  Eyes:     Conjunctiva/sclera: Conjunctivae normal.     Pupils: Pupils are equal, round, and reactive to light.  Neck:     Comments: No meningismus. Cardiovascular:     Rate and Rhythm: Normal rate and regular rhythm.     Heart sounds: Normal heart sounds. No murmur heard.   Pulmonary:     Effort: Pulmonary effort is normal. No respiratory distress.     Breath sounds: Normal  breath sounds.  Abdominal:     Palpations: Abdomen is soft.     Tenderness: There is no abdominal tenderness. There is no guarding or rebound.  Musculoskeletal:        General: No tenderness. Normal range of motion.     Cervical back: Normal range of motion and neck supple.     Comments: No T or L-spine tenderness, full range of motion of hips without pain  Skin:  General: Skin is warm.  Neurological:     Mental Status: He is alert and oriented to person, place, and time.     Cranial Nerves: No cranial nerve deficit.     Motor: No abnormal muscle tone.     Coordination: Coordination normal.     Comments: No ataxia on finger to nose bilaterally. No pronator drift. 5/5 strength throughout. CN 2-12 intact.Equal grip strength. Sensation intact.   Psychiatric:        Behavior: Behavior normal.     ED Results / Procedures / Treatments   Labs (all labs ordered are listed, but only abnormal results are displayed) Labs Reviewed  COMPREHENSIVE METABOLIC PANEL - Abnormal; Notable for the following components:      Result Value   Glucose, Bld 165 (*)    BUN 29 (*)    Creatinine, Ser 1.67 (*)    GFR, Estimated 41 (*)    All other components within normal limits  URINALYSIS, ROUTINE W REFLEX MICROSCOPIC - Abnormal; Notable for the following components:   Protein, ur 30 (*)    All other components within normal limits  CBG MONITORING, ED - Abnormal; Notable for the following components:   Glucose-Capillary 125 (*)    All other components within normal limits  RESP PANEL BY RT-PCR (FLU A&B, COVID) ARPGX2  ETHANOL  PROTIME-INR  APTT  CBC  DIFFERENTIAL  RAPID URINE DRUG SCREEN, HOSP PERFORMED  LIPID PANEL    EKG EKG Interpretation  Date/Time:  Monday January 07 2021 21:42:16 EDT Ventricular Rate:  76 PR Interval:    QRS Duration: 103 QT Interval:  389 QTC Calculation: 438 R Axis:   -57 Text Interpretation: Sinus rhythm Ventricular bigeminy Left anterior fascicular block  Consider anterior infarct No significant change was found Confirmed by Glynn Octave [phone]) on 01/08/2021 12:01:31 AM   Radiology CT HEAD WO CONTRAST  Result Date: 01/07/2021 CLINICAL DATA:  Fall at home EXAM: CT HEAD WITHOUT CONTRAST TECHNIQUE: Contiguous axial images were obtained from the base of the skull through the vertex without intravenous contrast. COMPARISON:  None. FINDINGS: Brain: No evidence of acute territorial infarction, hemorrhage, hydrocephalus,extra-axial collection or mass lesion/mass effect. There is dilatation the ventricles and sulci consistent with age-related atrophy. Low-attenuation changes in the deep white matter consistent with small vessel ischemia. Vascular: No hyperdense vessel or unexpected calcification. Skull: The skull is intact. No fracture or focal lesion identified. Sinuses/Orbits: The visualized paranasal sinuses and mastoid air cells are clear. The orbits and globes intact. Other: None IMPRESSION: No acute intracranial abnormality. Findings consistent with age related atrophy and chronic small vessel ischemia Electronically Signed   By: Jonna Clark M.D.   On: 01/07/2021 23:54    Procedures Procedures   Medications Ordered in ED Medications - No data to display  ED Course  I have reviewed the triage vital signs and the nursing notes.  Pertinent labs & imaging results that were available during my care of the patient were reviewed by me and considered in my medical decision making (see chart for details).    MDM Rules/Calculators/A&P                         Episode of confusion with possible aphasia and left arm weakness followed by difficulty walking and fall. Now neurologically back to baseline. Patient denies any pain.  No preceding chest pain.  Code stroke not activated due to resolved symptoms  CT head is negative.  EKG shows PVCs  but no acute ischemia.  Labs reassuring.  Creatinine at baseline.  No evidence of traumatic  injury.  Discussed with neurology Dr. Izola Price who favors TIA versus CVA versus arrhythmia and recommends inpatient CVA work-up. Diagnostic Studies: Recommend MRI brain without contrast Routine MRA head without contrast and MRA Neck with contrast Transthoracic Echo with bubble study, if available  Patient agreeable to overnight observation admission for TIA work-up.  Discussed with Dr. Carren Rang.   Final Clinical Impression(s) / ED Diagnoses Final diagnoses:  Fall, initial encounter  TIA (transient ischemic attack)    Rx / DC Orders ED Discharge Orders    None       Alisan Dokes, Jeannett Senior, MD 01/08/21 [phone]

## 2021-01-07 NOTE — ED Notes (Signed)
Patient transported to CT 

## 2021-01-08 ENCOUNTER — Observation Stay (HOSPITAL_COMMUNITY): Payer: Medicare PPO

## 2021-01-08 DIAGNOSIS — G459 Transient cerebral ischemic attack, unspecified: Secondary | ICD-10-CM

## 2021-01-08 DIAGNOSIS — I161 Hypertensive emergency: Secondary | ICD-10-CM | POA: Diagnosis present

## 2021-01-08 DIAGNOSIS — I10 Essential (primary) hypertension: Secondary | ICD-10-CM

## 2021-01-08 DIAGNOSIS — E1159 Type 2 diabetes mellitus with other circulatory complications: Secondary | ICD-10-CM

## 2021-01-08 LAB — LIPID PANEL
Cholesterol: 178 mg/dL (ref 0–200)
HDL: 32 mg/dL — ABNORMAL LOW (ref >40)
LDL Cholesterol: 113 mg/dL — ABNORMAL HIGH (ref 0–99)
Total CHOL/HDL Ratio: 5.6 RATIO
Triglycerides: 167 mg/dL — ABNORMAL HIGH (ref <150)
VLDL: 33 mg/dL (ref 0–40)

## 2021-01-08 LAB — PROTIME-INR
INR: 0.9 (ref 0.8–1.2)
Prothrombin Time: 12.1 seconds (ref 11.4–15.2)

## 2021-01-08 LAB — CBC
HCT: 46.8 % (ref 39.0–52.0)
Hemoglobin: 15.1 g/dL (ref 13.0–17.0)
MCH: 30.1 pg (ref 26.0–34.0)
MCHC: 32.3 g/dL (ref 30.0–36.0)
MCV: 93.2 fL (ref 80.0–100.0)
Platelets: 213 10*3/uL (ref 150–400)
RBC: 5.02 MIL/uL (ref 4.22–5.81)
RDW: 13.4 % (ref 11.5–15.5)
WBC: 9.9 10*3/uL (ref 4.0–10.5)
nRBC: 0 % (ref 0.0–0.2)

## 2021-01-08 LAB — DIFFERENTIAL
Abs Immature Granulocytes: 0.04 10*3/uL (ref 0.00–0.07)
Basophils Absolute: 0.1 10*3/uL (ref 0.0–0.1)
Basophils Relative: 1 %
Eosinophils Absolute: 0.3 10*3/uL (ref 0.0–0.5)
Eosinophils Relative: 3 %
Immature Granulocytes: 0 %
Lymphocytes Relative: 39 %
Lymphs Abs: 3.8 10*3/uL (ref 0.7–4.0)
Monocytes Absolute: 0.9 10*3/uL (ref 0.1–1.0)
Monocytes Relative: 9 %
Neutro Abs: 4.8 10*3/uL (ref 1.7–7.7)
Neutrophils Relative %: 48 %

## 2021-01-08 LAB — COMPREHENSIVE METABOLIC PANEL
ALT: 14 U/L (ref 0–44)
AST: 20 U/L (ref 15–41)
Albumin: 4 g/dL (ref 3.5–5.0)
Alkaline Phosphatase: 48 U/L (ref 38–126)
Anion gap: 10 (ref 5–15)
BUN: 29 mg/dL — ABNORMAL HIGH (ref 8–23)
CO2: 24 mmol/L (ref 22–32)
Calcium: 8.9 mg/dL (ref 8.9–10.3)
Chloride: 104 mmol/L (ref 98–111)
Creatinine, Ser: 1.67 mg/dL — ABNORMAL HIGH (ref 0.61–1.24)
GFR, Estimated: 41 mL/min — ABNORMAL LOW (ref >60)
Glucose, Bld: 165 mg/dL — ABNORMAL HIGH (ref 70–99)
Potassium: 4.5 mmol/L (ref 3.5–5.1)
Sodium: 138 mmol/L (ref 135–145)
Total Bilirubin: 0.4 mg/dL (ref 0.3–1.2)
Total Protein: 7.5 g/dL (ref 6.5–8.1)

## 2021-01-08 LAB — GLUCOSE, CAPILLARY
Glucose-Capillary: 147 mg/dL — ABNORMAL HIGH (ref 70–99)
Glucose-Capillary: 162 mg/dL — ABNORMAL HIGH (ref 70–99)
Glucose-Capillary: 107 mg/dL — ABNORMAL HIGH (ref 70–99)

## 2021-01-08 LAB — ECHOCARDIOGRAM COMPLETE
AR max vel: 3.9 cm2
AV Area VTI: 3.91 cm2
AV Area mean vel: 4.02 cm2
AV Mean grad: 1.8 mmHg
AV Peak grad: 3.5 mmHg
Ao pk vel: 0.94 m/s
Area-P 1/2: 3.48 cm2
Height: 71 in
S' Lateral: 3.5 cm
Weight: 3495.61 oz

## 2021-01-08 LAB — URINALYSIS, ROUTINE W REFLEX MICROSCOPIC
Bacteria, UA: NONE SEEN
Bilirubin Urine: NEGATIVE
Glucose, UA: NEGATIVE mg/dL
Hgb urine dipstick: NEGATIVE
Ketones, ur: NEGATIVE mg/dL
Leukocytes,Ua: NEGATIVE
Nitrite: NEGATIVE
Protein, ur: 30 mg/dL — AB
Specific Gravity, Urine: 1.02 (ref 1.005–1.030)
pH: 5 (ref 5.0–8.0)

## 2021-01-08 LAB — RAPID URINE DRUG SCREEN, HOSP PERFORMED
Amphetamines: NOT DETECTED
Barbiturates: NOT DETECTED
Benzodiazepines: NOT DETECTED
Cocaine: NOT DETECTED
Opiates: NOT DETECTED
Tetrahydrocannabinol: NOT DETECTED

## 2021-01-08 LAB — APTT: aPTT: 29 seconds (ref 24–36)

## 2021-01-08 LAB — RESP PANEL BY RT-PCR (FLU A&B, COVID) ARPGX2
Influenza A by PCR: NEGATIVE
Influenza B by PCR: NEGATIVE
SARS Coronavirus 2 by RT PCR: NEGATIVE

## 2021-01-08 LAB — CBG MONITORING, ED: Glucose-Capillary: 125 mg/dL — ABNORMAL HIGH (ref 70–99)

## 2021-01-08 LAB — ETHANOL: Alcohol, Ethyl (B): <10 mg/dL (ref <10)

## 2021-01-08 MED ORDER — PERFLUTREN LIPID MICROSPHERE
1.0000 mL | INTRAVENOUS | Status: AC | PRN
  Administered 2021-01-08: 2 mL via INTRAVENOUS
  Filled 2021-01-08: qty 10

## 2021-01-08 MED ORDER — ACETAMINOPHEN 325 MG PO TABS: 650.0000 mg | ORAL_TABLET | ORAL | Status: DC | PRN

## 2021-01-08 MED ORDER — SENNOSIDES-DOCUSATE SODIUM 8.6-50 MG PO TABS: 1.0000 | ORAL_TABLET | Freq: Every evening | ORAL | Status: DC | PRN

## 2021-01-08 MED ORDER — STROKE: EARLY STAGES OF RECOVERY BOOK
Freq: Once | Status: AC
  Filled 2021-01-08: qty 1

## 2021-01-08 MED ORDER — INSULIN ASPART 100 UNIT/ML ~~LOC~~ SOLN: 0.0000 [IU] | Freq: Every day | SUBCUTANEOUS | Status: DC

## 2021-01-08 MED ORDER — ACETAMINOPHEN 650 MG RE SUPP: 650.0000 mg | RECTAL | Status: DC | PRN

## 2021-01-08 MED ORDER — CLOPIDOGREL BISULFATE 75 MG PO TABS
75.0000 mg | ORAL_TABLET | Freq: Every day | ORAL | Status: DC
  Administered 2021-01-09 – 2021-01-11 (×3): 75 mg via ORAL
  Filled 2021-01-08 (×3): qty 1

## 2021-01-08 MED ORDER — LORAZEPAM 2 MG/ML IJ SOLN
1.0000 mg | Freq: Once | INTRAMUSCULAR | Status: AC
  Administered 2021-01-08: 1 mg via INTRAVENOUS
  Filled 2021-01-08: qty 1

## 2021-01-08 MED ORDER — ASPIRIN EC 81 MG PO TBEC
81.0000 mg | DELAYED_RELEASE_TABLET | Freq: Every day | ORAL | Status: DC
  Administered 2021-01-08 – 2021-01-11 (×4): 81 mg via ORAL
  Filled 2021-01-08 (×4): qty 1

## 2021-01-08 MED ORDER — ACETAMINOPHEN 160 MG/5ML PO SOLN: 650.0000 mg | ORAL | Status: DC | PRN

## 2021-01-08 MED ORDER — ATORVASTATIN CALCIUM 40 MG PO TABS
40.0000 mg | ORAL_TABLET | Freq: Every evening | ORAL | Status: DC
  Administered 2021-01-08 – 2021-01-10 (×3): 40 mg via ORAL
  Filled 2021-01-08 (×3): qty 1

## 2021-01-08 MED ORDER — INSULIN ASPART 100 UNIT/ML ~~LOC~~ SOLN
0.0000 [IU] | Freq: Three times a day (TID) | SUBCUTANEOUS | Status: DC
  Administered 2021-01-08: 1 [IU] via SUBCUTANEOUS
  Administered 2021-01-08: 2 [IU] via SUBCUTANEOUS
  Administered 2021-01-09 (×2): 1 [IU] via SUBCUTANEOUS
  Administered 2021-01-09: 2 [IU] via SUBCUTANEOUS
  Administered 2021-01-10 – 2021-01-11 (×4): 1 [IU] via SUBCUTANEOUS
  Administered 2021-01-11: 3 [IU] via SUBCUTANEOUS

## 2021-01-08 MED ORDER — ENALAPRIL MALEATE 5 MG PO TABS
2.5000 mg | ORAL_TABLET | Freq: Every day | ORAL | Status: DC
  Administered 2021-01-08 – 2021-01-10 (×3): 2.5 mg via ORAL
  Filled 2021-01-08 (×6): qty 1

## 2021-01-08 MED ORDER — MIDODRINE HCL 5 MG PO TABS
5.0000 mg | ORAL_TABLET | Freq: Three times a day (TID) | ORAL | Status: DC
Start: 2021-01-09 — End: 2021-01-10
  Administered 2021-01-09 – 2021-01-10 (×6): 5 mg via ORAL
  Filled 2021-01-08 (×6): qty 1

## 2021-01-08 MED ORDER — CLOPIDOGREL BISULFATE 75 MG PO TABS
300.0000 mg | ORAL_TABLET | Freq: Once | ORAL | Status: AC
  Administered 2021-01-08: 300 mg via ORAL
  Filled 2021-01-08: qty 4

## 2021-01-08 MED ORDER — GADOBUTROL 1 MMOL/ML IV SOLN
10.0000 mL | Freq: Once | INTRAVENOUS | Status: AC | PRN
  Administered 2021-01-08: 10 mL via INTRAVENOUS

## 2021-01-08 MED ORDER — HEPARIN SODIUM (PORCINE) 5000 UNIT/ML IJ SOLN
5000.0000 [IU] | Freq: Three times a day (TID) | INTRAMUSCULAR | Status: DC
  Administered 2021-01-08 – 2021-01-11 (×9): 5000 [IU] via SUBCUTANEOUS
  Filled 2021-01-08 (×10): qty 1

## 2021-01-08 NOTE — Progress Notes (Signed)
   01/08/21 1300  What Happened  Was fall witnessed? No  Was patient injured? No  Patient found on floor  Found by Staff-comment Elease Hashimoto, [facility], [place])  Stated prior activity ambulating-unassisted  Follow Up  MD notified Clanford Johnson  Time MD notified [id number]  Family notified Yes - comment (family in room)  Progress note created (see row info) Yes  Adult Fall Risk Assessment  Risk Factor Category (scoring not indicated) Fall has occurred during this admission (document High fall risk)  Age 80  Fall History: Fall within 6 months prior to admission 5  Elimination; Bowel and/or Urine Incontinence 2  Elimination; Bowel and/or Urine Urgency/Frequency 0  Medications: includes PCA/Opiates, Anti-convulsants, Anti-hypertensives, Diuretics, Hypnotics, Laxatives, Sedatives, and Psychotropics 0  Patient Care Equipment 2  Mobility-Assistance 2  Mobility-Gait 2  Mobility-Sensory Deficit 0  Altered awareness of immediate physical environment 0  Impulsiveness 2  Lack of understanding of one's physical/cognitive limitations 4  Total Score 21  Patient Fall Risk Level High fall risk  NIH Stroke Scale ( + Modified Stroke Scale Criteria)   LOC Questions (1b. )   + 0  LOC Commands (1c. )   +  0  Best Gaze (2. )  + 0  Visual (3. )  + 0  Motor Arm, Left (5a. )   + 1  Motor Arm, Right (5b. )   + 0  Motor Leg, Left (6a. )   + 1  Motor Leg, Right (6b. )   + 0  Sensory (8. )   + 0  Best Language (9. )   + 0  Extinction/Inattention (11.)   + 0  Modified SS Total  + 2

## 2021-01-08 NOTE — Progress Notes (Addendum)
Patient in room confused, getting out of bed without assistance and physically aggressive with staff. Patient believes he is in his home and see his wife and is trying to get to where he wants to be in his home. Patient was very belligerent and physical with staff, swinging his arms and attempting to hit staff.  Patient is non directable.  MD notified for orders. Wife notified. Patient did speak to wife and situation explained to wife. Wife stated she had noted patients increased confusion earlier in day with their interactions and confusion seemed to worsen as day progressed.  Wife verbalized understanding.

## 2021-01-08 NOTE — Consult Note (Signed)
[facility] Robey Massmann A. Gerilyn Pilgrim, MD     [email or web address]          Hatim Homann is an 80 y.o. male.   ASSESSMENT/PLAN: 1.  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  Midodrine 5 mg 3 times daily is recommended.  Thigh-high support stocking approximately 15-20 mmHg is also recommended if available.  Physiotherapy as recommended by PT.   2.  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  Dual antiplatelet agents are recommended for 3 weeks.  Afterwards aspirin is recommended.  Continue with statin medication, blood pressure and diabetes control. 3.  Multivessel intracranial occlusive disease     Patient is a 80 year old white male who presents with dizziness on standing over the last couple of days.  The patient reports leaning towards the right.  He does not indicate having focal numbness or weakness.  The patient's has fallen a couple times once in the hospital.  No loss of consciousness is reported.  He denies palpitation, chest pain, dysarthria or dysphagia although it appears the patient has had some brief intermittent confusion in the hospital today.  The hospital spell seem to be associated with the the patient's position either sitting or standing.  He seems to do very well when he is laying in bed.  He currently is on aspirin and has been compliant with this.  He took Plavix many years ago and had some intolerable side effects.  The review of systems otherwise negative.   GENERAL: Pleasant male who is doing well at this time.  HEENT: Neck is supple no trauma noted.  ABDOMEN: soft  EXTREMITIES: No edema; significant arthritic changes of the hands  BACK: Normal  SKIN: Normal by inspection.    MENTAL STATUS: Alert and oriented -including orientation to his age and the month. Speech, language and cognition are generally intact. Judgment and insight  normal.   CRANIAL NERVES: Pupils are equal, round and reactive to light and accomodation; extra ocular movements are full, there is no significant nystagmus; visual fields are full; upper and lower facial muscles are normal in strength and symmetric, there is no flattening of the nasolabial folds; tongue is midline; uvula is midline; shoulder elevation is normal.  MOTOR: Normal tone, bulk and strength; no pronator drift.  COORDINATION: Left finger to nose is normal, right finger to nose is normal, No rest tremor; no intention tremor; no postural tremor; no bradykinesia.  SENSATION: Normal to light touch, temperature, and pain.    NIH stroke scale 0        ORTHOSTATICS   LYING DOWN BLOOD PRESSURE 176/90 HEART RATE 77   STANDING BLOOD PRESSURE 118/79 HEART RATE 78   STANDING AFTER 3 MINUTES BLOOD PRESSURE 110/70 HEART RATE 80       Blood pressure (!) 163/97, pulse 70, temperature 98.2 F (36.8 C), temperature source Oral, resp. rate 18, height 5\' 11"  (1.803 m), weight 99.1 kg, SpO2 99 %.  Past Medical History:  Diagnosis Date  . Abnormal echocardiogram 03/11/2016  . Acute MI anterior wall first episode care [facility])   . CAD (coronary artery disease) 12/10/2015  . CAD (coronary artery disease), native coronary artery   . Diabetes (HCC) 12/10/2015  . Essential hypertension 09/03/2017  . Hyperlipidemia   . 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 Type II diabetes mellitus (HCC)    a. Dx ~ 2010.    Past Surgical History:  Procedure Laterality Date  . CARDIAC  CATHETERIZATION N/A 12/08/2015   Procedure: Left Heart Cath and Coronary Angiography;  Surgeon: Corky Crafts, MD;  Location: [facility] INVASIVE CV LAB;  Service: Cardiovascular;  Laterality: N/A;  . CARDIAC CATHETERIZATION  12/08/2015   Procedure: Coronary Stent Intervention;  Surgeon: Corky Crafts, MD;  Location: [facility] INVASIVE CV LAB;  Service: Cardiovascular;;  . CERVICAL SPINE SURGERY     a. ~ 2002.  . Right  Knee Surgery     a. 08/2015.    Family History  Problem Relation Age of Onset  . Cancer Father        died in his 52's.  . Dementia Mother        died in her 47's.    Social History:  reports that he has quit smoking. His smoking use included cigarettes. He has a 30.00 pack-year smoking history. He has never used smokeless tobacco. He reports current alcohol use. He reports that he does not use drugs.  Allergies:  Allergies  Allergen Reactions  . Sulfa Antibiotics Hives    Medications: Prior to Admission medications   Medication Sig Start Date End Date Taking? Authorizing Provider  acetaminophen (TYLENOL) 325 MG tablet Take 650 mg by mouth every 6 (six) hours as needed for mild pain, moderate pain or headache.   Yes [provider]  aspirin EC 81 MG tablet Take 81 mg by mouth daily. 12/17/15  Yes [provider]  enalapril (VASOTEC) 5 MG tablet Take 2.5 mg by mouth daily.   Yes [provider]  glipiZIDE (GLUCOTROL XL) 5 MG 24 hr tablet Take 5 mg by mouth daily with breakfast.   Yes [provider]  metFORMIN (GLUCOPHAGE) 1000 MG tablet Take 500 mg by mouth 2 (two) times daily. 10/27/15  Yes [provider]  tamsulosin (FLOMAX) 0.4 MG CAPS capsule Take 0.8 mg by mouth daily. 10/29/20  Yes [provider]  nitroGLYCERIN (NITROSTAT) 0.4 MG SL tablet Place 0.4 mg under the tongue every 5 (five) minutes as needed for chest pain (MAX 3 TABLETS IN 15 MINS).    [provider]    Scheduled Meds: . aspirin EC  81 mg Oral Daily  . atorvastatin  40 mg Oral QPM  . [START ON 01/09/2021] clopidogrel  75 mg Oral Daily  . enalapril  2.5 mg Oral Daily  . heparin  5,000 Units Subcutaneous Q8H  . insulin aspart  0-5 Units Subcutaneous QHS  . insulin aspart  0-9 Units Subcutaneous TID WC   Continuous Infusions: PRN Meds:.acetaminophen **OR** acetaminophen (TYLENOL) oral liquid 160 mg/5 mL **OR** acetaminophen,  senna-docusate     Results for orders placed or performed during the hospital encounter of 01/07/21 (from the past 48 hour(s))  Ethanol     Status: None   Collection Time: 01/07/21  9:00 PM  Result Value Ref Range   Alcohol, Ethyl (B) <10 <10 mg/dL    Comment: (NOTE) Lowest detectable limit for serum alcohol is 10 mg/dL.  For medical purposes only. Performed at [facility] [place], [address] [place], [place] [address]   Protime-INR     Status: None   Collection Time: 01/07/21  9:00 PM  Result Value Ref Range   Prothrombin Time 12.1 11.4 - 15.2 seconds   INR 0.9 0.8 - 1.2    Comment: (NOTE) INR goal varies based on device and disease states. Performed at [facility], [address], [place], [place] [address]   APTT     Status: None   Collection Time:  01/07/21  9:00 PM  Result Value Ref Range   aPTT 29 24 - 36 seconds    Comment: Performed at [facility], [address] [place], [place] [address]  CBC     Status: None   Collection Time: 01/07/21  9:00 PM  Result Value Ref Range   WBC 9.9 4.0 - 10.5 K/uL   RBC 5.02 4.22 - 5.81 MIL/uL   Hemoglobin 15.1 13.0 - 17.0 g/dL   HCT 94.5 85.9 - 29.2 %   MCV 93.2 80.0 - 100.0 fL   MCH 30.1 26.0 - 34.0 pg   MCHC 32.3 30.0 - 36.0 g/dL   RDW 44.6 28.6 - 38.1 %   Platelets 213 150 - 400 K/uL   nRBC 0.0 0.0 - 0.2 %    Comment: Performed at [facility], [address] [place], [place] [address]  Differential     Status: None   Collection Time: 01/07/21  9:00 PM  Result Value Ref Range   Neutrophils Relative % 48 %   Neutro Abs 4.8 1.7 - 7.7 K/uL   Lymphocytes Relative 39 %   Lymphs Abs 3.8 0.7 - 4.0 K/uL   Monocytes Relative 9 %   Monocytes Absolute 0.9 0.1 - 1.0 K/uL   Eosinophils Relative 3 %   Eosinophils Absolute 0.3 0.0 - 0.5 K/uL   Basophils Relative 1 %   Basophils Absolute 0.1 0.0 - 0.1 K/uL   Immature Granulocytes 0 %   Abs Immature Granulocytes 0.04 0.00 - 0.07 K/uL    Comment: Performed at [facility], [address] [place], [place] [address]  Comprehensive metabolic panel     Status: Abnormal   Collection Time: 01/07/21  9:00 PM  Result Value Ref Range   Sodium 138 135 - 145 mmol/L   Potassium 4.5 3.5 - 5.1 mmol/L   Chloride 104 98 - 111 mmol/L   CO2 24 22 - 32 mmol/L   Glucose, Bld 165 (H) 70 - 99 mg/dL    Comment: Glucose reference range applies only to samples taken after fasting for at least 8 hours.   BUN 29 (H) 8 - 23 mg/dL   Creatinine, Ser 8.33 (H) 0.61 - 1.24 mg/dL   Calcium 8.9 8.9 - 38.3 mg/dL   Total Protein 7.5 6.5 - 8.1 g/dL   Albumin 4.0 3.5 - 5.0 g/dL   AST 20 15 - 41 U/L   ALT 14 0 - 44 U/L   Alkaline Phosphatase 48 38 - 126 U/L   Total Bilirubin 0.4 0.3 - 1.2 mg/dL   GFR, Estimated 41 (L) >60 mL/min    Comment: (NOTE) Calculated using the CKD-EPI Creatinine Equation (2021)    Anion gap 10 5 - 15    Comment: Performed at [facility], [address] [place], [place] [address]  CBG monitoring, ED     Status: Abnormal   Collection Time: 01/08/21 12:31 AM  Result Value Ref Range   Glucose-Capillary 125 (H) 70 - 99 mg/dL    Comment: Glucose reference range applies only to samples taken after fasting for at least 8 hours.  Resp Panel by RT-PCR (Flu A&B, Covid) Nasopharyngeal Swab     Status: None   Collection Time: 01/08/21  2:00 AM   Specimen: Nasopharyngeal Swab; Nasopharyngeal(NP) swabs in vial transport medium  Result Value Ref Range   SARS Coronavirus 2 by RT PCR NEGATIVE NEGATIVE    Comment: (NOTE) SARS-CoV-2 target nucleic acids are NOT DETECTED.  The SARS-CoV-2 RNA is  generally detectable in upper respiratory specimens during the acute phase of infection. The lowest concentration of SARS-CoV-2 viral copies this assay can detect is 138 copies/mL. A negative result does not preclude SARS-Cov-2 infection and should not be used as the sole basis for treatment or other patient management decisions. A negative result may occur with  improper specimen  collection/handling, submission of specimen other than nasopharyngeal swab, presence of viral mutation(s) within the areas targeted by this assay, and inadequate number of viral copies(<138 copies/mL). A negative result must be combined with clinical observations, patient history, and epidemiological information. The expected result is Negative.  Fact Sheet for Patients:  BloggerCourse.comhttps://www.fda.gov/media/152166/download  Fact Sheet for Healthcare Providers:  SeriousBroker.ithttps://www.fda.gov/media/152162/download  This test is no t yet approved or cleared by the [place] FDA and  has been authorized for detection and/or diagnosis of SARS-CoV-2 by FDA under an Emergency Use Authorization (EUA). This EUA will remain  in effect (meaning this test can be used) for the duration of the COVID-19 declaration under Section 564(b)(1) of the Act, 21 U.S.C.section 360bbb-3(b)(1), unless the authorization is terminated  or revoked sooner.       Influenza A by PCR NEGATIVE NEGATIVE   Influenza B by PCR NEGATIVE NEGATIVE    Comment: (NOTE) The Xpert Xpress SARS-CoV-2/FLU/RSV plus assay is intended as an aid in the diagnosis of influenza from Nasopharyngeal swab specimens and should not be used as a sole basis for treatment. Nasal washings and aspirates are unacceptable for Xpert Xpress SARS-CoV-2/FLU/RSV testing.  Fact Sheet for Patients: BloggerCourse.comhttps://www.fda.gov/media/152166/download  Fact Sheet for Healthcare Providers: SeriousBroker.ithttps://www.fda.gov/media/152162/download  This test is not yet approved or cleared by the [place] FDA and has been authorized for detection and/or diagnosis of SARS-CoV-2 by FDA under an Emergency Use Authorization (EUA). This EUA will remain in effect (meaning this test can be used) for the duration of the COVID-19 declaration under Section 564(b)(1) of the Act, 21 U.S.C. section 360bbb-3(b)(1), unless the authorization is terminated or revoked.  Performed at [facility],  [address] [place], [place] [address]   Urine rapid drug screen (hosp performed)     Status: None   Collection Time: 01/08/21  2:47 AM  Result Value Ref Range   Opiates NONE DETECTED NONE DETECTED   Cocaine NONE DETECTED NONE DETECTED   Benzodiazepines NONE DETECTED NONE DETECTED   Amphetamines NONE DETECTED NONE DETECTED   Tetrahydrocannabinol NONE DETECTED NONE DETECTED   Barbiturates NONE DETECTED NONE DETECTED    Comment: (NOTE) DRUG SCREEN FOR MEDICAL PURPOSES ONLY.  IF CONFIRMATION IS NEEDED FOR ANY PURPOSE, NOTIFY LAB WITHIN 5 DAYS.  LOWEST DETECTABLE LIMITS FOR URINE DRUG SCREEN Drug Class                     Cutoff (ng/mL) Amphetamine and metabolites    1000 Barbiturate and metabolites    200 Benzodiazepine                 200 Tricyclics and metabolites     300 Opiates and metabolites        300 Cocaine and metabolites        300 THC                            50 Performed at [facility], [address] [place], [place] [address]   Urinalysis, Routine w reflex microscopic Urine, Clean Catch     Status: Abnormal   Collection Time:  01/08/21  2:47 AM  Result Value Ref Range   Color, Urine YELLOW YELLOW   APPearance CLEAR CLEAR   Specific Gravity, Urine 1.020 1.005 - 1.030   pH 5.0 5.0 - 8.0   Glucose, UA NEGATIVE NEGATIVE mg/dL   Hgb urine dipstick NEGATIVE NEGATIVE   Bilirubin Urine NEGATIVE NEGATIVE   Ketones, ur NEGATIVE NEGATIVE mg/dL   Protein, ur 30 (A) NEGATIVE mg/dL   Nitrite NEGATIVE NEGATIVE   Leukocytes,Ua NEGATIVE NEGATIVE   WBC, UA 0-5 0 - 5 WBC/hpf   Bacteria, UA NONE SEEN NONE SEEN   Mucus PRESENT     Comment: Performed at [facility], [address] [place], [place] [address]  Lipid panel     Status: Abnormal   Collection Time: 01/08/21  6:30 AM  Result Value Ref Range   Cholesterol 178 0 - 200 mg/dL   Triglycerides 370 (H) <150 mg/dL   HDL 32 (L) >48 mg/dL   Total CHOL/HDL Ratio 5.6 RATIO   VLDL 33 0 - 40 mg/dL   LDL Cholesterol 889 (H) 0 -  99 mg/dL    Comment:        Total Cholesterol/HDL:CHD Risk Coronary Heart Disease Risk Table                     Men   Women  1/2 Average Risk   3.4   3.3  Average Risk       5.0   4.4  2 X Average Risk   9.6   7.1  3 X Average Risk  23.4   11.0        Use the calculated Patient Ratio above and the CHD Risk Table to determine the patient's CHD Risk.        ATP III CLASSIFICATION (LDL):  <100     mg/dL   Optimal  169-450  mg/dL   Near or Above                    Optimal  130-159  mg/dL   Borderline  388-828  mg/dL   High  >003     mg/dL   Very High Performed at [facility], [address] [place], [place] [address]   Glucose, capillary     Status: Abnormal   Collection Time: 01/08/21  7:42 AM  Result Value Ref Range   Glucose-Capillary 147 (H) 70 - 99 mg/dL    Comment: Glucose reference range applies only to samples taken after fasting for at least 8 hours.  Glucose, capillary     Status: Abnormal   Collection Time: 01/08/21 11:49 AM  Result Value Ref Range   Glucose-Capillary 162 (H) 70 - 99 mg/dL    Comment: Glucose reference range applies only to samples taken after fasting for at least 8 hours.   Comment 1 Notify RN   Glucose, capillary     Status: Abnormal   Collection Time: 01/08/21  3:51 PM  Result Value Ref Range   Glucose-Capillary 107 (H) 70 - 99 mg/dL    Comment: Glucose reference range applies only to samples taken after fasting for at least 8 hours.   Comment 1 Notify RN    Comment 2 Document in Chart     Studies/Results:  TTE 1. Left ventricular ejection fraction, by estimation, is 50 to 55%. The  left ventricle has normal function. The left ventricle has no regional  wall motion abnormalities. There is mild left ventricular hypertrophy.  Left ventricular  diastolic parameters  are indeterminate.  2. Right ventricular systolic function is normal. The right ventricular  size is normal.  3. The mitral valve is normal in structure. No evidence of  mitral valve  regurgitation. No evidence of mitral stenosis.  4. The aortic valve is tricuspid. Aortic valve regurgitation is not  visualized. No aortic stenosis is present.  5. Aortic dilatation noted. There is mild dilatation of the aortic root,  measuring 42 mm.  6. The inferior vena cava is normal in size with greater than 50%  respiratory variability, suggesting right atrial pressure of 3 mmHg.      BRAIN MRI/MRA  NECK MRA FINDINGS: MRI HEAD  Brain: There are two small foci mildly reduced cortical diffusion in the right occipital lobe. No evidence of intracranial hemorrhage. Prominence of the ventricles and sulci reflects generalized parenchymal volume loss. 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 Minimal patchy T2 hyperintensity in the supratentorial white matter is nonspecific but may reflect minor chronic microvascular ischemic changes.  Ovoid as motion in the fall there is no intracranial mass, mass effect, or edema. There is no hydrocephalus or extra-axial fluid collection.  Vascular: Major vessel flow voids at the skull base are preserved.  Skull and upper cervical spine: Normal marrow signal is preserved.  Sinuses/Orbits: Paranasal sinuses are aerated. Orbits are unremarkable.  Other: Sella is unremarkable.  Mastoid air cells are clear.  MRA HEAD  Intracranial internal carotid arteries are patent. Middle and anterior cerebral arteries are patent. Intracranial vertebral arteries are patent. Basilar artery is patent. Basilar artery becomes diminutive after superior cerebellar artery origins terminating as a small right P1 PCA. Near fetal origin of the right PCA and fetal origin of the left PCA. There is high-grade stenosis of the proximal P2 PCA with no apparent distal flow related enhancement. On postcontrast MRA neck,  flow is likely present with additional P2 stenoses.  There is no aneurysm.  MRA NECK  Common, internal, and external carotid arteries are patent. Mild plaque at the right greater than left ICA origins without stenosis.  Vertebral artery origins are poorly evaluated. Otherwise patent and codominant without stenosis.  IMPRESSION: Two small acute right occipital lobe cortical infarcts. High-grade stenosis of the proximal right P2 PCA (near fetal origin right PCA).  Mild plaque at the ICA origins.  No stenosis.     The brain MRI and MRA and also imaging of the neck MRA are reviewed in person. There are 2 tiny cortical infarcts involving the right occipital lobe. There is mild to moderate atrophy and periventricular leukoencephalopathy consistent with the patient's age. MRA shows no significant extracranial carotid or vertebral disease. The intracranial vertebrobasilar system appears small in caliber with fetal circulation noted of the PCAs bilaterally which are also atherosclerotic appearing bilaterally.    Wren Pryce A. Gerilyn Pilgrim, M.D.  Diplomate, Biomedical engineer of Psychiatry and Neurology ( Neurology). 01/08/2021, 5:09 PM

## 2021-01-08 NOTE — H&P (Signed)
TRH H&P    Patient Demographics:    Shane Welch, is a 80 y.o. male  MRN: [id number]  DOB - [birth year]  Admit Date - 01/07/2021  Referring MD/NP/PA: Rancour  Outpatient Primary MD for the patient is Kaleen Mask, MD  Patient coming from: Home  Chief complaint-fall   HPI:    Shane Welch  is a 80 y.o. male, with history of type 2 diabetes mellitus, hyperlipidemia, essential hypertension, CAD, and more presents the ED with a chief complaint of fall.  Patient reports that around dinnertime he stood up to get something out of the pantry when he felt dizzy and fell backwards hitting his head on a cabinet.  He reports that he remembers the whole thing but he knows he felt very weak and thought he might blackout.  After hitting his head he has no changes in vision or hearing.  Prior to his fall he had no chest pain, palpitations.  He does report that prior to his fall he had dysarthria, and ataxia when walking to the pantry.  He thinks the whole thing may be lasted 5 minutes however his wife reported that it lasted longer.  Patient reports that this time he feels completely back to normal except for these tired.  Patient has never had symptoms like this before.  Patient has no other complaints at this time.  Patient does not smoke, does not drink alcohol, does not use illicit drugs.  He is not vaccinated for COVID.  Patient is DNR.  In the ED Temp 98.2, heart rate 51-84, respiratory rate 15-20, blood pressure as high as 190/97 White blood cell count 9.9, hemoglobin 15 Chemistry panel reveals an elevated creatinine of 1.67, baseline 1.5 ED provider spoke with telemetry neuro who recommended TIA work-up Telemetry neuro also recommended lipid panel, hemoglobin A1c, Plavix load followed by Plavix daily, aspirin daily Neurochecks every 4 hours then per shift had been elevated and orthostatic vital signs.    Review  of systems:    In addition to the HPI above,  No Fever-chills, No Headache, No changes with Vision or hearing, No problems swallowing food or Liquids, No Chest pain, Cough or Shortness of Breath, No Abdominal pain, No Nausea or Vomiting, bowel movements are regular, No Blood in stool or Urine, No dysuria, No new skin rashes or bruises, No new joints pains-aches,  Bilateral lower extremity weakness, no tingling, numbness in any extremity, No recent weight gain or loss, No polyuria, polydypsia or polyphagia, No significant Mental Stressors.  All other systems reviewed and are negative.    Past History of the following :    Past Medical History:  Diagnosis Date  . Abnormal echocardiogram 03/11/2016  . Acute MI anterior wall first episode care [facility])   . CAD (coronary artery disease) 12/10/2015  . CAD (coronary artery disease), native coronary artery   . Diabetes (HCC) 12/10/2015  . Essential hypertension 09/03/2017  . Hyperlipidemia   . 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 Type II diabetes mellitus (HCC)    a. Dx ~  2010.      Past Surgical History:  Procedure Laterality Date  . CARDIAC CATHETERIZATION N/A 12/08/2015   Procedure: Left Heart Cath and Coronary Angiography;  Surgeon: Corky Crafts, MD;  Location: [facility] INVASIVE CV LAB;  Service: Cardiovascular;  Laterality: N/A;  . CARDIAC CATHETERIZATION  12/08/2015   Procedure: Coronary Stent Intervention;  Surgeon: Corky Crafts, MD;  Location: [facility] INVASIVE CV LAB;  Service: Cardiovascular;;  . CERVICAL SPINE SURGERY     a. ~ 2002.  . Right Knee Surgery     a. 08/2015.      Social History:      Social History   Tobacco Use  . Smoking status: Former Smoker    Packs/day: 1.00    Years: 30.00    Pack years: 30.00    Types: Cigarettes  . Smokeless tobacco: Never Used  . Tobacco comment: quit 1993  Substance Use Topics  . Alcohol use: Yes    Alcohol/week: 0.0 standard drinks    Comment: rare drink        Family History :     Family History  Problem Relation Age of Onset  . Cancer Father        died in his 56's.  . Dementia Mother        died in her 13's.      Home Medications:   Prior to Admission medications   Medication Sig Start Date End Date Taking? Authorizing Provider  acetaminophen (TYLENOL) 325 MG tablet Take 650 mg by mouth every 6 (six) hours as needed for mild pain, moderate pain or headache.    [provider]  aspirin EC 81 MG tablet Take 81 mg by mouth daily. 12/17/15   [provider]  enalapril (VASOTEC) 5 MG tablet Take 2.5 mg by mouth daily.    [provider]  glipiZIDE (GLUCOTROL XL) 5 MG 24 hr tablet Take 5 mg by mouth daily with breakfast.    [provider]  metFORMIN (GLUCOPHAGE) 1000 MG tablet Take 500 mg by mouth 2 (two) times daily. 10/27/15   [provider]  nitroGLYCERIN (NITROSTAT) 0.4 MG SL tablet Place 0.4 mg under the tongue every 5 (five) minutes as needed for chest pain (MAX 3 TABLETS IN 15 MINS).    [provider]     Allergies:     Allergies  Allergen Reactions  . Sulfa Antibiotics Hives     Physical Exam:   Vitals  Blood pressure (!) 155/87, pulse 74, temperature 98 F (36.7 C), resp. rate 18, height 5\' 11"  (1.803 m), weight 99.1 kg, SpO2 100 %.  1.  General: Patient lying supine in bed in no acute distress, alert  2. Psychiatric: Alert and oriented x3, mood and behavior normal for situation, pleasant, cooperative with exam  3. Neurologic: Cranial nerves II through XII intact, equal strength in bilateral upper extremities, equal strength in bilateral lower extremities, equal sensation upper bilateral extremities, equal sensation in bilateral lower extremities, normal finger-to-nose, no acute deficit on limited exam  4. HEENMT:  Head is atraumatic, normocephalic, pupils reactive to light, neck is supple, trachea is midline, mucous membranes are moist  5. Respiratory  : Lungs are clear to auscultation bilaterally without rales, wheezes, rhonchi, no clubbing and no fingers or toes, no cyanosis  6. Cardiovascular : Heart rate is normal, rhythm is regular, no murmurs rubs or gallops, no peripheral edema, peripheral pulses palpated  7. Gastrointestinal:  Abdomen is soft, nondistended, nontender to palpation  8.  Skin:  Skin is warm dry and intact without acute lesion on limited exam  9.Musculoskeletal:  No acute deformity, no calf tenderness, peripheral pulses palpated, no peripheral edema    Data Review:    CBC Recent Labs  Lab 01/07/21 2100  WBC 9.9  HGB 15.1  HCT 46.8  PLT 213  MCV 93.2  MCH 30.1  MCHC 32.3  RDW 13.4  LYMPHSABS 3.8  MONOABS 0.9  EOSABS 0.3  BASOSABS 0.1   ------------------------------------------------------------------------------------------------------------------  Results for orders placed or performed during the hospital encounter of 01/07/21 (from the past 48 hour(s))  Ethanol     Status: None   Collection Time: 01/07/21  9:00 PM  Result Value Ref Range   Alcohol, Ethyl (B) <10 <10 mg/dL    Comment: (NOTE) Lowest detectable limit for serum alcohol is 10 mg/dL.  For medical purposes only. Performed at [facility] [place], [address] [place], [place] [address]   Protime-INR     Status: None   Collection Time: 01/07/21  9:00 PM  Result Value Ref Range   Prothrombin Time 12.1 11.4 - 15.2 seconds   INR 0.9 0.8 - 1.2    Comment: (NOTE) INR goal varies based on device and disease states. Performed at [facility], [address], [place], [place] [address]   APTT     Status: None   Collection Time: 01/07/21  9:00 PM  Result Value Ref Range   aPTT 29 24 - 36 seconds    Comment: Performed at [facility], [address], [place], [place] [address]  CBC     Status: None   Collection Time: 01/07/21  9:00 PM  Result Value Ref Range   WBC 9.9 4.0 - 10.5 K/uL   RBC 5.02 4.22 - 5.81 MIL/uL   Hemoglobin 15.1 13.0  - 17.0 g/dL   HCT 58.8 50.2 - 77.4 %   MCV 93.2 80.0 - 100.0 fL   MCH 30.1 26.0 - 34.0 pg   MCHC 32.3 30.0 - 36.0 g/dL   RDW 12.8 78.6 - 76.7 %   Platelets 213 150 - 400 K/uL   nRBC 0.0 0.0 - 0.2 %    Comment: Performed at [facility], [address] [place], [place] [address]  Differential     Status: None   Collection Time: 01/07/21  9:00 PM  Result Value Ref Range   Neutrophils Relative % 48 %   Neutro Abs 4.8 1.7 - 7.7 K/uL   Lymphocytes Relative 39 %   Lymphs Abs 3.8 0.7 - 4.0 K/uL   Monocytes Relative 9 %   Monocytes Absolute 0.9 0.1 - 1.0 K/uL   Eosinophils Relative 3 %   Eosinophils Absolute 0.3 0.0 - 0.5 K/uL   Basophils Relative 1 %   Basophils Absolute 0.1 0.0 - 0.1 K/uL   Immature Granulocytes 0 %   Abs Immature Granulocytes 0.04 0.00 - 0.07 K/uL    Comment: Performed at [facility], [address], [place], [place] [address]  Comprehensive metabolic panel     Status: Abnormal   Collection Time: 01/07/21  9:00 PM  Result Value Ref Range   Sodium 138 135 - 145 mmol/L   Potassium 4.5 3.5 - 5.1 mmol/L   Chloride 104 98 - 111 mmol/L   CO2 24 22 - 32 mmol/L   Glucose, Bld 165 (H) 70 - 99 mg/dL    Comment: Glucose reference range applies only to samples taken after fasting for at least 8 hours.   BUN  29 (H) 8 - 23 mg/dL   Creatinine, Ser 1.611.67 (H) 0.61 - 1.24 mg/dL   Calcium 8.9 8.9 - 09.610.3 mg/dL   Total Protein 7.5 6.5 - 8.1 g/dL   Albumin 4.0 3.5 - 5.0 g/dL   AST 20 15 - 41 U/L   ALT 14 0 - 44 U/L   Alkaline Phosphatase 48 38 - 126 U/L   Total Bilirubin 0.4 0.3 - 1.2 mg/dL   GFR, Estimated 41 (L) >60 mL/min    Comment: (NOTE) Calculated using the CKD-EPI Creatinine Equation (2021)    Anion gap 10 5 - 15    Comment: Performed at [facility], [address] [place], [place] [address]  CBG monitoring, ED     Status: Abnormal   Collection Time: 01/08/21 12:31 AM  Result Value Ref Range   Glucose-Capillary 125 (H) 70 - 99 mg/dL    Comment: Glucose reference  range applies only to samples taken after fasting for at least 8 hours.  Resp Panel by RT-PCR (Flu A&B, Covid) Nasopharyngeal Swab     Status: None   Collection Time: 01/08/21  2:00 AM   Specimen: Nasopharyngeal Swab; Nasopharyngeal(NP) swabs in vial transport medium  Result Value Ref Range   SARS Coronavirus 2 by RT PCR NEGATIVE NEGATIVE    Comment: (NOTE) SARS-CoV-2 target nucleic acids are NOT DETECTED.  The SARS-CoV-2 RNA is generally detectable in upper respiratory specimens during the acute phase of infection. The lowest concentration of SARS-CoV-2 viral copies this assay can detect is 138 copies/mL. A negative result does not preclude SARS-Cov-2 infection and should not be used as the sole basis for treatment or other patient management decisions. A negative result may occur with  improper specimen collection/handling, submission of specimen other than nasopharyngeal swab, presence of viral mutation(s) within the areas targeted by this assay, and inadequate number of viral copies(<138 copies/mL). A negative result must be combined with clinical observations, patient history, and epidemiological information. The expected result is Negative.  Fact Sheet for Patients:  BloggerCourse.comhttps://www.fda.gov/media/152166/download  Fact Sheet for Healthcare Providers:  SeriousBroker.ithttps://www.fda.gov/media/152162/download  This test is no t yet approved or cleared by the [place] FDA and  has been authorized for detection and/or diagnosis of SARS-CoV-2 by FDA under an Emergency Use Authorization (EUA). This EUA will remain  in effect (meaning this test can be used) for the duration of the COVID-19 declaration under Section 564(b)(1) of the Act, 21 U.S.C.section 360bbb-3(b)(1), unless the authorization is terminated  or revoked sooner.       Influenza A by PCR NEGATIVE NEGATIVE   Influenza B by PCR NEGATIVE NEGATIVE    Comment: (NOTE) The Xpert Xpress SARS-CoV-2/FLU/RSV plus assay is intended as an  aid in the diagnosis of influenza from Nasopharyngeal swab specimens and should not be used as a sole basis for treatment. Nasal washings and aspirates are unacceptable for Xpert Xpress SARS-CoV-2/FLU/RSV testing.  Fact Sheet for Patients: BloggerCourse.comhttps://www.fda.gov/media/152166/download  Fact Sheet for Healthcare Providers: SeriousBroker.ithttps://www.fda.gov/media/152162/download  This test is not yet approved or cleared by the [place] FDA and has been authorized for detection and/or diagnosis of SARS-CoV-2 by FDA under an Emergency Use Authorization (EUA). This EUA will remain in effect (meaning this test can be used) for the duration of the COVID-19 declaration under Section 564(b)(1) of the Act, 21 U.S.C. section 360bbb-3(b)(1), unless the authorization is terminated or revoked.  Performed at [facility], [address] [place], [place] [address]   Urine rapid drug screen (hosp performed)  Status: None   Collection Time: 01/08/21  2:47 AM  Result Value Ref Range   Opiates NONE DETECTED NONE DETECTED   Cocaine NONE DETECTED NONE DETECTED   Benzodiazepines NONE DETECTED NONE DETECTED   Amphetamines NONE DETECTED NONE DETECTED   Tetrahydrocannabinol NONE DETECTED NONE DETECTED   Barbiturates NONE DETECTED NONE DETECTED    Comment: (NOTE) DRUG SCREEN FOR MEDICAL PURPOSES ONLY.  IF CONFIRMATION IS NEEDED FOR ANY PURPOSE, NOTIFY LAB WITHIN 5 DAYS.  LOWEST DETECTABLE LIMITS FOR URINE DRUG SCREEN Drug Class                     Cutoff (ng/mL) Amphetamine and metabolites    1000 Barbiturate and metabolites    200 Benzodiazepine                 200 Tricyclics and metabolites     300 Opiates and metabolites        300 Cocaine and metabolites        300 THC                            50 Performed at [facility], [address] [place], [place] [address]   Urinalysis, Routine w reflex microscopic Urine, Clean Catch     Status: Abnormal   Collection Time: 01/08/21  2:47 AM  Result Value  Ref Range   Color, Urine YELLOW YELLOW   APPearance CLEAR CLEAR   Specific Gravity, Urine 1.020 1.005 - 1.030   pH 5.0 5.0 - 8.0   Glucose, UA NEGATIVE NEGATIVE mg/dL   Hgb urine dipstick NEGATIVE NEGATIVE   Bilirubin Urine NEGATIVE NEGATIVE   Ketones, ur NEGATIVE NEGATIVE mg/dL   Protein, ur 30 (A) NEGATIVE mg/dL   Nitrite NEGATIVE NEGATIVE   Leukocytes,Ua NEGATIVE NEGATIVE   WBC, UA 0-5 0 - 5 WBC/hpf   Bacteria, UA NONE SEEN NONE SEEN   Mucus PRESENT     Comment: Performed at [facility], [address] [place], [place] [address]    Chemistries  Recent Labs  Lab 01/07/21 2100  NA 138  K 4.5  CL 104  CO2 24  GLUCOSE 165*  BUN 29*  CREATININE 1.67*  CALCIUM 8.9  AST 20  ALT 14  ALKPHOS 48  BILITOT 0.4   ------------------------------------------------------------------------------------------------------------------  ------------------------------------------------------------------------------------------------------------------ GFR: Estimated Creatinine Clearance: 43 mL/min (A) (by C-G formula based on SCr of 1.67 mg/dL (H)). Liver Function Tests: Recent Labs  Lab 01/07/21 2100  AST 20  ALT 14  ALKPHOS 48  BILITOT 0.4  PROT 7.5  ALBUMIN 4.0   No results for input(s): LIPASE, AMYLASE in the last 168 hours. No results for input(s): AMMONIA in the last 168 hours. Coagulation Profile: Recent Labs  Lab 01/07/21 2100  INR 0.9   Cardiac Enzymes: No results for input(s): CKTOTAL, CKMB, CKMBINDEX, TROPONINI in the last 168 hours. BNP (last 3 results) No results for input(s): PROBNP in the last 8760 hours. HbA1C: No results for input(s): HGBA1C in the last 72 hours. CBG: Recent Labs  Lab 01/08/21 0031  GLUCAP 125*   Lipid Profile: No results for input(s): CHOL, HDL, LDLCALC, TRIG, CHOLHDL, LDLDIRECT in the last 72 hours. Thyroid Function Tests: No results for input(s): TSH, T4TOTAL, FREET4, T3FREE, THYROIDAB in the last 72 hours. Anemia Panel: No  results for input(s): VITAMINB12, FOLATE, FERRITIN, TIBC, IRON, RETICCTPCT in the last 72 hours.  --------------------------------------------------------------------------------------------------------------- Urine analysis:    Component Value Date/Time  COLORURINE YELLOW 01/08/2021 0247   APPEARANCEUR CLEAR 01/08/2021 0247   LABSPEC 1.020 01/08/2021 0247   PHURINE 5.0 01/08/2021 0247   GLUCOSEU NEGATIVE 01/08/2021 0247   HGBUR NEGATIVE 01/08/2021 0247   BILIRUBINUR NEGATIVE 01/08/2021 0247   KETONESUR NEGATIVE 01/08/2021 0247   PROTEINUR 30 (A) 01/08/2021 0247   NITRITE NEGATIVE 01/08/2021 0247   LEUKOCYTESUR NEGATIVE 01/08/2021 0247      Imaging Results:    CT HEAD WO CONTRAST  Result Date: 01/07/2021 CLINICAL DATA:  Fall at home EXAM: CT HEAD WITHOUT CONTRAST TECHNIQUE: Contiguous axial images were obtained from the base of the skull through the vertex without intravenous contrast. COMPARISON:  None. FINDINGS: Brain: No evidence of acute territorial infarction, hemorrhage, hydrocephalus,extra-axial collection or mass lesion/mass effect. There is dilatation the ventricles and sulci consistent with age-related atrophy. Low-attenuation changes in the deep white matter consistent with small vessel ischemia. Vascular: No hyperdense vessel or unexpected calcification. Skull: The skull is intact. No fracture or focal lesion identified. Sinuses/Orbits: The visualized paranasal sinuses and mastoid air cells are clear. The orbits and globes intact. Other: None IMPRESSION: No acute intracranial abnormality. Findings consistent with age related atrophy and chronic small vessel ischemia Electronically Signed   By: Jonna Clark M.D.   On: 01/07/2021 23:54    My personal review of EKG: Rhythm NSR, Rate 76/min, QTc 438,no Acute ST changes   Assessment & Plan:    Active Problems:   Diabetes [facility])   Essential hypertension   Hypertensive emergency   TIA (transient ischemic  attack)   1. TIA 1. Dysarthria, ataxia, dizziness 2. Blood pressure under 90/97 3. Telemetry neuro who advised TIA work-up with echo and MRI 4. Echo, MRI, ultrasound carotids pending 5. Monitor on telemetry 6. Patient is completely back to baseline 2. Hypertensive emergency 1. Patient reports no issues with his blood pressure over the last 2 years 2. Denies headache, did have dizziness, TIA symptoms as above 3. BP now improved without intervention to 150s over 80s  4. Continue to monitor 3. diabetes mellitus type 2 1. Last hemoglobin A1c was 6.8 2. Hold oral hypoglycemics, sliding scale coverage 3. Glucose 165 at admission 4. Carb modified heart healthy diet 5. Continue to monitor 4. Essential hypertension 1. Continue home ACE 2. Continue to monitor 5.    DVT Prophylaxis-Heparin- SCDs  AM Labs Ordered, also please review Full Orders  Family Communication: No family at bedside  Code Status: DNR  Admission status: Observation   Asia B Zierle-Ghosh DO

## 2021-01-08 NOTE — Plan of Care (Signed)
  Problem: Acute Rehab OT Goals (only OT should resolve) Goal: Pt. Will Perform Grooming Flowsheets (Taken 01/08/2021 1000) Pt Will Perform Grooming:  standing  with modified independence Goal: Pt. Will Transfer To Toilet Flowsheets (Taken 01/08/2021 1000) Pt Will Transfer to Toilet:  stand pivot transfer  with modified independence   Symeon Puleo OT, MOT

## 2021-01-08 NOTE — Evaluation (Signed)
Occupational Therapy Evaluation Patient Details Name: Shane Welch MRN: [id number] DOB: 1941/10/10 Today's Date: 01/08/2021    History of Present Illness Shane Welch  is a 80 y.o. male, with history of type 2 diabetes mellitus, hyperlipidemia, essential hypertension, CAD, and more presents the ED with a chief complaint of fall.  Patient reports that around dinnertime he stood up to get something out of the pantry when he felt dizzy and fell backwards hitting his head on a cabinet.  He reports that he remembers the whole thing but he knows he felt very weak and thought he might blackout.  After hitting his head he has no changes in vision or hearing.  Prior to his fall he had no chest pain, palpitations.  He does report that prior to his fall he had dysarthria, and ataxia when walking to the pantry.  He thinks the whole thing may be lasted 5 minutes however his wife reported that it lasted longer.  Patient reports that this time he feels completely back to normal except for these tired.  Patient has never had symptoms like this before.  Patient has no other complaints at this time.   Clinical Impression   Pt was agreeable to OT evaluation this date. Pt primarily limited due to orthostatic hypotension related. Pt BP when from 160/83 in sitting to 118/78 while standing. Pt needed to sit in a w/c during functional ambulation due to reports of dizziness and LOB when ambulating in the hall. Min guard to min A needed during ambulation and transfers due to BP related issues. Pt was able to doff socks Mod I in bed but required Min A to don grip socks due to the socks being slightly too small. Pt recommended for continued occupational therapy in hospital setting as well as in the setting below to ensure safe transfers and ADL's at home.     Follow Up Recommendations  SNF   Equipment Recommendations  None recommended by OT           Precautions / Restrictions Precautions Precautions:  Fall Restrictions Weight Bearing Restrictions: No      Mobility Bed Mobility Overal bed mobility: Modified Independent             General bed mobility comments: supine to sit and sit to supine    Transfers Overall transfer level: Needs assistance   Transfers: Stand Pivot Transfers;Sit to/from Stand (ambulatory transfer) Sit to Stand: Min guard Stand pivot transfers: Min guard;Min assist       General transfer comment: Pt required min guard to min assist while standing after ambulating prior to sitting in w/c due to dizziness and losing balance.    Balance Overall balance assessment: Needs assistance Sitting-balance support: No upper extremity supported;Feet supported Sitting balance-Leahy Scale: Good Sitting balance - Comments: EOB   Standing balance support: During functional activity;No upper extremity supported Standing balance-Leahy Scale: Fair Standing balance comment: Fluctuated due to c/o dizziness and noted adduction accross midline of LE during ambulation.                           ADL either performed or assessed with clinical judgement   ADL Overall ADL's : Needs assistance/impaired                     Lower Body Dressing: Minimal assistance;Bed level (sitting at EOB) Lower Body Dressing Details (indicate cue type and reason): Min A to don socks mostly due to  socks being too tight. Able to doff socks Toilet Transfer: Min Manufacturing systems engineer Details (indicate cue type and reason): Simulated via transfer from w/c to bed. Min guard primarily due to dizziness and LOB.                 Vision Baseline Vision/History: No visual deficits                  Pertinent Vitals/Pain Pain Assessment: No/denies pain     Hand Dominance Right   Extremity/Trunk Assessment Upper Extremity Assessment Upper Extremity Assessment: Overall WFL for tasks assessed   Lower Extremity Assessment Lower Extremity Assessment: Defer  to PT evaluation   Cervical / Trunk Assessment Cervical / Trunk Assessment: Normal   Communication Communication Communication: No difficulties   Cognition Arousal/Alertness: Awake/alert Behavior During Therapy: WFL for tasks assessed/performed Overall Cognitive Status: Within Functional Limits for tasks assessed                                                      Home Living Family/patient expects to be discharged to:: Private residence Living Arrangements: Spouse/significant other Available Help at Discharge: Family;Available 24 hours/day (wife 24/7) Type of Home: House Home Access: Stairs to enter [employer] of Steps: 4 to 5 Entrance Stairs-Rails: Can reach both Home Layout: Two level Alternate Level Stairs-Number of Steps: 10 Alternate Level Stairs-Rails: Can reach both Bathroom Shower/Tub: Tub/shower unit;Walk-in shower   Bathroom Toilet: Handicapped height     Home Equipment: Environmental consultant - 2 wheels;Cane - single point;Grab bars - tub/shower;Shower seat - built in          Prior Functioning/Environment Level of Independence: Independent        Comments: Drove; no use of ambulatory equipment.        OT Problem List: Decreased activity tolerance;Impaired balance (sitting and/or standing)      OT Treatment/Interventions: Self-care/ADL training;Therapeutic exercise;Therapeutic activities;Balance training;Patient/family education    OT Goals(Current goals can be found in the care plan section) Acute Rehab OT Goals Patient Stated Goal: Return home OT Goal Formulation: With patient Time For Goal Achievement: 01/22/21 Potential to Achieve Goals: Good  OT Frequency: Min 2X/week               Co-evaluation PT/OT/SLP Co-Evaluation/Treatment: Yes Reason for Co-Treatment: For patient/therapist safety;To address functional/ADL transfers   OT goals addressed during session: ADL's and self-care;Strengthening/ROM      AM-PAC OT  "6 Clicks" Daily Activity     Outcome Measure Help from another person eating meals?: None Help from another person taking care of personal grooming?: None Help from another person toileting, which includes using toliet, bedpan, or urinal?: A Little Help from another person bathing (including washing, rinsing, drying)?: A Little Help from another person to put on and taking off regular upper body clothing?: None Help from another person to put on and taking off regular lower body clothing?: A Little 6 Click Score: 21   End of Session Nurse Communication: Other (comment) (Nurse notified of blood pressure drop.)  Activity Tolerance: Other (comment) (Limited by dizziness/orthostatic hypotension.) Patient left: in bed;with call bell/phone within reach  OT Visit Diagnosis: Unsteadiness on feet (R26.81);Other abnormalities of gait and mobility (R26.89);Dizziness and giddiness (R42)                Time: 3846-6599 OT Time  Calculation (min): 24 min Charges:  OT General Charges $OT Visit: 1 Visit OT Evaluation $OT Eval Low Complexity: 1 Low  SAMUEL CRICKENBERGER OT, MOT   Danie Chandler 01/08/2021, 9:54 AM

## 2021-01-08 NOTE — Progress Notes (Signed)
Was alerted by one of the CNAs that "patient and his wife are on the floor." Went into the room and Elease Hashimoto, RN, [place] CNA, and [place], CNA were in the room.  Elease Hashimoto was obtaining a set of vitals which are as follows:  BP 136/79 (97) HR 78 RR 20 spo2 99% T 97.73F oral  Per patient and his wife- patient tried to get up from the chair to get back into bed. He became unsteady and as he started to fall his wife reached her hand out to stop him. He subsequently went to the floor on his knees first and then sat down. His wife has reported an abrasion to her R elbow and small bruise to her R foot. Patient complains only of his knees being sore from going to the floor on them. Upon my last assessment of the patient around 1135, patient was in the bed with the bed alarm on. Dr. Laural Benes notified, no new orders at this time

## 2021-01-08 NOTE — Evaluation (Signed)
Physical Therapy Evaluation Patient Details Name: Shane Welch MRN: [id number] DOB: 03-04-1941 Today's Date: 01/08/2021   History of Present Illness  Shane Welch  is a 80 y.o. male, with history of type 2 diabetes mellitus, hyperlipidemia, essential hypertension, CAD, and more presents the ED with a chief complaint of fall.  Patient reports that around dinnertime he stood up to get something out of the pantry when he felt dizzy and fell backwards hitting his head on a cabinet.  He reports that he remembers the whole thing but he knows he felt very weak and thought he might blackout.  After hitting his head he has no changes in vision or hearing.  Prior to his fall he had no chest pain, palpitations.  He does report that prior to his fall he had dysarthria, and ataxia when walking to the pantry.  He thinks the whole thing may be lasted 5 minutes however his wife reported that it lasted longer.  Patient reports that this time he feels completely back to normal except for these tired.  Patient has never had symptoms like this before.  Patient has no other complaints at this time.    Clinical Impression  Patient demonstrates good return for bed mobility and transfers, but had to lean on nearby objects during ambulation due to c/o increasing dizziness while standing.  Patient returned to room in wheelchair for safety and orthostatics for sitting and standing as follows: sitting 160/83, standing 118/78 - RN notified.  Patient put back to bed and encouraged to use call light if needing to go to bathroom.  Patient will benefit from continued physical therapy in hospital and recommended venue below to increase strength, balance, endurance for safe ADLs and gait.    Follow Up Recommendations Home health PT;Supervision for mobility/OOB;Supervision - Intermittent    Equipment Recommendations  None recommended by PT    Recommendations for Other Services       Precautions / Restrictions  Precautions Precautions: Fall Restrictions Weight Bearing Restrictions: No      Mobility  Bed Mobility Overal bed mobility: Modified Independent             General bed mobility comments: supine to sit and sit to supine    Transfers Overall transfer level: Modified independent   Transfers: Stand Pivot Transfers;Sit to/from Stand (ambulatory transfer) Sit to Stand: Min guard Stand pivot transfers: Min guard;Min assist       General transfer comment: Pt required min guard to min assist while standing after ambulating prior to sitting in w/c due to dizziness and losing balance.  Ambulation/Gait Ambulation/Gait assistance: Supervision Gait Distance (Feet): 50 Feet Assistive device: None Gait Pattern/deviations: Decreased step length - left;Decreased stance time - right;Decreased stride length;Scissoring Gait velocity: decreased   General Gait Details: slow slightly labored cadence with tendency to lean on nearby objects for support when dizziness increased, limited mostly due to c/o dizziness and fatigue  Stairs            Wheelchair Mobility    Modified Rankin (Stroke Patients Only)       Balance Overall balance assessment: Needs assistance Sitting-balance support: Feet supported;No upper extremity supported Sitting balance-Leahy Scale: Good Sitting balance - Comments: EOB   Standing balance support: During functional activity;No upper extremity supported Standing balance-Leahy Scale: Fair Standing balance comment: once dizziness increased had to lean on near by objects for support, othewise fair/good without AD  Pertinent Vitals/Pain Pain Assessment: No/denies pain    Home Living Family/patient expects to be discharged to:: Private residence Living Arrangements: Spouse/significant other Available Help at Discharge: Family;Available 24 hours/day Type of Home: House Home Access: Stairs to enter Entrance  Stairs-Rails: Can reach both Entrance Stairs-Number of Steps: 4 to 5 Home Layout: Two level Home Equipment: Walker - 2 wheels;Cane - single point;Grab bars - tub/shower;Shower seat - built in      Prior Function Level of Independence: Independent         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, drives     Hand Dominance   Dominant Hand: Right    Extremity/Trunk Assessment   Upper Extremity Assessment Upper Extremity Assessment: Defer to OT evaluation    Lower Extremity Assessment Lower Extremity Assessment: Overall WFL for tasks assessed    Cervical / Trunk Assessment Cervical / Trunk Assessment: Normal  Communication   Communication: No difficulties  Cognition Arousal/Alertness: Awake/alert Behavior During Therapy: WFL for tasks assessed/performed Overall Cognitive Status: Within Functional Limits for tasks assessed                                        General Comments      Exercises     Assessment/Plan    PT Assessment Patient needs continued PT services  PT Problem List Decreased strength;Decreased activity tolerance;Decreased balance;Decreased mobility       PT Treatment Interventions DME instruction;Gait training;Stair training;Functional mobility training;Therapeutic activities;Therapeutic exercise;Balance training;Patient/family education    PT Goals (Current goals can be found in the Care Plan section)  Acute Rehab PT Goals Patient Stated Goal: return home with family to assist PT Goal Formulation: With patient Time For Goal Achievement: 01/11/21 Potential to Achieve Goals: Good    Frequency Min 4X/week   Barriers to discharge        Co-evaluation PT/OT/SLP Co-Evaluation/Treatment: Yes Reason for Co-Treatment: Complexity of the patient's impairments (multi-system involvement);For patient/therapist safety PT goals addressed during session: Mobility/safety with mobility;Balance OT goals addressed during session: ADL's and  self-care;Strengthening/ROM       AM-PAC PT "6 Clicks" Mobility  Outcome Measure Help needed turning from your back to your side while in a flat bed without using bedrails?: None Help needed moving from lying on your back to sitting on the side of a flat bed without using bedrails?: None Help needed moving to and from a bed to a chair (including a wheelchair)?: None Help needed standing up from a chair using your arms (e.g., wheelchair or bedside chair)?: None Help needed to walk in hospital room?: A Little Help needed climbing 3-5 steps with a railing? : A Lot 6 Click Score: 21    End of Session   Activity Tolerance: Patient tolerated treatment well;Patient limited by fatigue Patient left: in bed;with call bell/phone within reach Nurse Communication: Mobility status PT Visit Diagnosis: Unsteadiness on feet (R26.81);Other abnormalities of gait and mobility (R26.89);Muscle weakness (generalized) (M62.81)    Time: 3716-9678 PT Time Calculation (min) (ACUTE ONLY): 22 min   Charges:   PT Evaluation $PT Eval Moderate Complexity: 1 Mod PT Treatments $Therapeutic Activity: 8-22 mins        11:24 AM, 01/08/21 Ocie Bob, MPT Physical Therapist with [facility] [phone] office [phone] mobile phone

## 2021-01-08 NOTE — ED Notes (Signed)
Pt is alert and oriented x4. Pt given warm blankets and denies any needs at this time. Pt denies any pain. Will continue to monitor pt and vitals.

## 2021-01-08 NOTE — Plan of Care (Signed)
  Problem: Acute Rehab PT Goals(only PT should resolve) Goal: Pt Will Go Supine/Side To Sit Outcome: Progressing Flowsheets (Taken 01/08/2021 1126) Pt will go Supine/Side to Sit: Independently Goal: Patient Will Transfer Sit To/From Stand Outcome: Progressing Flowsheets (Taken 01/08/2021 1126) Patient will transfer sit to/from stand: Independently Goal: Pt Will Transfer Bed To Chair/Chair To Bed Outcome: Progressing Flowsheets (Taken 01/08/2021 1126) Pt will Transfer Bed to Chair/Chair to Bed:  Independently  with modified independence Goal: Pt Will Ambulate Outcome: Progressing Flowsheets (Taken 01/08/2021 1126) Pt will Ambulate:  > 125 feet  with modified independence  with supervision  with least restrictive assistive device   11:26 AM, 01/08/21 Ocie Bob, MPT Physical Therapist with [facility] [phone] office [phone] mobile phone

## 2021-01-08 NOTE — Progress Notes (Signed)
ASSUMPTION OF CARE NOTE   01/08/2021 10:45 AM  Shane Welch was seen and examined.  The H&P by the admitting provider, orders, imaging was reviewed.  Please see new orders.  Will continue to follow. Pt reports not back to full baseline.  Awaiting MRI studies and other stroke workup.  Impression/Plan  TIA/rule out CVA - Pt still not fully back to baseline - Follow up MRI/MRA brain to rule out acute CVA - Follow up 2D Echocardiogram and carotids - LDL suboptimally controlled, added atorvastatin daily.  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   Essential hypertension  - resumed home enalapril daily     Vitals:   01/08/21 0745 01/08/21 1015  BP: (!) 157/80 129/90  Pulse: 71 82  Resp: 18 18  Temp: (!) 97.3 F (36.3 C) 98 F (36.7 C)  SpO2: 99% 99%    Results for orders placed or performed during the hospital encounter of 01/07/21  Resp Panel by RT-PCR (Flu A&B, Covid) Nasopharyngeal Swab   Specimen: Nasopharyngeal Swab; Nasopharyngeal(NP) swabs in vial transport medium  Result Value Ref Range   SARS Coronavirus 2 by RT PCR NEGATIVE NEGATIVE   Influenza A by PCR NEGATIVE NEGATIVE   Influenza B by PCR NEGATIVE NEGATIVE  Ethanol  Result Value Ref Range   Alcohol, Ethyl (B) <10 <10 mg/dL  Protime-INR  Result Value Ref Range   Prothrombin Time 12.1 11.4 - 15.2 seconds   INR 0.9 0.8 - 1.2  APTT  Result Value Ref Range   aPTT 29 24 - 36 seconds  CBC  Result Value Ref Range   WBC 9.9 4.0 - 10.5 K/uL   RBC 5.02 4.22 - 5.81 MIL/uL   Hemoglobin 15.1 13.0 - 17.0 g/dL   HCT 31.5 40.0 - 86.7 %   MCV 93.2 80.0 - 100.0 fL   MCH 30.1 26.0 - 34.0 pg   MCHC 32.3 30.0 - 36.0 g/dL   RDW 61.9 50.9 - 32.6 %   Platelets 213 150 - 400 K/uL   nRBC 0.0 0.0 -  0.2 %  Differential  Result Value Ref Range   Neutrophils Relative % 48 %   Neutro Abs 4.8 1.7 - 7.7 K/uL   Lymphocytes Relative 39 %   Lymphs Abs 3.8 0.7 - 4.0 K/uL   Monocytes Relative 9 %   Monocytes Absolute 0.9 0.1 - 1.0 K/uL   Eosinophils Relative 3 %   Eosinophils Absolute 0.3 0.0 - 0.5 K/uL   Basophils Relative 1 %   Basophils Absolute 0.1 0.0 - 0.1 K/uL   Immature Granulocytes 0 %   Abs Immature Granulocytes 0.04 0.00 - 0.07 K/uL  Comprehensive metabolic panel  Result Value Ref Range   Sodium 138 135 - 145 mmol/L   Potassium 4.5 3.5 - 5.1 mmol/L   Chloride 104 98 - 111 mmol/L   CO2 24 22 - 32 mmol/L   Glucose, Bld 165 (H) 70 - 99 mg/dL   BUN 29 (H) 8 - 23 mg/dL   Creatinine, Ser 7.12 (H) 0.61 - 1.24 mg/dL   Calcium 8.9 8.9 - 45.8 mg/dL   Total Protein 7.5 6.5 - 8.1 g/dL   Albumin 4.0 3.5 - 5.0 g/dL   AST 20 15 - 41 U/L   ALT 14 0 - 44  U/L   Alkaline Phosphatase 48 38 - 126 U/L   Total Bilirubin 0.4 0.3 - 1.2 mg/dL   GFR, Estimated 41 (L) >60 mL/min   Anion gap 10 5 - 15  Urine rapid drug screen (hosp performed)  Result Value Ref Range   Opiates NONE DETECTED NONE DETECTED   Cocaine NONE DETECTED NONE DETECTED   Benzodiazepines NONE DETECTED NONE DETECTED   Amphetamines NONE DETECTED NONE DETECTED   Tetrahydrocannabinol NONE DETECTED NONE DETECTED   Barbiturates NONE DETECTED NONE DETECTED  Urinalysis, Routine w reflex microscopic Urine, Clean Catch  Result Value Ref Range   Color, Urine YELLOW YELLOW   APPearance CLEAR CLEAR   Specific Gravity, Urine 1.020 1.005 - 1.030   pH 5.0 5.0 - 8.0   Glucose, UA NEGATIVE NEGATIVE mg/dL   Hgb urine dipstick NEGATIVE NEGATIVE   Bilirubin Urine NEGATIVE NEGATIVE   Ketones, ur NEGATIVE NEGATIVE mg/dL   Protein, ur 30 (A) NEGATIVE mg/dL   Nitrite NEGATIVE NEGATIVE   Leukocytes,Ua NEGATIVE NEGATIVE   WBC, UA 0-5 0 - 5 WBC/hpf   Bacteria, UA NONE SEEN NONE SEEN   Mucus PRESENT   Lipid panel  Result Value Ref Range    Cholesterol 178 0 - 200 mg/dL   Triglycerides 884 (H) <150 mg/dL   HDL 32 (L) >16 mg/dL   Total CHOL/HDL Ratio 5.6 RATIO   VLDL 33 0 - 40 mg/dL   LDL Cholesterol 606 (H) 0 - 99 mg/dL  Glucose, capillary  Result Value Ref Range   Glucose-Capillary 147 (H) 70 - 99 mg/dL  CBG monitoring, ED  Result Value Ref Range   Glucose-Capillary 125 (H) 70 - 99 mg/dL     Shane Manuel, MD [facility]   01/07/2021  8:54 PM How to contact the [facility] Attending or Consulting provider 7A - 7P or covering provider during after hours 7P -7A, for this patient?  1. Check the care team in [facility] and look for a) attending/consulting [facility] provider listed and b) the [facility] team listed 2. Log into [email or web address] and use [place]'s universal password to access. If you do not have the password, please contact the hospital operator. 3. Locate the [facility] provider you are looking for under [facility] and page to a number that you can be directly reached. 4. If you still have difficulty reaching the provider, please page the [facility] (Director on Call) for the Hospitalists listed on amion for assistance.

## 2021-01-08 NOTE — Progress Notes (Signed)
Patient in room confused and hallucinating. Patient feels as if he hears his dog in the hallway.  Patient spoke to wife and confirmed dog was safe.  Patient continually attempts to get up without assistance, even though patient had previous fall earlier in day.

## 2021-01-08 NOTE — Progress Notes (Signed)
  Echocardiogram 2D Echocardiogram has been performed.  Shane Welch 01/08/2021, 11:57 AM

## 2021-01-08 NOTE — Consult Note (Signed)
[facility] TeleNeurology Consult Services  Stat Consult  Date of Service:   01/07/2021 23:42:19  Diagnosis:     .  R55 - Syncope (blackout, fainting, vasovagal attack)     .  G45.9 - Transient cerebral ischemic attack, unspecified  Impression: Shane Welch is a 80yo M with HTN, HLD, DM, and CAD. Arrived via EMS after an episode of ground level fall without trauma associated with lightheadedness, difficulty using both legs, and word finding difficulty. Suspect TIA, possible transient arrhythmia or pre-sycnope as well. Multiple vascular risk factors. Recommend inpatient stroke work up.  CT HEAD: Showed No Acute Hemorrhage or Acute Core Infarct Reviewed CLINICAL DATA: Fall at home   EXAM: CT HEAD WITHOUT CONTRAST   TECHNIQUE: Contiguous axial images were obtained from the base of the skull through the vertex without intravenous contrast.   COMPARISON: None.   FINDINGS: Brain: No evidence of acute territorial infarction, hemorrhage, hydrocephalus,extra-axial collection or mass lesion/mass effect. There is dilatation the ventricles and sulci consistent with age-related atrophy. Low-attenuation changes in the deep white matter consistent with small vessel ischemia.   Vascular: No hyperdense vessel or unexpected calcification.   Skull: The skull is intact. No fracture or focal lesion identified.   Sinuses/Orbits: The visualized paranasal sinuses and mastoid air cells are clear. The orbits and globes intact.   Other: None   IMPRESSION: No acute intracranial abnormality.   Findings consistent with age related atrophy and chronic small vessel ischemia     Electronically Signed By: Jonna Clark M.D. On: 01/07/2021 23:54  Our recommendations are outlined below.  Diagnostic Studies: Recommend MRI brain without contrast Routine MRA head without contrast and MRA Neck with contrast Transthoracic Echo with bubble study, if available  Laboratory  Studies: Recommend Lipid panel Hemoglobin A1c  Medication: Bolus with Clopidogrel 300 mg bolus x1 and initiate dual antiplatelet therapy with Aspirin 81 mg daily and Clopidogrel 75 mg daily  Nursing Recommendations: Telemetry, IV Fluids, avoid dextrose containing fluids, Maintain euglycemia Neuro checks q4 hrs x 24 hrs and then per shift Head of bed 30 degrees  DVT Prophylaxis: Choice of Primary Team  Disposition: Neurology will follow  Additional Recommendations: Obtain and document orthostatic vital signs laying/sitting/standing.  Syncope work up.  -------------------------------------- Labs: LDL 156, a1c 6.8  Metrics: [facility] Notification Time: 01/07/2021 23:40:32 Stamp Time: 01/07/2021 23:42:19 Callback Response Time: 01/07/2021 23:46:56   ----------------------------------------------------------------------------------------------------  Chief Complaint: ground level fall without trauma associated with lightheadedness, difficulty using both legs, and word finding difficulty  History of Present Illness: Patient is a 80 year old Male.  Shane Welch is a 80yo M with HTN, HLD, DM, and CAD. Arrived via EMS after an episode of ground level fall without trauma associated with lightheadedness, difficulty using both legs, and word finding difficulty. He is back to normal now. His wife [name] is at bedside. While getting something from the fridge his legs sudden gave out, fell forward into the pantry. No injury. Was having difficulty getting up, felt disoriented, did not know where he was. His legs weren't doing what he was telling them to do. Had some word finding difficulty. Associated with lightheadedness. This lasted about . He does not want to start a statin, he thinks it may have caused short term memory loss. He does not exercise.   Past Medical History:     . Hypertension     . Diabetes Mellitus     . Hyperlipidemia     . Coronary Artery Disease     .  There is NO history of Atrial Fibrillation     . There is NO history of Stroke     . soch: no tobacco/drug/etoh use  Anticoagulant use:  No  Antiplatelet use: Yes aspirin   Examination: BP(168/99), Pulse(84), Blood Glucose(125) 1A: Level of Consciousness - Alert; keenly responsive + 0 1B: Ask Month and Age - Both Questions Right + 0 1C: Blink Eyes & Squeeze Hands - Performs Both Tasks + 0 2: Test Horizontal Extraocular Movements - Normal + 0 3: Test Visual Fields - No Visual Loss + 0 4: Test Facial Palsy (Use Grimace if Obtunded) - Normal symmetry + 0 5A: Test Left Arm Motor Drift - No Drift for 10 Seconds + 0 5B: Test Right Arm Motor Drift - No Drift for 10 Seconds + 0 6A: Test Left Leg Motor Drift - No Drift for 5 Seconds + 0 6B: Test Right Leg Motor Drift - No Drift for 5 Seconds + 0 7: Test Limb Ataxia (FNF/Heel-Shin) - No Ataxia + 0 8: Test Sensation - Normal; No sensory loss + 0 9: Test Language/Aphasia - Normal; No aphasia + 0 10: Test Dysarthria - Normal + 0 11: Test Extinction/Inattention - No abnormality + 0  NIHSS Score: 0    Patient / Family was informed the Neurology Consult would occur via TeleHealth consult by way of interactive audio and video telecommunications and consented to receiving care in this manner.  Patient is being evaluated for possible acute neurologic impairment and high probability of imminent or life - threatening deterioration.I spent total of 20 minutes providing care to this patient, including time for face to face visit via telemedicine, review of medical records, imaging studies and discussion of findings with providers, the patient and / or family.   Dr Marijo File   [facility] [phone]  Case [id number]

## 2021-01-09 DIAGNOSIS — Z66 Do not resuscitate: Secondary | ICD-10-CM | POA: Diagnosis present

## 2021-01-09 DIAGNOSIS — I639 Cerebral infarction, unspecified: Secondary | ICD-10-CM | POA: Diagnosis not present

## 2021-01-09 DIAGNOSIS — Z955 Presence of coronary angioplasty implant and graft: Secondary | ICD-10-CM | POA: Diagnosis not present

## 2021-01-09 DIAGNOSIS — Z20822 Contact with and (suspected) exposure to covid-19: Secondary | ICD-10-CM | POA: Diagnosis present

## 2021-01-09 DIAGNOSIS — Z7984 Long term (current) use of oral hypoglycemic drugs: Secondary | ICD-10-CM | POA: Diagnosis not present

## 2021-01-09 DIAGNOSIS — Z87891 Personal history of nicotine dependence: Secondary | ICD-10-CM | POA: Diagnosis not present

## 2021-01-09 DIAGNOSIS — E785 Hyperlipidemia, unspecified: Secondary | ICD-10-CM | POA: Diagnosis present

## 2021-01-09 DIAGNOSIS — I63531 Cerebral infarction due to unspecified occlusion or stenosis of right posterior cerebral artery: Secondary | ICD-10-CM | POA: Diagnosis present

## 2021-01-09 DIAGNOSIS — R29700 NIHSS score 0: Secondary | ICD-10-CM | POA: Diagnosis present

## 2021-01-09 DIAGNOSIS — I10 Essential (primary) hypertension: Secondary | ICD-10-CM | POA: Diagnosis present

## 2021-01-09 DIAGNOSIS — I161 Hypertensive emergency: Secondary | ICD-10-CM | POA: Diagnosis present

## 2021-01-09 DIAGNOSIS — E1142 Type 2 diabetes mellitus with diabetic polyneuropathy: Secondary | ICD-10-CM | POA: Diagnosis present

## 2021-01-09 DIAGNOSIS — W1830XA Fall on same level, unspecified, initial encounter: Secondary | ICD-10-CM | POA: Diagnosis not present

## 2021-01-09 DIAGNOSIS — I2510 Atherosclerotic heart disease of native coronary artery without angina pectoris: Secondary | ICD-10-CM | POA: Diagnosis present

## 2021-01-09 DIAGNOSIS — Z781 Physical restraint status: Secondary | ICD-10-CM | POA: Diagnosis not present

## 2021-01-09 DIAGNOSIS — Z7982 Long term (current) use of aspirin: Secondary | ICD-10-CM | POA: Diagnosis not present

## 2021-01-09 DIAGNOSIS — Y92230 Patient room in hospital as the place of occurrence of the external cause: Secondary | ICD-10-CM | POA: Diagnosis not present

## 2021-01-09 DIAGNOSIS — R471 Dysarthria and anarthria: Secondary | ICD-10-CM | POA: Diagnosis present

## 2021-01-09 DIAGNOSIS — S80212A Abrasion, left knee, initial encounter: Secondary | ICD-10-CM | POA: Diagnosis not present

## 2021-01-09 DIAGNOSIS — I951 Orthostatic hypotension: Secondary | ICD-10-CM | POA: Diagnosis present

## 2021-01-09 DIAGNOSIS — G459 Transient cerebral ischemic attack, unspecified: Secondary | ICD-10-CM | POA: Diagnosis present

## 2021-01-09 DIAGNOSIS — R296 Repeated falls: Secondary | ICD-10-CM | POA: Diagnosis present

## 2021-01-09 DIAGNOSIS — F05 Delirium due to known physiological condition: Secondary | ICD-10-CM | POA: Diagnosis not present

## 2021-01-09 DIAGNOSIS — G901 Familial dysautonomia [Riley-Day]: Secondary | ICD-10-CM | POA: Diagnosis present

## 2021-01-09 DIAGNOSIS — R270 Ataxia, unspecified: Secondary | ICD-10-CM | POA: Diagnosis present

## 2021-01-09 DIAGNOSIS — G9349 Other encephalopathy: Secondary | ICD-10-CM | POA: Diagnosis present

## 2021-01-09 DIAGNOSIS — I252 Old myocardial infarction: Secondary | ICD-10-CM | POA: Diagnosis not present

## 2021-01-09 DIAGNOSIS — Z79899 Other long term (current) drug therapy: Secondary | ICD-10-CM | POA: Diagnosis not present

## 2021-01-09 LAB — GLUCOSE, CAPILLARY
Glucose-Capillary: 143 mg/dL — ABNORMAL HIGH (ref 70–99)
Glucose-Capillary: 190 mg/dL — ABNORMAL HIGH (ref 70–99)
Glucose-Capillary: 126 mg/dL — ABNORMAL HIGH (ref 70–99)
Glucose-Capillary: 187 mg/dL — ABNORMAL HIGH (ref 70–99)

## 2021-01-09 MED ORDER — LORAZEPAM 2 MG/ML IJ SOLN
1.0000 mg | Freq: Once | INTRAMUSCULAR | Status: AC
  Administered 2021-01-09: 1 mg via INTRAVENOUS
  Filled 2021-01-09: qty 1

## 2021-01-09 MED ORDER — HALOPERIDOL LACTATE 5 MG/ML IJ SOLN
2.0000 mg | Freq: Once | INTRAMUSCULAR | Status: AC
  Administered 2021-01-09: 2 mg via INTRAMUSCULAR
  Filled 2021-01-09: qty 1

## 2021-01-09 NOTE — Progress Notes (Addendum)
8:54 PM RN called due to patient being confused and agitated.  He became physical with NT.  It was noted that patient received Ativan 1 milligram IV yesterday, this was ordered.  9:35 PM RN called due to patient still being aggressive and combative with security staff.  Two-point restraint and IV Haldol 2 mg x 1 ordered.  ECG will be checked to monitor QT elongation.  We shall continue to monitor patient. Patient was calm after the Haldol, but he became agitated after about 4 hrs. A repeat Haldol 2 mg was given.

## 2021-01-09 NOTE — Care Management Obs Status (Signed)
MEDICARE OBSERVATION STATUS NOTIFICATION   Patient Details  Name: Shane Welch MRN: [id number] Date of Birth: 09/12/1941   Medicare Observation Status Notification Given:  Yes    Corey Harold 01/09/2021, 11:06 AM

## 2021-01-09 NOTE — Evaluation (Signed)
Speech Language Pathology Evaluation Patient Details Name: Shane Welch MRN: [id number] DOB: 11/19/1940 Today's Date: 01/09/2021 Time: 3474-2595 SLP Time Calculation (min) (ACUTE ONLY): 36.1 min  Problem List:  Patient Active Problem List   Diagnosis Date Noted  . Acute CVA (cerebrovascular accident)/Rt occipital Stroke  01/09/2021  . Hypertensive emergency 01/08/2021  . TIA (transient ischemic attack) 01/08/2021  . 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 Essential hypertension 09/03/2017  . 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 Abnormal echocardiogram 03/11/2016  . CAD (coronary artery disease) 12/10/2015  . Hyperlipidemia 12/10/2015  . Diabetes (HCC) 12/10/2015  . Acute MI anterior wall first episode care [facility])    Past Medical History:  Past Medical History:  Diagnosis Date  . Abnormal echocardiogram 03/11/2016  . Acute MI anterior wall first episode care [facility])   . CAD (coronary artery disease) 12/10/2015  . CAD (coronary artery disease), native coronary artery   . Diabetes (HCC) 12/10/2015  . Essential hypertension 09/03/2017  . Hyperlipidemia   . 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 Type II diabetes mellitus (HCC)    a. Dx ~ 2010.   Past Surgical History:  Past Surgical History:  Procedure Laterality Date  . CARDIAC CATHETERIZATION N/A 12/08/2015   Procedure: Left Heart Cath and Coronary Angiography;  Surgeon: Corky Crafts, MD;  Location: [facility] IN[facility]AS[facility] CV LAB;  Service: Cardiovascular;  Laterality: N/A;  . CARDIAC CATHETERIZATION  12/08/2015   Procedure: Coronary Stent Intervention;  Surgeon: Corky Crafts, MD;  Location: [facility] INVASIVE CV LAB;  Service: Cardiovascular;;  . CERVICAL SPINE SURGERY     a. ~ 2002.  . Right Knee Surgery     a. 08/2015.   HPI:  80 y.o. 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 Speech and Language evaluation requested   Assessment / Plan / Recommendation Clinical Impression  Speech Language Evaluation completed while Pt was reclined in the bed; Pt presents with fluctuating cognition with periods of time of complete clarity and episodes of confusion and agitation. Pt initially presented with mild/mod cognitive impairment as evidenced from the [facility] with a score of 14/30. Pt and wife report changes and difficulty with short term memory over the last year, the presentation on this assessment may be baseline. He demonstrated difficulty with short term memory, thought organization and problem solving. Further note spatial difficulty with the clock drawing questionably indicating some left neglect. Pt spoke in very coherent conversation with SLP reporting his occupational history and other biographical information without difficulty.   He was interchanging and laughing with SLP until after the assessment was completed and note Pt had a significant change in cognition trying to get out of bed reporting to his wife "I want to get off this sofa and go to the car" all of a sudden he was unaware of where he was or why he was here; he became agitated at this time. SLP asked other questions regarding Pt's history which temporarily distracted him but he perseverated on getting up. SLP reported sudden change to RN who reported to bedside. Pt will require 24 hr care at this time secondary to fluctuating orientation and awareness and poor safety/judgement during his episodes of confusion. Recommend more in depth cognitive testing at next level of care (SNF recommended) and in the case of d/c home after SNF  recommend further OP ST to facilitate independence, maintain optimal safety and functioning, and to provide compensatory strategies for caregiver and Patient. ST will continue to follow acutely    SLP  Assessment  SLP Visit Diagnosis: Cognitive communication deficit (R41.841)    Follow Up Recommendations       Frequency and Duration min 1 x/week  1 week      SLP Evaluation Cognition  Overall Cognitive Status: Impaired/Different from baseline Arousal/Alertness: Awake/alert Orientation Level: Oriented X4 Attention: Focused Focused Attention: Appears intact Memory: Impaired Awareness: Impaired Problem Solving: Impaired Executive Function: Sequencing Safety/Judgment: Impaired       Comprehension  Auditory Comprehension Overall Auditory Comprehension: Appears within functional limits for tasks assessed Yes/No Questions: Within Functional Limits Commands: Within Functional Limits    Expression Expression Primary Mode of Expression: Verbal Verbal Expression Overall Verbal Expression: Appears within functional limits for tasks assessed Written Expression Dominant Hand: Right   Oral / Motor  Oral Motor/Sensory Function Overall Oral Motor/Sensory Function: Within functional limits Motor Speech Overall Motor Speech: Appears within functional limits for tasks assessed     Novak Stgermaine H. Romie Levee, CCC-SLP Speech Language Pathologist         Georgetta Haber 01/09/2021, 4:34 PM

## 2021-01-09 NOTE — NC FL2 (Signed)
[place] MEDICAID FL2 LEVEL OF CARE SCREENING TOOL     IDENTIFICATION  Patient Name: Shane Welch Birthdate: 06-28-1941 Sex: male Admission Date (Current Location): 01/07/2021  [facility] and [place] Number:  [employer] and Address:  [facility],  [address] [place] [address]      Provider Number: [id number]  Attending Physician Name and Address:  Shon Hale, MD  Relative Name and Phone Number:  Desta,Linda (Spouse)   [phone]    Current Level of Care: Other (Comment) (obs) Recommended Level of Care: Skilled Nursing Facility Prior Approval Number:    Date Approved/Denied:   PASRR Number: [id number]  Discharge Plan: SNF    Current Diagnoses: Patient Active Problem List   Diagnosis Date Noted  . Acute CVA (cerebrovascular accident)/Rt occipital Stroke  01/09/2021  . Hypertensive emergency 01/08/2021  . TIA (transient ischemic attack) 01/08/2021  . 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 Essential hypertension 09/03/2017  . 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 Abnormal echocardiogram 03/11/2016  . CAD (coronary artery disease) 12/10/2015  . Hyperlipidemia 12/10/2015  . Diabetes (HCC) 12/10/2015  . Acute MI anterior wall first episode care (HCC)     Orientation RESPIRATION BLADDER Height & Weight     Self,Situation,Place  Normal Continent Weight: 218 lb 7.6 oz (99.1 kg) Height:  5\' 11"  (180.3 cm)  BEHAVIORAL SYMPTOMS/MOOD NEUROLOGICAL BOWEL NUTRITION STATUS      Continent Diet (heart healthy)  AMBULATORY STATUS COMMUNICATION OF NEEDS Skin   Extensive Assist Verbally Normal                       Personal Care Assistance Level of Assistance  Bathing,Feeding,Dressing Bathing Assistance: Limited assistance Feeding assistance: Independent Dressing Assistance: Limited assistance     Functional Limitations Info  Sight,Hearing,Speech Sight Info: Adequate Hearing Info: Adequate Speech Info: Adequate     SPECIAL CARE FACTORS FREQUENCY  PT (By licensed PT)     PT Frequency: 5x/week              Contractures Contractures Info: Not present    Additional Factors Info  Code Status,Allergies Code Status Info: DNR Allergies Info: Sulfa Antibiotics           Current Medications (01/09/2021):  This is the current hospital active medication list Current Facility-Administered Medications  Medication Dose Route Frequency Provider Last Rate Last Admin  . acetaminophen (TYLENOL) tablet 650 mg  650 mg Oral Q4H PRN Zierle-Ghosh, Asia B, DO       Or  . acetaminophen (TYLENOL) 160 MG/5ML solution 650 mg  650 mg Per Tube Q4H PRN Zierle-Ghosh, Asia B, DO       Or  . acetaminophen (TYLENOL) suppository 650 mg  650 mg Rectal Q4H PRN Zierle-Ghosh, Asia B, DO      . aspirin EC tablet 81 mg  81 mg Oral Daily Zierle-Ghosh, Asia B, DO   81 mg at 01/09/21 1117  . atorvastatin (LIPITOR) tablet 40 mg  40 mg Oral QPM Johnson, Clanford L, MD   40 mg at 01/09/21 1720  . clopidogrel (PLAVIX) tablet 75 mg  75 mg Oral Daily Zierle-Ghosh, Asia B, DO   75 mg at 01/09/21 1117  . enalapril (VASOTEC) tablet 2.5 mg  2.5 mg Oral Daily Zierle-Ghosh, Asia B, DO   2.5 mg at 01/09/21 1117  . heparin injection 5,000 Units  5,000 Units Subcutaneous Q8H Zierle-Ghosh, Asia B, DO   5,000 Units at 01/09/21 1653  . insulin aspart (  novoLOG) injection 0-5 Units  0-5 Units Subcutaneous QHS Zierle-Ghosh, Asia B, DO      . insulin aspart (novoLOG) injection 0-9 Units  0-9 Units Subcutaneous TID WC Zierle-Ghosh, Asia B, DO   1 Units at 01/09/21 1654  . midodrine (PROAMATINE) tablet 5 mg  5 mg Oral TID WC Beryle Beams, MD   5 mg at 01/09/21 1719  . senna-docusate (Senokot-S) tablet 1 tablet  1 tablet Oral QHS PRN Zierle-Ghosh, Asia B, DO         Discharge Medications: Please see discharge summary for a list of discharge medications.  Relevant Imaging Results:  Relevant Lab Results:   Additional Information SSN [id number], Juleen China, LCSW

## 2021-01-09 NOTE — Progress Notes (Signed)
Patient Demographics:    Shane Welch, is a 80 y.o. male, DOB - 01-22-1941, JXB:[id number]  Admit date - 01/07/2021   Admitting Physician Lilyan Gilford, DO  Outpatient Primary MD for the patient is Kaleen Mask, MD  LOS - 0  Chief Complaint  Patient presents with  . Fall        Subjective:    Shane Welch today has no fevers, no emesis,  No chest pain,  --wife and son at bedside, patient remains very orthostatic and very unsteady -Patient had significant confusional episodes overnight  --More coherent at this time -Wife and son at bedside --patient fell down in his hospital room on 01/08/2021--his wife was trying to help him and she fell with him too--patient has abrasion over the left knee otherwise no injuries  Assessment  & Plan :    Principal Problem:   Acute CVA (cerebrovascular accident)/Rt occipital Stroke  Active Problems:   Essential hypertension   CAD (coronary artery disease)   Diabetes [facility])   Hypertensive emergency  Brief Summary:- 80 y.o. male, with history of type 2 diabetes mellitus, hyperlipidemia, essential hypertension, CAD admitted with dizziness, ataxia and dysarthria and found to have right occipital stroke x2 with high-grade stenosis of P2 PCA and with significant orthostatic hypotension/dysfunction  A/p 1) acute right occipital stroke--- MRI brain/MRA head consistent with Two small acute right occipital lobe cortical infarcts. High-grade stenosis of the proximal right P2 PCA (near fetal origin right PCA). 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 presenting with significant orthostatics -Midodrine tablets and thigh-high TED stockings was advised -Patient with fall, at risk for recurrent falls  3)HTN--patient presented with hypertensive emergency, okay to allow for permissive hypertension -Currently on enalapril 2.5 mg daily  4)DM2-A1c 6.8, reflecting excellent DM control PTA Use Novolog/Humalog Sliding scale insulin with Accu-Cheks/Fingersticks as ordered   5) social/ethics--- plan of care discussed with patient, his wife and the son at bedside --Patient is a DNR  6)Disposition/unsteady gait/ambulatory dysfunction/orthostatic dizziness-----patient fell down in his hospital room on 01/08/2021--his wife was trying to help him and she fell with him too--patient has abrasion over the left knee otherwise no injuries -Recurrent falls---PTA pt lived alone and did very poorly, patient has significant limitations with mobility related ADLs- this patient needs to continue to be monitored in the hospital until a SNF bed is obtained as she is not safe to go home with her current physcical limitations   Disposition/Need for in-Hospital Stay- patient unable to be discharged at this time due to --acute stroke with unsteady gait and falls, unsafe discharge home awaiting SNF placement for rehab ---patient fell down in his hospital room on 01/08/2021--his wife was trying to help him and she fell with him too--patient has abrasion over the left knee otherwise no injuries  Disposition: The patient is from: Home  Anticipated d/c is to: SNF              Anticipated d/c date is: 1 day              Patient currently is medically stable to d/c. Barriers: Not Clinically Stable- -awaiting SNF bed  Code Status :  -  Code Status: DNR   Family Communication:    (patient is alert, awake and coherent)  -Discussed with his wife and  son at bedside  Consults  :  Neuro  DVT Prophylaxis  :   - SCDs \\Place  TED hose Start: 01/09/21 1100 heparin injection 5,000 Units Start: 01/08/21 0600 SCD's Start: 01/08/21 0252    Lab Results  Component Value Date   PLT 213 01/07/2021    Inpatient Medications  Scheduled Meds: . aspirin EC  81 mg Oral Daily  . atorvastatin  40 mg Oral QPM  . clopidogrel  75 mg Oral Daily  . enalapril  2.5 mg Oral Daily  . heparin  5,000 Units Subcutaneous Q8H  . insulin aspart  0-5 Units Subcutaneous QHS  . insulin aspart  0-9 Units Subcutaneous TID WC  . midodrine  5 mg Oral TID WC   Continuous Infusions: PRN Meds:.acetaminophen **OR** acetaminophen (TYLENOL) oral liquid 160 mg/5 mL **OR** acetaminophen, senna-docusate    Anti-infectives (From admission, onward)   None        Objective:   Vitals:   01/08/21 2035 01/09/21 0000 01/09/21 0434 01/09/21 0800  BP: (!) 142/27 132/73 (!) 150/78 (!) 163/94  Pulse: (!) 48 76 62 80  Resp:   19 16  Temp:  98 F (36.7 C) 98.5 F (36.9 C) 98.3 F (36.8 C)  TempSrc:  Oral  Oral  SpO2: 99%  95% 99%  Weight:      Height:        Wt Readings from Last 3 Encounters:  01/08/21 99.1 kg  12/24/20 100.8 kg  11/22/19 100 kg     Intake/Output Summary (Last 24 hours) at 01/09/2021 1120 Last data filed at 01/08/2021 1300 Gross per 24 hour  Intake 240 ml  Output --  Net 240 ml    Physical Exam  Gen:- Awake Alert,  In no apparent distress  HEENT:- [place].AT, No sclera icterus Neck-Supple Neck,No JVD,.  Lungs-  CTAB , fair symmetrical air movement CV- S1, S2 normal, regular  Abd-  +ve B.Sounds, Abd Soft, No tenderness,    Extremity/Skin:- No  edema, pedal pulses present  Psych-affect is appropriate, oriented x3 Neuro-very unsteady gait, ataxia and dizziness noted , no additional new focal deficits, no tremors   Data Review:   Micro Results Recent Results (from the past 240 hour(s))  Resp Panel by RT-PCR (Flu A&B, Covid)  Nasopharyngeal Swab     Status: None   Collection Time: 01/08/21  2:00 AM   Specimen: Nasopharyngeal Swab; Nasopharyngeal(NP) swabs in vial transport medium  Result Value Ref Range Status   SARS Coronavirus 2 by RT PCR NEGATIVE NEGATIVE Final    Comment: (NOTE) SARS-CoV-2 target nucleic acids are NOT DETECTED.  The SARS-CoV-2 RNA is generally detectable in upper respiratory specimens during the acute phase of infection. The lowest concentration of SARS-CoV-2 viral copies this assay can detect is 138 copies/mL. A negative result does not preclude SARS-Cov-2 infection and should not be used as the sole basis for treatment or other patient management decisions. A negative result may occur with  improper specimen collection/handling, submission of specimen other than nasopharyngeal swab, presence of  viral mutation(s) within the areas targeted by this assay, and inadequate number of viral copies(<138 copies/mL). A negative result must be combined with clinical observations, patient history, and epidemiological information. The expected result is Negative.  Fact Sheet for Patients:  BloggerCourse.com  Fact Sheet for Healthcare Providers:  SeriousBroker.it  This test is no t yet approved or cleared by the [place] FDA and  has been authorized for detection and/or diagnosis of SARS-CoV-2 by FDA under an Emergency Use Authorization (EUA). This EUA will remain  in effect (meaning this test can be used) for the duration of the COVID-19 declaration under Section 564(b)(1) of the Act, 21 U.S.C.section 360bbb-3(b)(1), unless the authorization is terminated  or revoked sooner.       Influenza A by PCR NEGATIVE NEGATIVE Final   Influenza B by PCR NEGATIVE NEGATIVE Final    Comment: (NOTE) The Xpert Xpress SARS-CoV-2/FLU/RSV plus assay is intended as an aid in the diagnosis of influenza from Nasopharyngeal swab specimens and should not be  used as a sole basis for treatment. Nasal washings and aspirates are unacceptable for Xpert Xpress SARS-CoV-2/FLU/RSV testing.  Fact Sheet for Patients: BloggerCourse.com  Fact Sheet for Healthcare Providers: SeriousBroker.it  This test is not yet approved or cleared by the [place] FDA and has been authorized for detection and/or diagnosis of SARS-CoV-2 by FDA under an Emergency Use Authorization (EUA). This EUA will remain in effect (meaning this test can be used) for the duration of the COVID-19 declaration under Section 564(b)(1) of the Act, 21 U.S.C. section 360bbb-3(b)(1), unless the authorization is terminated or revoked.  Performed at [facility], [address], [place], [place] [address]     Radiology Reports CT HEAD WO CONTRAST  Result Date: 01/07/2021 CLINICAL DATA:  Fall at home EXAM: CT HEAD WITHOUT CONTRAST TECHNIQUE: Contiguous axial images were obtained from the base of the skull through the vertex without intravenous contrast. COMPARISON:  None. FINDINGS: Brain: No evidence of acute territorial infarction, hemorrhage, hydrocephalus,extra-axial collection or mass lesion/mass effect. There is dilatation the ventricles and sulci consistent with age-related atrophy. Low-attenuation changes in the deep white matter consistent with small vessel ischemia. Vascular: No hyperdense vessel or unexpected calcification. Skull: The skull is intact. No fracture or focal lesion identified. Sinuses/Orbits: The visualized paranasal sinuses and mastoid air cells are clear. The orbits and globes intact. Other: None IMPRESSION: No acute intracranial abnormality. Findings consistent with age related atrophy and chronic small vessel ischemia Electronically Signed   By: Jonna Clark M.D.   On: 01/07/2021 23:54   MR ANGIO HEAD WO CONTRAST  Result Date: 01/08/2021 CLINICAL DATA:  Weakness, fall EXAM: MRI HEAD WITHOUT CONTRAST MRA HEAD WITHOUT  CONTRAST MRA NECK WITHOUT AND WITH CONTRAST TECHNIQUE: Multiplanar, multiecho pulse sequences of the brain and surrounding structures were obtained without intravenous contrast. Angiographic images of the Circle of Willis were obtained using MRA technique without intravenous contrast. Angiographic images of the neck were obtained using MRA technique without and with intravenous contrast. Carotid stenosis measurements (when applicable) are obtained utilizing NASCET criteria, using the distal internal carotid diameter as the denominator. CONTRAST:  10mL GADAVIST GADOBUTROL 1 MMOL/ML IV SOLN COMPARISON:  None. FINDINGS: MRI HEAD Brain: There are two small foci mildly reduced cortical diffusion in the right occipital lobe. No evidence of intracranial hemorrhage. Prominence of the ventricles and sulci reflects generalized parenchymal volume loss. 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 Minimal patchy T2 hyperintensity in the supratentorial white matter is nonspecific but may reflect minor chronic microvascular ischemic changes. Ovoid as motion in the fall there is no intracranial mass, mass effect, or edema. There is no hydrocephalus or extra-axial fluid collection. Vascular: Major vessel flow voids at the skull base are preserved. Skull and upper cervical spine: Normal marrow signal is preserved. Sinuses/Orbits: Paranasal sinuses are aerated. Orbits are unremarkable. Other: Sella is unremarkable.  Mastoid air cells are clear. MRA HEAD Intracranial internal carotid arteries are patent. Middle and anterior cerebral arteries are patent. Intracranial vertebral arteries are patent. Basilar artery is patent. Basilar artery becomes diminutive after superior cerebellar artery origins terminating as a small right P1 PCA. Near fetal origin of the right PCA and fetal origin of the left PCA. There is  high-grade stenosis of the proximal P2 PCA with no apparent distal flow related enhancement. On postcontrast MRA neck, flow is likely present with additional P2 stenoses. There is no aneurysm. MRA NECK Common, internal, and external carotid arteries are patent. Mild plaque at the right greater than left ICA origins without stenosis. Vertebral artery origins are poorly evaluated. Otherwise patent and codominant without stenosis. IMPRESSION: Two small acute right occipital lobe cortical infarcts. High-grade stenosis of the proximal right P2 PCA (near fetal origin right PCA). Mild plaque at the ICA origins.  No stenosis. Electronically Signed   By: Guadlupe Spanish M.D.   On: 01/08/2021 14:47   MR ANGIO NECK W WO CONTRAST  Result Date: 01/08/2021 CLINICAL DATA:  Weakness, fall EXAM: MRI HEAD WITHOUT CONTRAST MRA HEAD WITHOUT CONTRAST MRA NECK WITHOUT AND WITH CONTRAST TECHNIQUE: Multiplanar, multiecho pulse sequences of the brain and surrounding structures were obtained without intravenous contrast. Angiographic images of the Circle of Willis were obtained using MRA technique without intravenous contrast. Angiographic images of the neck were obtained using MRA technique without and with intravenous contrast. Carotid stenosis measurements (when applicable) are obtained utilizing NASCET criteria, using the distal internal carotid diameter as the denominator. CONTRAST:  10mL GADAVIST GADOBUTROL 1 MMOL/ML IV SOLN COMPARISON:  None. FINDINGS: MRI HEAD Brain: There are two small foci mildly reduced cortical diffusion in the right occipital lobe. No evidence of intracranial hemorrhage. Prominence of the ventricles and sulci reflects generalized parenchymal volume loss. 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 Minimal patchy T2 hyperintensity in the supratentorial white matter is nonspecific but  may reflect minor chronic microvascular ischemic changes. Ovoid as motion in the fall there is no intracranial mass, mass effect, or edema. There is no hydrocephalus or extra-axial fluid collection. Vascular: Major vessel flow voids at the skull base are preserved. Skull and upper cervical spine: Normal marrow signal is preserved. Sinuses/Orbits: Paranasal sinuses are aerated. Orbits are unremarkable. Other: Sella is unremarkable.  Mastoid air cells are clear. MRA HEAD Intracranial internal carotid arteries are patent. Middle and anterior cerebral arteries are patent. Intracranial vertebral arteries are patent. Basilar artery is patent. Basilar artery becomes diminutive after superior cerebellar artery origins terminating as a small right P1 PCA. Near fetal origin of the right PCA and fetal origin of the left PCA. There is high-grade stenosis of the proximal P2 PCA with no apparent distal flow related enhancement. On postcontrast MRA neck, flow is likely present with additional P2 stenoses. There is no aneurysm. MRA NECK Common, internal, and external carotid arteries are patent. Mild plaque at the right greater  than left ICA origins without stenosis. Vertebral artery origins are poorly evaluated. Otherwise patent and codominant without stenosis. IMPRESSION: Two small acute right occipital lobe cortical infarcts. High-grade stenosis of the proximal right P2 PCA (near fetal origin right PCA). Mild plaque at the ICA origins.  No stenosis. Electronically Signed   By: Guadlupe Spanish M.D.   On: 01/08/2021 14:47   MR BRAIN WO CONTRAST  Result Date: 01/08/2021 CLINICAL DATA:  Weakness, fall EXAM: MRI HEAD WITHOUT CONTRAST MRA HEAD WITHOUT CONTRAST MRA NECK WITHOUT AND WITH CONTRAST TECHNIQUE: Multiplanar, multiecho pulse sequences of the brain and surrounding structures were obtained without intravenous contrast. Angiographic images of the Circle of Willis were obtained using MRA technique without intravenous contrast.  Angiographic images of the neck were obtained using MRA technique without and with intravenous contrast. Carotid stenosis measurements (when applicable) are obtained utilizing NASCET criteria, using the distal internal carotid diameter as the denominator. CONTRAST:  41mL GADAVIST GADOBUTROL 1 MMOL/ML IV SOLN COMPARISON:  None. FINDINGS: MRI HEAD Brain: There are two small foci mildly reduced cortical diffusion in the right occipital lobe. No evidence of intracranial hemorrhage. Prominence of the ventricles and sulci reflects generalized parenchymal volume loss. 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 Minimal patchy T2 hyperintensity in the supratentorial white matter is nonspecific but may reflect minor chronic microvascular ischemic changes. Ovoid as motion in the fall there is no intracranial mass, mass effect, or edema. There is no hydrocephalus or extra-axial fluid collection. Vascular: Major vessel flow voids at the skull base are preserved. Skull and upper cervical spine: Normal marrow signal is preserved. Sinuses/Orbits: Paranasal sinuses are aerated. Orbits are unremarkable. Other: Sella is unremarkable.  Mastoid air cells are clear. MRA HEAD Intracranial internal carotid arteries are patent. Middle and anterior cerebral arteries are patent. Intracranial vertebral arteries are patent. Basilar artery is patent. Basilar artery becomes diminutive after superior cerebellar artery origins terminating as a small right P1 PCA. Near fetal origin of the right PCA and fetal origin of the left PCA. There is high-grade stenosis of the proximal P2 PCA with no apparent distal flow related enhancement. On postcontrast MRA neck, flow is likely present with additional P2 stenoses. There is no aneurysm. MRA NECK Common, internal, and external carotid arteries are patent. Mild plaque at the right  greater than left ICA origins without stenosis. Vertebral artery origins are poorly evaluated. Otherwise patent and codominant without stenosis. IMPRESSION: Two small acute right occipital lobe cortical infarcts. High-grade stenosis of the proximal right P2 PCA (near fetal origin right PCA). Mild plaque at the ICA origins.  No stenosis. Electronically Signed   By: Guadlupe Spanish M.D.   On: 01/08/2021 14:47   VAS [place] AAA DUPLEX  Result Date: 01/02/2021 ABDOMINAL AORTA STUDY Indications: Family hx of AAA Risk Factors: Hypertension, hyperlipidemia, past history of smoking, prior MI,               coronary artery disease. Other Factors: Patient denies any abdominal pain. Limitations: Air/bowel gas.  Performing Technologist: Dondra Prader RVT  Examination Guidelines: A complete evaluation includes B-mode imaging, spectral Doppler, color Doppler, and power Doppler as needed of all accessible portions of each vessel. Bilateral testing is considered an integral part of a complete examination. Limited examinations for reoccurring indications may be performed as noted.  Abdominal Aorta Findings: +-----------+-------+----------+----------+--------+--------+--------+ Location   AP (cm)Trans (cm)PSV (cm/s)WaveformThrombusComments +-----------+-------+----------+----------+--------+--------+--------+ Proximal   1.90  1.90      73                                 +-----------+-------+----------+----------+--------+--------+--------+ Mid        1.90   1.80      86                                 +-----------+-------+----------+----------+--------+--------+--------+ Distal     1.70   1.70      67                                 +-----------+-------+----------+----------+--------+--------+--------+ RT CIA Prox1.1    1.3       96        biphasic                 +-----------+-------+----------+----------+--------+--------+--------+ RT EIA Mid 1.0    1.1       123       biphasic                  +-----------+-------+----------+----------+--------+--------+--------+ LT CIA Prox1.1    1.2       78        biphasic                 +-----------+-------+----------+----------+--------+--------+--------+ LT EIA Prox1.0    0.8       110       biphasic                 +-----------+-------+----------+----------+--------+--------+--------+ Visualization of the Right EIA Proximal artery, Right CIA Distal artery, Left EIA Proximal artery and Left CIA Distal artery was limited. IVC/Iliac Findings: +--------+------+   IVC   Patent +--------+------+ IVC Proxpatent +--------+------+  Summary: Abdominal Aorta: No evidence of an abdominal aortic aneurysm was visualized. The largest aortic measurement is 1.9 cm. No previous exam available for comparison. IVC/Iliac: There is no evidence of thrombus involving the IVC.  *See table(s) above for measurements and observations.  Electronically signed by Nanetta Batty MD on 01/02/2021 at 3:00:32 PM.    Final    ECHOCARDIOGRAM COMPLETE  Result Date: 01/08/2021    ECHOCARDIOGRAM REPORT   Patient Name:   Shane Welch Date of Exam: 01/08/2021 Medical Rec #:  [id number]   Height:       71.0 in Accession #:    [id number]  Weight:       218.5 lb Date of Birth:  [birth year]  BSA:          2.189 m Patient Age:    79 years    BP:           129/90 mmHg Patient Gender: M           HR:           82 bpm. [facility] Procedure: 2D Echo, Cardiac Doppler, Color Doppler and Intracardiac            Opacification Agent Indications:    TIA  History:        Patient has prior history of Echocardiogram examinations, most                 recent 11/03/2018. CAD and Previous Myocardial Infarction, TIA;  Risk Factors:Diabetes, Hypertension and Dyslipidemia.  Sonographer:    Lavenia Atlas RDCS Referring Phys: [id number] ASIA B ZIERLE-GHOSH IMPRESSIONS  1. Left ventricular ejection fraction, by estimation, is 50 to 55%. The left ventricle has normal  function. The left ventricle has no regional wall motion abnormalities. There is mild left ventricular hypertrophy. Left ventricular diastolic parameters are indeterminate.  2. Right ventricular systolic function is normal. The right ventricular size is normal.  3. The mitral valve is normal in structure. No evidence of mitral valve regurgitation. No evidence of mitral stenosis.  4. The aortic valve is tricuspid. Aortic valve regurgitation is not visualized. No aortic stenosis is present.  5. Aortic dilatation noted. 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  6. The inferior vena cava is normal in size with greater than 50% respiratory variability, suggesting right atrial pressure of 3 mmHg. FINDINGS  Left Ventricle: Left ventricular ejection fraction, by estimation, is 50 to 55%. The left ventricle has normal function. The left ventricle has no regional wall motion abnormalities. Definity contrast agent was given IV to delineate the left ventricular  endocardial borders. The left ventricular internal cavity size was normal in size. There is mild left ventricular hypertrophy. Left ventricular diastolic parameters are indeterminate. Right Ventricle: The right ventricular size is normal. No increase in right ventricular wall thickness. Right ventricular systolic function is normal. Left Atrium: Left atrial size was normal in size. Right Atrium: Right atrial size was normal in size. Pericardium: There is no evidence of pericardial effusion. Mitral Valve: The mitral valve is normal in structure. No evidence of mitral valve regurgitation. No evidence of mitral valve stenosis. Tricuspid Valve: The tricuspid valve is normal in structure. Tricuspid valve regurgitation is not demonstrated. No evidence of tricuspid stenosis. Aortic Valve: The aortic valve is tricuspid. Aortic valve regurgitation is not visualized. No aortic stenosis is present. Aortic valve mean gradient measures 1.8 mmHg. Aortic valve  peak gradient measures 3.5 mmHg. Aortic valve area, by VTI measures 3.91 cm. Pulmonic Valve: The pulmonic valve was not well visualized. Pulmonic valve regurgitation is mild. No evidence of pulmonic stenosis. Aorta: Aortic dilatation noted. 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 Pulmonary Artery: Indeterminant PASP, inadequate TR jet. Venous: The inferior vena cava is normal in size with greater than 50% respiratory variability, suggesting right atrial pressure of 3 mmHg. IAS/Shunts: No atrial level shunt detected by color flow Doppler.  LEFT VENTRICLE PLAX 2D LVIDd:         4.80 cm  Diastology LVIDs:         3.50 cm  LV e' medial:    4.90 cm/s LV PW:         1.30 cm  LV E/e' medial:  11.4 LV IVS:        1.20 cm  LV e' lateral:   6.74 cm/s LVOT diam:     2.30 cm  LV E/e' lateral: 8.3 LV SV:         82 LV SV Index:   37 LVOT Area:     4.15 cm  RIGHT VENTRICLE RV Basal diam:  3.20 cm RV S prime:     12.80 cm/s TAPSE (M-mode): 3.4 cm LEFT ATRIUM             Index       RIGHT ATRIUM           Index LA diam:        3.70 cm 1.69 cm/m  RA Area:     16.70 cm LA Vol (A2C):   80.2 ml 36.63 ml/m RA Volume:   42.80 ml  19.55 ml/m LA Vol (A4C):   57.8 ml 26.40 ml/m LA Biplane Vol: 71.1 ml 32.48 ml/m  AORTIC VALVE AV Area (Vmax):    3.90 cm AV Area (Vmean):   4.02 cm AV Area (VTI):     3.91 cm AV Vmax:           94.05 cm/s AV Vmean:          63.318 cm/s AV VTI:            0.210 m AV Peak Grad:      3.5 mmHg AV Mean Grad:      1.8 mmHg LVOT Vmax:         88.30 cm/s LVOT Vmean:        61.300 cm/s LVOT VTI:          0.197 m LVOT/AV VTI ratio: 0.94  AORTA Ao Root diam: 4.20 cm MITRAL VALVE MV Area (PHT): 3.48 cm    SHUNTS MV Decel Time: 218 msec    Systemic VTI:  0.20 m MV E velocity: 55.80 cm/s  Systemic Diam: 2.30 cm MV A velocity: 97.40 cm/s MV E/A ratio:  0.57 Dina Rich MD Electronically signed by Dina Rich MD Signature Date/Time: 01/08/2021/12:29:24 PM    Final      CBC Recent  Labs  Lab 01/07/21 2100  WBC 9.9  HGB 15.1  HCT 46.8  PLT 213  MCV 93.2  MCH 30.1  MCHC 32.3  RDW 13.4  LYMPHSABS 3.8  MONOABS 0.9  EOSABS 0.3  BASOSABS 0.1    Chemistries  Recent Labs  Lab 01/07/21 2100  NA 138  K 4.5  CL 104  CO2 24  GLUCOSE 165*  BUN 29*  CREATININE 1.67*  CALCIUM 8.9  AST 20  ALT 14  ALKPHOS 48  BILITOT 0.4   ------------------------------------------------------------------------------------------------------------------ Recent Labs    01/08/21 0630  CHOL 178  HDL 32*  LDLCALC 113*  TRIG 167*  CHOLHDL 5.6    Lab Results  Component Value Date   HGBA1C 6.8 (H) 12/24/2020   ------------------------------------------------------------------------------------------------------------------ No results for input(s): TSH, T4TOTAL, T3FREE, THYROIDAB in the last 72 hours.  Invalid input(s): FREET3 ------------------------------------------------------------------------------------------------------------------ No results for input(s): VITAMINB12, FOLATE, FERRITIN, TIBC, IRON, RETICCTPCT in the last 72 hours.  Coagulation profile Recent Labs  Lab 01/07/21 2100  INR 0.9    No results for input(s): DDIMER in the last 72 hours.  Cardiac Enzymes No results for input(s): CKMB, TROPONINI, MYOGLOBIN in the last 168 hours.  Invalid input(s): CK ------------------------------------------------------------------------------------------------------------------ No results found for: BNP   Shon Hale M.D on 01/09/2021 at 11:20 AM  Go to [email or web address] - for contact info  [facility] - Office  [phone]

## 2021-01-09 NOTE — Progress Notes (Signed)
Pt with periods of increased confusion and restlessness, also with intermittent visual loss and left side inattention. Pt has been dozing off and on this afternoon, has attempted to get OOB without assistance x3. Call bell in reach and can given good return demonstration of use when asked but does not use when he is alone in room.  Pt allowed to stand at bedside with assist and pivot to chair beside bed. Pt uses arms/hand appropriately then acts like he doesn't even realize he has a left arm. There is no warning of these episodes and they last less than one minute each time. Pt sat up on bench beside bed with staff member beside him for support and balance for about 10 minutes, then asked to lie back down. Pt had completely appropriate conversation during this time, but when he laid down his conversation was gibberish for a few moments, then cleared again. Currently, talking with wife and brother, denies any complaints.

## 2021-01-09 NOTE — Progress Notes (Signed)
EKG performed and placed on chart. 
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
## 2021-01-09 NOTE — Progress Notes (Signed)
Physical Therapy Treatment Patient Details Name: Shane Welch MRN: [id number] DOB: 12/19/1940 Today's Date: 01/09/2021    History of Present Illness Shane Welch  is a 80 y.o. male, with history of type 2 diabetes mellitus, hyperlipidemia, essential hypertension, CAD, and more presents the ED with a chief complaint of fall.  Patient reports that around dinnertime he stood up to get something out of the pantry when he felt dizzy and fell backwards hitting his head on a cabinet.  He reports that he remembers the whole thing but he knows he felt very weak and thought he might blackout.  After hitting his head he has no changes in vision or hearing.  Prior to his fall he had no chest pain, palpitations.  He does report that prior to his fall he had dysarthria, and ataxia when walking to the pantry.  He thinks the whole thing may be lasted 5 minutes however his wife reported that it lasted longer.  Patient reports that this time he feels completely back to normal except for these tired.  Patient has never had symptoms like this before.  Patient has no other complaints at this time.    PT Comments    Patient demonstrates slow labored movement for sitting up at bedside, once seated frequently leans to the left requiring verbal/tactile cueing to keep trunk in midline, appears to have some left sided neglect when given instructions to move to the left, very unsteady on feet, at high risk for falls due to left lateral leaning while standing and limited to a few side steps to transfer to chair.  Patient tolerated sitting up in chair after therapy with his family members present in room - RN aware.  Patient's orthostatic BP's as follows: lying flat 165/69, sitting 173/91, standing 117/64, and after sitting in chair 164/75 - RN notified.  Patient will benefit from continued physical therapy in hospital and recommended venue below to increase strength, balance, endurance for safe ADLs and gait.     Follow Up  Recommendations  SNF     Equipment Recommendations  Rolling walker with 5" wheels    Recommendations for Other Services       Precautions / Restrictions Precautions Precautions: Fall Restrictions Weight Bearing Restrictions: No    Mobility  Bed Mobility Overal bed mobility: Needs Assistance Bed Mobility: Supine to Sit     Supine to sit: Mod assist     General bed mobility comments: slow labored movement    Transfers Overall transfer level: Needs assistance Equipment used: Rolling walker (2 wheeled);None;1 person hand held assist Transfers: Stand Pivot Transfers;Sit to/from Stand Sit to Stand: Min assist Stand pivot transfers: Mod assist       General transfer comment: very unsteady with frequent leaning to the left  Ambulation/Gait Ambulation/Gait assistance: Mod assist;Max assist Gait Distance (Feet): 4 Feet Assistive device: Rolling walker (2 wheeled) Gait Pattern/deviations: Decreased step length - left;Decreased stance time - right;Decreased stride length;Ataxic Gait velocity: decreased   General Gait Details: very unsteady with frequent leaning to the left, limited to 4-5 slow labored side steps at bedside, c/o increased dizziness when standing with BP dropping   Stairs             Wheelchair Mobility    Modified Rankin (Stroke Patients Only)       Balance Overall balance assessment: Needs assistance Sitting-balance support: Feet supported;No upper extremity supported Sitting balance-Leahy Scale: Poor Sitting balance - Comments: fair/poor with frequent leaning to the left Postural control: Left lateral  lean Standing balance support: During functional activity;No upper extremity supported Standing balance-Leahy Scale: Poor Standing balance comment: fair/poor using RW, frequent leaning to the left when standing without AD                            Cognition Arousal/Alertness: Awake/alert Behavior During Therapy: WFL for tasks  assessed/performed Overall Cognitive Status: Impaired/Different from baseline Area of Impairment: Memory;Orientation;Safety/judgement;Problem solving                 Orientation Level: Disoriented to;Situation;Place;Time       Safety/Judgement: Decreased awareness of safety   Problem Solving: Slow processing;Requires verbal cues;Requires tactile cues        Exercises General Exercises - Lower Extremity Hip Flexion/Marching: Seated;AROM;Strengthening;Both;15 reps    General Comments        Pertinent Vitals/Pain Pain Assessment: No/denies pain    Home Living                      Prior Function            PT Goals (current goals can now be found in the care plan section) Acute Rehab PT Goals Patient Stated Goal: return home with family to assist PT Goal Formulation: With patient Time For Goal Achievement: 01/23/21 Potential to Achieve Goals: Good Progress towards PT goals: Progressing toward goals    Frequency    Min 5X/week      PT Plan Discharge plan needs to be updated    Co-evaluation              AM-PAC PT "6 Clicks" Mobility   Outcome Measure  Help needed turning from your back to your side while in a flat bed without using bedrails?: A Little Help needed moving from lying on your back to sitting on the side of a flat bed without using bedrails?: A Lot Help needed moving to and from a bed to a chair (including a wheelchair)?: A Lot Help needed standing up from a chair using your arms (e.g., wheelchair or bedside chair)?: A Lot Help needed to walk in hospital room?: A Lot Help needed climbing 3-5 steps with a railing? : Total 6 Click Score: 12    End of Session   Activity Tolerance: Patient tolerated treatment well;Patient limited by fatigue Patient left: in chair;with call bell/phone within reach;with chair alarm set;with family/visitor present Nurse Communication: Mobility status PT Visit Diagnosis: Unsteadiness on feet  (R26.81);Other abnormalities of gait and mobility (R26.89);Muscle weakness (generalized) (M62.81)     Time: 1005-1040 PT Time Calculation (min) (ACUTE ONLY): 35 min  Charges:  $Therapeutic Activity: 23-37 mins                     10:55 AM, 01/09/21 Ocie Bob, MPT Physical Therapist with [facility] [phone] office [phone] mobile phone
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
## 2021-01-09 NOTE — Progress Notes (Signed)
Pt up in recliner after working with PT. Denies c/o. Thigh high TED hose applied per order of MD Courage. Reason for hose explained to pt, wife and son, who stated understanding.  Pt alert and oriented to self, place and situation. Carries on appropriate conversation. Handed pt his oral meds to take and pt took them with his left hand without difficulty. Pt then stated, "Where's my medicine?". When I told him they were in his left hand, pt stated, "I don't see them". Pt's gaze blank. When asked to look at my nose, pt moved head towards me but his eyes were staring straight ahead. Pt asked to look left and right, but eyes did not move. He said, "I don't see you." Then immediately after this statement, pt turned his head to the left and said, "Oh, there you are!". Gaze normal, follows my finger appropriately, pupils PERLA. Pt's peripheral vision WNL. Pt able to follow all commands normally and resumed his previous conversation. Wife states that he had this same "come and go" vision and memory issue yesterday. VSS. MD Courage notified.

## 2021-01-09 NOTE — Clinical Social Work Note (Signed)
Spoke with spouse, wants PNC. Auth started . Referral sent.

## 2021-01-09 NOTE — Progress Notes (Addendum)
1800 - Pt noted by passing staff member to be getting up out of recliner unassisted. Per staff member, pt went down onto left knee (was holding onto wall near bathroom) and then raised himself back up before she could get into room. Staff in to assist pt to side of bed. Pt oriented only to self and place, states, "I gotta go home." Pt reminded that he cannot go home today due to his stroke and blood pressure issues. Pt states, "I know, but I gotta go. Call [name]." Reminded pt that his wife just left and is driving home. Assured pt that I would call her and let her know that he fell. Pt stated, "OK". Pt noted to have new abrasion to left knee area, no bleeding, denies c/o pain. VSS. MD Courage notified via [facility] page. MD has placed order for pt to have 1:1 safety sitter.  1825: Pt's wife [name] notified via phone of fall, abrasion to left knee, and plans for 1:1 safety sitter. States understanding.

## 2021-01-09 NOTE — Progress Notes (Signed)
Patient in room , actively hallucinating and confused.  Patient still trying to get oob without assitance, and does not understand his surroundings.

## 2021-01-09 NOTE — Progress Notes (Signed)
Pt combative and agitated at  this time.. security in room  and EKG on hold

## 2021-01-10 ENCOUNTER — Ambulatory Visit: Payer: Medicare PPO

## 2021-01-10 DIAGNOSIS — I639 Cerebral infarction, unspecified: Secondary | ICD-10-CM | POA: Diagnosis not present

## 2021-01-10 LAB — GLUCOSE, CAPILLARY
Glucose-Capillary: 139 mg/dL — ABNORMAL HIGH (ref 70–99)
Glucose-Capillary: 134 mg/dL — ABNORMAL HIGH (ref 70–99)
Glucose-Capillary: 130 mg/dL — ABNORMAL HIGH (ref 70–99)
Glucose-Capillary: 123 mg/dL — ABNORMAL HIGH (ref 70–99)

## 2021-01-10 MED ORDER — HALOPERIDOL LACTATE 5 MG/ML IJ SOLN
2.0000 mg | Freq: Once | INTRAMUSCULAR | Status: AC
  Administered 2021-01-10: 2 mg via INTRAMUSCULAR
  Filled 2021-01-10: qty 1

## 2021-01-10 MED ORDER — SENNOSIDES-DOCUSATE SODIUM 8.6-50 MG PO TABS
2.0000 | ORAL_TABLET | Freq: Every day | ORAL | Status: DC
  Filled 2021-01-10: qty 2

## 2021-01-10 MED ORDER — MIDODRINE HCL 5 MG PO TABS
10.0000 mg | ORAL_TABLET | Freq: Three times a day (TID) | ORAL | Status: DC
  Administered 2021-01-11 (×2): 10 mg via ORAL
  Filled 2021-01-10 (×3): qty 2

## 2021-01-10 NOTE — Progress Notes (Signed)
SLP Cancellation Note  Patient Details Name: Shane Welch MRN: [id number] DOB: [birth year]   Cancelled treatment:       Reason Eval/Treat Not Completed: Patient's level of consciousness;Fatigue/lethargy limiting ability to participate. ST will continue efforts, thank you  Nimsi Males H. Romie Levee, CCC-SLP Speech Language Pathologist    Georgetta Haber 01/10/2021, 2:52 PM

## 2021-01-10 NOTE — Progress Notes (Signed)
Appropriate Use Committee Chart Review  Chart reviewed by the physician advisor with input from [facility] and the attending MD as needed for review of the appropriateness for SNF referral.  TOC notes, PT/OT/ST notes, nursing notes and physician notes reviewed for medical necessity to determine if the patient's needs are appropriate for short-term rehab to return to a prior level of function versus the likely need for custodial care.  At this time, the patient meets Medicare criteria for SNF placement.  As per discussion with attending MD today, prior to this admission, patient was quite functional and independent.  Hospital course complicated by acute CVA, with acute mental status changes and orthostatic hypotension.  Per MD, no known history of dementia.   Recommendations: The patient is SNF appropriate for short-term rehab/skilled nursing interventions.  A consult to the Transitions of Care Team has been made to facilitate placement.    Marcellus Scott, MD [facility]/Physician Advisor  01/10/2021 12:49 PM

## 2021-01-10 NOTE — Progress Notes (Signed)
Pt calm and resting with eyes closed, wife at bedside. Soft wrist restraints removed as patient has been resting quietly and showing no s/s of aggression at this time. Upon removing restraints, pt wakes and is pleasant and cooperative. Will continue to monitor.

## 2021-01-10 NOTE — Progress Notes (Signed)
[facility] Shane Welch A. Shane Pilgrim, MD     [email or web address]          Shane Welch is an 80 y.o. male.   Assessment/Plan: 1.  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  Midodrine will be increased to 10 mg 3 times a day.   Thigh-high support stocking approximately 15-20 mmHg is also recommended if available.  Physiotherapy as recommended by PT.   2.  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  Dual antiplatelet agents are recommended for 3 weeks.  Afterwards aspirin is recommended.  Continue with statin medication, blood pressure and diabetes control. 3.  Multivessel intracranial occlusive disease 4. Severe agitation likely due to sundowning but no baseline history of dementia. Suggest using p.r.n. doses of medications for now either benzodiazepine and/orHaldol.   The patient has had multiple episodes of severe agitation requiring 4 point restraints and the a few doses of medication. He is quite stuporous this morning. The physical therapist reported that yesterday he was still quite dizzy with 30 point drop in systolic blood pressure.   GENERAL:  Mostly unresponsive in bed at this time.  He is in four-point restraints.  HEENT: Neck is supple no trauma noted.  EXTREMITIES: No edema; significant arthritic changes of the hands  BACK: Normal  SKIN: Normal by inspection.    MENTAL STATUS:   These in bed with eyes closed and opens eyes to deep painful stimuli but does not follow commands and closes size rapidly. He does moans and groans.  CRANIAL NERVES: Pupils are equal, round and reactive to ligh; upper and lower facial muscles are normal in strength and symmetric, there is no flattening of the nasolabial folds  MOTOR: Normal tone, bulk and strength; no pronator drift.  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  SENSATION:   Response to pain bilaterally.     Objective: Vital signs in last 24 hours: Temp:  [97.2 F (36.2 C)-99.8 F (37.7 C)] 97.2 F (36.2 C) (03/24 0532) Pulse Rate:  [60-90] 90 (03/24 0532) Resp:  [16-20] 20 (03/24 0532) BP: (124-161)/(57-109) 148/89 (03/24 0532) SpO2:  [92 %-100 %] 98 % (03/24 0532)  Intake/Output from previous day: 03/23 0701 - 03/24 0700 In: 320 [P.O.:320] Out: 700 [Urine:700] Intake/Output this shift: No intake/output data recorded. Nutritional status:  Diet Order            Diet Heart Room service appropriate? Yes; Fluid consistency: Thin  Diet effective now                  Lab Results: Results for orders placed or performed during the hospital encounter of 01/07/21 (from the past 48 hour(s))  Glucose, capillary     Status: Abnormal   Collection Time: 01/08/21 11:49 AM  Result Value Ref Range   Glucose-Capillary 162 (H) 70 - 99 mg/dL    Comment: Glucose reference range applies only to samples taken after fasting for at least 8 hours.   Comment 1 Notify RN   Glucose, capillary     Status: Abnormal   Collection Time: 01/08/21  3:51 PM  Result Value Ref Range   Glucose-Capillary 107 (H) 70 - 99 mg/dL    Comment: Glucose reference range applies only to samples taken after fasting for at least 8 hours.   Comment 1 Notify RN    Comment 2 Document in Chart   Glucose, capillary     Status: Abnormal   Collection  Time: 01/09/21  8:34 AM  Result Value Ref Range   Glucose-Capillary 143 (H) 70 - 99 mg/dL    Comment: Glucose reference range applies only to samples taken after fasting for at least 8 hours.  Glucose, capillary     Status: Abnormal   Collection Time: 01/09/21 11:36 AM  Result Value Ref Range   Glucose-Capillary 190 (H) 70 - 99 mg/dL    Comment: Glucose reference range applies only to samples taken after fasting for at least 8 hours.  Glucose, capillary     Status: Abnormal   Collection Time: 01/09/21  4:51 PM  Result Value Ref Range    Glucose-Capillary 126 (H) 70 - 99 mg/dL    Comment: Glucose reference range applies only to samples taken after fasting for at least 8 hours.  Glucose, capillary     Status: Abnormal   Collection Time: 01/09/21 11:00 PM  Result Value Ref Range   Glucose-Capillary 187 (H) 70 - 99 mg/dL    Comment: Glucose reference range applies only to samples taken after fasting for at least 8 hours.   Comment 1 Notify RN    Comment 2 Document in Chart     Lipid Panel Recent Labs    01/08/21 0630  CHOL 178  TRIG 167*  HDL 32*  CHOLHDL 5.6  VLDL 33  LDLCALC 580*    Studies/Results:   Medications:  Scheduled Meds: . aspirin EC  81 mg Oral Daily  . atorvastatin  40 mg Oral QPM  . clopidogrel  75 mg Oral Daily  . enalapril  2.5 mg Oral Daily  . heparin  5,000 Units Subcutaneous Q8H  . insulin aspart  0-5 Units Subcutaneous QHS  . insulin aspart  0-9 Units Subcutaneous TID WC  . midodrine  5 mg Oral TID WC   Continuous Infusions: PRN Meds:.acetaminophen **OR** acetaminophen (TYLENOL) oral liquid 160 mg/5 mL **OR** acetaminophen, senna-docusate     LOS: 1 day   Terisha Losasso A. Shane Welch, M.D.  Diplomate, Biomedical engineer of Psychiatry and Neurology ( Neurology).
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
## 2021-01-10 NOTE — Clinical Social Work Note (Signed)
Advised Mrs. Schuelke that [facility] declined patient. Provided other available facilities. Mrs. Pitsenbarger states they are hoping to take patient home. She will stay tonight with patient to try and get him out of this "volitile state" he is in.  Provided contact information for her to return contact with staff.     Tarrell Debes, Juleen China, LCSW
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
## 2021-01-10 NOTE — Progress Notes (Addendum)
Patient Demographics:    Shane Welch, is a 80 y.o. male, DOB - 05-06-1941, GNF:[id number]  Admit date - 01/07/2021   Admitting Physician Jazmeen Axtell Mariea ClontsEmokpae, MD  Outpatient Primary MD for the patient is Kaleen MaskElkins, Wilson Oliver, MD  LOS - 1  Chief Complaint  Patient presents with  . Fall        Subjective:    Shane Welch today has no fevers, no emesis,  No chest pain,   --More coherent at this time, eating breakfast -RN and wife at bedside -Patient is unsteady with orthostatic concerns had a fall in the hospital on 01/08/2021 with wife present, had another fall on 01/09/2021 --Overnight patient was confused and agitated--required sedative medications as well as physical restraints   Assessment  & Plan :    Principal Problem:   Acute CVA (cerebrovascular accident)/Rt occipital Stroke  Active Problems:   Essential hypertension   CAD (coronary artery disease)   Diabetes (HCC)   Hypertensive emergency  Brief Summary:- 80 y.o. male, with history of type 2 diabetes mellitus, hyperlipidemia, essential hypertension, CAD admitted with dizziness, ataxia and dysarthria and found to have right occipital stroke x2 with high-grade stenosis of P2 PCA and with significant orthostatic hypotension/dysfunction  A/p 1)Acute Right Occipital Stroke--- MRI brain/MRA head consistent with Two small acute right occipital lobe cortical infarcts. High-grade stenosis of the proximal right P2 PCA (near fetal origin right PCA). 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 presenting with significant orthostatics -Midodrine tablets and thigh-high TED stockings was advised -Patient with fall, at risk for recurrent falls  3)HTN--patient presented with hypertensive emergency,  --allow for permissive hypertension -Currently on enalapril 2.5 mg daily  4)DM2-A1c 6.8, reflecting excellent DM control PTA Use Novolog/Humalog Sliding scale insulin with Accu-Cheks/Fingersticks as ordered   5)Social/Ethics--- plan of care discussed with patient, his wife and the son at bedside --Patient is a DNR  6)Disposition/unsteady gait/ambulatory dysfunction/orthostatic dizziness------Patient is unsteady with orthostatic concerns had a fall in the hospital on 01/08/2021 with wife present, had another fall on 01/09/2021 --Overnight patient was confused and agitated--required sedative medications as well as physical restraints -Recurrent falls---PTA pt lived alone and did very poorly, patient has significant limitations with mobility related ADLs- this patient needs to continue to be monitored in the hospital until a SNF bed is obtained as she is not safe to go home with her current physcical limitations --Prior to this acute stroke and hospitalization patient was independent with ADLs at home, patient was driving and functioning at a very high level--patient was actually doing his taxes when he experienced stroke symptoms   Disposition/Need for in-Hospital Stay- patient unable to be discharged at this time due to --acute stroke with unsteady gait and falls, unsafe discharge home awaiting SNF placement for rehab ---  Disposition: The patient is from: Home  Anticipated d/c is to: SNF              Anticipated d/c date is: 1 day              Patient currently is medically stable to d/c. Barriers: Not Clinically Stable- -awaiting SNF  bed  Code Status :  -  Code Status: DNR   Family Communication:    (patient is alert, awake and coherent)  -Discussed with his wife and son   Consults  :  Neuro  DVT Prophylaxis  :   - SCDs \\Place  TED hose Start: 01/09/21 1100 heparin injection 5,000 Units Start: 01/08/21 0600 SCD's Start: 01/08/21 0252    Lab Results  Component Value Date   PLT 213 01/07/2021    Inpatient Medications  Scheduled Meds: . aspirin EC  81 mg Oral Daily  . atorvastatin  40 mg Oral QPM  . clopidogrel  75 mg Oral Daily  . enalapril  2.5 mg Oral Daily  . heparin  5,000 Units Subcutaneous Q8H  . insulin aspart  0-5 Units Subcutaneous QHS  . insulin aspart  0-9 Units Subcutaneous TID WC  . [START ON 01/11/2021] midodrine  10 mg Oral TID WC  . senna-docusate  2 tablet Oral QHS   Continuous Infusions: PRN Meds:.acetaminophen **OR** acetaminophen (TYLENOL) oral liquid 160 mg/5 mL **OR** acetaminophen    Anti-infectives (From admission, onward)   None        Objective:   Vitals:   01/09/21 2258 01/10/21 0205 01/10/21 0532 01/10/21 1302  BP: (!) 124/57 (!) 152/109 (!) 148/89 (!) 147/67  Pulse: 79 66 90 (!) 46  Resp: 20 17 20 18   Temp: 99.8 F (37.7 C) 99 F (37.2 C) (!) 97.2 F (36.2 C) 98 F (36.7 C)  TempSrc: Oral Oral  Oral  SpO2: 92% 100% 98% 98%  Weight:      Height:        Wt Readings from Last 3 Encounters:  01/08/21 99.1 kg  12/24/20 100.8 kg  11/22/19 100 kg     Intake/Output Summary (Last 24 hours) at 01/10/2021 1726 Last data filed at 01/10/2021 1030 Gross per 24 hour  Intake 340 ml  Output 300 ml  Net 40 ml    Physical Exam  Gen:- Awake Alert,  In no apparent distress  HEENT:- [place].AT, No sclera icterus Neck-Supple Neck,No JVD,.  Lungs-  CTAB , fair symmetrical air movement CV- S1, S2 normal, regular  Abd-  +ve B.Sounds, Abd Soft, No tenderness,    Extremity/Skin:- No  edema, pedal pulses present  Psych-affect is appropriate, oriented x3 (in the evenings  patient has episodes of confusion and agitation lately) Neuro-very unsteady gait, ataxia and dizziness noted , no additional new focal deficits, no tremors   Data Review:   Micro Results Recent Results (from the past 240 hour(s))  Resp Panel by RT-PCR (Flu A&B, Covid) Nasopharyngeal Swab     Status: None   Collection Time: 01/08/21  2:00 AM   Specimen: Nasopharyngeal Swab; Nasopharyngeal(NP) swabs in vial transport medium  Result Value Ref Range Status   SARS Coronavirus 2 by RT PCR NEGATIVE NEGATIVE Final    Comment: (NOTE) SARS-CoV-2 target nucleic acids are NOT DETECTED.  The SARS-CoV-2 RNA is generally detectable in upper respiratory specimens during the acute phase of infection. The lowest concentration of SARS-CoV-2 viral copies this assay can detect is 138 copies/mL. A negative result does not preclude SARS-Cov-2 infection and should not be used as the sole basis  for treatment or other patient management decisions. A negative result may occur with  improper specimen collection/handling, submission of specimen other than nasopharyngeal swab, presence of viral mutation(s) within the areas targeted by this assay, and inadequate number of viral copies(<138 copies/mL). A negative result must be combined with clinical observations, patient history, and epidemiological information. The expected result is Negative.  Fact Sheet for Patients:  BloggerCourse.com  Fact Sheet for Healthcare Providers:  SeriousBroker.it  This test is no t yet approved or cleared by the [place] FDA and  has been authorized for detection and/or diagnosis of SARS-CoV-2 by FDA under an Emergency Use Authorization (EUA). This EUA will remain  in effect (meaning this test can be used) for the duration of the COVID-19 declaration under Section 564(b)(1) of the Act, 21 U.S.C.section 360bbb-3(b)(1), unless the authorization is terminated  or revoked  sooner.       Influenza A by PCR NEGATIVE NEGATIVE Final   Influenza B by PCR NEGATIVE NEGATIVE Final    Comment: (NOTE) The Xpert Xpress SARS-CoV-2/FLU/RSV plus assay is intended as an aid in the diagnosis of influenza from Nasopharyngeal swab specimens and should not be used as a sole basis for treatment. Nasal washings and aspirates are unacceptable for Xpert Xpress SARS-CoV-2/FLU/RSV testing.  Fact Sheet for Patients: BloggerCourse.com  Fact Sheet for Healthcare Providers: SeriousBroker.it  This test is not yet approved or cleared by the [place] FDA and has been authorized for detection and/or diagnosis of SARS-CoV-2 by FDA under an Emergency Use Authorization (EUA). This EUA will remain in effect (meaning this test can be used) for the duration of the COVID-19 declaration under Section 564(b)(1) of the Act, 21 U.S.C. section 360bbb-3(b)(1), unless the authorization is terminated or revoked.  Performed at [facility], [address] [place], [place] [address]     Radiology Reports CT HEAD WO CONTRAST  Result Date: 01/07/2021 CLINICAL DATA:  Fall at home EXAM: CT HEAD WITHOUT CONTRAST TECHNIQUE: Contiguous axial images were obtained from the base of the skull through the vertex without intravenous contrast. COMPARISON:  None. FINDINGS: Brain: No evidence of acute territorial infarction, hemorrhage, hydrocephalus,extra-axial collection or mass lesion/mass effect. There is dilatation the ventricles and sulci consistent with age-related atrophy. Low-attenuation changes in the deep white matter consistent with small vessel ischemia. Vascular: No hyperdense vessel or unexpected calcification. Skull: The skull is intact. No fracture or focal lesion identified. Sinuses/Orbits: The visualized paranasal sinuses and mastoid air cells are clear. The orbits and globes intact. Other: None IMPRESSION: No acute intracranial abnormality.  Findings consistent with age related atrophy and chronic small vessel ischemia Electronically Signed   By: Jonna Clark M.D.   On: 01/07/2021 23:54   MR ANGIO HEAD WO CONTRAST  Result Date: 01/08/2021 CLINICAL DATA:  Weakness, fall EXAM: MRI HEAD WITHOUT CONTRAST MRA HEAD WITHOUT CONTRAST MRA NECK WITHOUT AND WITH CONTRAST TECHNIQUE: Multiplanar, multiecho pulse sequences of the brain and surrounding structures were obtained without intravenous contrast. Angiographic images of the Circle of Willis were obtained using MRA technique without intravenous contrast. Angiographic images of the neck were obtained using MRA technique without and with intravenous contrast. Carotid stenosis measurements (when applicable) are obtained utilizing NASCET criteria, using the distal internal carotid diameter as the denominator. CONTRAST:  10mL GADAVIST GADOBUTROL 1 MMOL/ML IV SOLN COMPARISON:  None. FINDINGS: MRI HEAD Brain: There are two small foci mildly reduced cortical diffusion in the right occipital lobe. No evidence of intracranial hemorrhage. Prominence of the ventricles and sulci reflects  generalized parenchymal volume loss. 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 Minimal patchy T2 hyperintensity in the supratentorial white matter is nonspecific but may reflect minor chronic microvascular ischemic changes. Ovoid as motion in the fall there is no intracranial mass, mass effect, or edema. There is no hydrocephalus or extra-axial fluid collection. Vascular: Major vessel flow voids at the skull base are preserved. Skull and upper cervical spine: Normal marrow signal is preserved. Sinuses/Orbits: Paranasal sinuses are aerated. Orbits are unremarkable. Other: Sella is unremarkable.  Mastoid air cells are clear. MRA HEAD Intracranial internal carotid arteries are patent. Middle and anterior  cerebral arteries are patent. Intracranial vertebral arteries are patent. Basilar artery is patent. Basilar artery becomes diminutive after superior cerebellar artery origins terminating as a small right P1 PCA. Near fetal origin of the right PCA and fetal origin of the left PCA. There is high-grade stenosis of the proximal P2 PCA with no apparent distal flow related enhancement. On postcontrast MRA neck, flow is likely present with additional P2 stenoses. There is no aneurysm. MRA NECK Common, internal, and external carotid arteries are patent. Mild plaque at the right greater than left ICA origins without stenosis. Vertebral artery origins are poorly evaluated. Otherwise patent and codominant without stenosis. IMPRESSION: Two small acute right occipital lobe cortical infarcts. High-grade stenosis of the proximal right P2 PCA (near fetal origin right PCA). Mild plaque at the ICA origins.  No stenosis. Electronically Signed   By: Guadlupe Spanish M.D.   On: 01/08/2021 14:47   MR ANGIO NECK W WO CONTRAST  Result Date: 01/08/2021 CLINICAL DATA:  Weakness, fall EXAM: MRI HEAD WITHOUT CONTRAST MRA HEAD WITHOUT CONTRAST MRA NECK WITHOUT AND WITH CONTRAST TECHNIQUE: Multiplanar, multiecho pulse sequences of the brain and surrounding structures were obtained without intravenous contrast. Angiographic images of the Circle of Willis were obtained using MRA technique without intravenous contrast. Angiographic images of the neck were obtained using MRA technique without and with intravenous contrast. Carotid stenosis measurements (when applicable) are obtained utilizing NASCET criteria, using the distal internal carotid diameter as the denominator. CONTRAST:  10mL GADAVIST GADOBUTROL 1 MMOL/ML IV SOLN COMPARISON:  None. FINDINGS: MRI HEAD Brain: There are two small foci mildly reduced cortical diffusion in the right occipital lobe. No evidence of intracranial hemorrhage. Prominence of the ventricles and sulci reflects  generalized parenchymal volume loss. 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 Minimal patchy T2 hyperintensity in the supratentorial white matter is nonspecific but may reflect minor chronic microvascular ischemic changes. Ovoid as motion in the fall there is no intracranial mass, mass effect, or edema. There is no hydrocephalus or extra-axial fluid collection. Vascular: Major vessel flow voids at the skull base are preserved. Skull and upper cervical spine: Normal marrow signal is preserved. Sinuses/Orbits: Paranasal sinuses are aerated. Orbits are unremarkable. Other: Sella is unremarkable.  Mastoid air cells are clear. MRA HEAD Intracranial internal carotid arteries are patent. Middle and anterior cerebral arteries are patent. Intracranial vertebral arteries are patent. Basilar artery is patent. Basilar artery becomes diminutive after superior cerebellar artery origins terminating as a small right P1 PCA. Near fetal origin of the right PCA and fetal origin of the left PCA. There is high-grade stenosis of the proximal P2 PCA with no apparent distal flow related enhancement. On postcontrast MRA neck, flow is  likely present with additional P2 stenoses. There is no aneurysm. MRA NECK Common, internal, and external carotid arteries are patent. Mild plaque at the right greater than left ICA origins without stenosis. Vertebral artery origins are poorly evaluated. Otherwise patent and codominant without stenosis. IMPRESSION: Two small acute right occipital lobe cortical infarcts. High-grade stenosis of the proximal right P2 PCA (near fetal origin right PCA). Mild plaque at the ICA origins.  No stenosis. Electronically Signed   By: Guadlupe Spanish M.D.   On: 01/08/2021 14:47   MR BRAIN WO CONTRAST  Result Date: 01/08/2021 CLINICAL DATA:  Weakness, fall EXAM: MRI HEAD WITHOUT CONTRAST MRA  HEAD WITHOUT CONTRAST MRA NECK WITHOUT AND WITH CONTRAST TECHNIQUE: Multiplanar, multiecho pulse sequences of the brain and surrounding structures were obtained without intravenous contrast. Angiographic images of the Circle of Willis were obtained using MRA technique without intravenous contrast. Angiographic images of the neck were obtained using MRA technique without and with intravenous contrast. Carotid stenosis measurements (when applicable) are obtained utilizing NASCET criteria, using the distal internal carotid diameter as the denominator. CONTRAST:  10mL GADAVIST GADOBUTROL 1 MMOL/ML IV SOLN COMPARISON:  None. FINDINGS: MRI HEAD Brain: There are two small foci mildly reduced cortical diffusion in the right occipital lobe. No evidence of intracranial hemorrhage. Prominence of the ventricles and sulci reflects generalized parenchymal volume loss. 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 Minimal patchy T2 hyperintensity in the supratentorial white matter is nonspecific but may reflect minor chronic microvascular ischemic changes. Ovoid as motion in the fall there is no intracranial mass, mass effect, or edema. There is no hydrocephalus or extra-axial fluid collection. Vascular: Major vessel flow voids at the skull base are preserved. Skull and upper cervical spine: Normal marrow signal is preserved. Sinuses/Orbits: Paranasal sinuses are aerated. Orbits are unremarkable. Other: Sella is unremarkable.  Mastoid air cells are clear. MRA HEAD Intracranial internal carotid arteries are patent. Middle and anterior cerebral arteries are patent. Intracranial vertebral arteries are patent. Basilar artery is patent. Basilar artery becomes diminutive after superior cerebellar artery origins terminating as a small right P1 PCA. Near fetal origin of the right PCA and fetal origin of the left  PCA. There is high-grade stenosis of the proximal P2 PCA with no apparent distal flow related enhancement. On postcontrast MRA neck, flow is likely present with additional P2 stenoses. There is no aneurysm. MRA NECK Common, internal, and external carotid arteries are patent. Mild plaque at the right greater than left ICA origins without stenosis. Vertebral artery origins are poorly evaluated. Otherwise patent and codominant without stenosis. IMPRESSION: Two small acute right occipital lobe cortical infarcts. High-grade stenosis of the proximal right P2 PCA (near fetal origin right PCA). Mild plaque at the ICA origins.  No stenosis. Electronically Signed   By: Guadlupe Spanish M.D.   On: 01/08/2021 14:47   VAS [place] AAA DUPLEX  Result Date: 01/02/2021 ABDOMINAL AORTA STUDY Indications: Family hx of AAA Risk Factors: Hypertension, hyperlipidemia, past history of smoking, prior MI,               coronary artery disease. Other Factors: Patient denies any abdominal pain. Limitations: Air/bowel gas.  Performing Technologist: Dondra Prader RVT  Examination Guidelines: A complete evaluation includes B-mode imaging, spectral Doppler, color Doppler, and power Doppler as needed of all accessible portions of each vessel. Bilateral testing is considered an integral part of a complete examination. Limited  examinations for reoccurring indications may be performed as noted.  Abdominal Aorta Findings: +-----------+-------+----------+----------+--------+--------+--------+ Location   AP (cm)Trans (cm)PSV (cm/s)WaveformThrombusComments +-----------+-------+----------+----------+--------+--------+--------+ Proximal   1.90   1.90      73                                 +-----------+-------+----------+----------+--------+--------+--------+ Mid        1.90   1.80      86                                 +-----------+-------+----------+----------+--------+--------+--------+ Distal     1.70   1.70      67                                  +-----------+-------+----------+----------+--------+--------+--------+ RT CIA Prox1.1    1.3       96        biphasic                 +-----------+-------+----------+----------+--------+--------+--------+ RT EIA Mid 1.0    1.1       123       biphasic                 +-----------+-------+----------+----------+--------+--------+--------+ LT CIA Prox1.1    1.2       78        biphasic                 +-----------+-------+----------+----------+--------+--------+--------+ LT EIA Prox1.0    0.8       110       biphasic                 +-----------+-------+----------+----------+--------+--------+--------+ Visualization of the Right EIA Proximal artery, Right CIA Distal artery, Left EIA Proximal artery and Left CIA Distal artery was limited. IVC/Iliac Findings: +--------+------+   IVC   Patent +--------+------+ IVC Proxpatent +--------+------+  Summary: Abdominal Aorta: No evidence of an abdominal aortic aneurysm was visualized. The largest aortic measurement is 1.9 cm. No previous exam available for comparison. IVC/Iliac: There is no evidence of thrombus involving the IVC.  *See table(s) above for measurements and observations.  Electronically signed by Nanetta Batty MD on 01/02/2021 at 3:00:32 PM.    Final    ECHOCARDIOGRAM COMPLETE  Result Date: 01/08/2021    ECHOCARDIOGRAM REPORT   Patient Name:   CHAUN ELLENBERG Date of Exam: 01/08/2021 Medical Rec #:  [id number]   Height:       71.0 in Accession #:    [id number]  Weight:       218.5 lb Date of Birth:  [birth year]  BSA:          2.189 m Patient Age:    79 years    BP:           129/90 mmHg Patient Gender: M           HR:           82 bpm. [facility] Procedure: 2D Echo, Cardiac Doppler, Color Doppler and Intracardiac            Opacification Agent Indications:    TIA  History:        Patient has prior history of Echocardiogram examinations, most  recent 11/03/2018. CAD and Previous  Myocardial Infarction, TIA;                 Risk Factors:Diabetes, Hypertension and Dyslipidemia.  Sonographer:    Lavenia Atlas RDCS Referring Phys: [id number] ASIA B ZIERLE-GHOSH IMPRESSIONS  1. Left ventricular ejection fraction, by estimation, is 50 to 55%. The left ventricle has normal function. The left ventricle has no regional wall motion abnormalities. There is mild left ventricular hypertrophy. Left ventricular diastolic parameters are indeterminate.  2. Right ventricular systolic function is normal. The right ventricular size is normal.  3. The mitral valve is normal in structure. No evidence of mitral valve regurgitation. No evidence of mitral stenosis.  4. The aortic valve is tricuspid. Aortic valve regurgitation is not visualized. No aortic stenosis is present.  5. Aortic dilatation noted. 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  6. The inferior vena cava is normal in size with greater than 50% respiratory variability, suggesting right atrial pressure of 3 mmHg. FINDINGS  Left Ventricle: Left ventricular ejection fraction, by estimation, is 50 to 55%. The left ventricle has normal function. The left ventricle has no regional wall motion abnormalities. Definity contrast agent was given IV to delineate the left ventricular  endocardial borders. The left ventricular internal cavity size was normal in size. There is mild left ventricular hypertrophy. Left ventricular diastolic parameters are indeterminate. Right Ventricle: The right ventricular size is normal. No increase in right ventricular wall thickness. Right ventricular systolic function is normal. Left Atrium: Left atrial size was normal in size. Right Atrium: Right atrial size was normal in size. Pericardium: There is no evidence of pericardial effusion. Mitral Valve: The mitral valve is normal in structure. No evidence of mitral valve regurgitation. No evidence of mitral valve stenosis. Tricuspid Valve: The tricuspid valve  is normal in structure. Tricuspid valve regurgitation is not demonstrated. No evidence of tricuspid stenosis. Aortic Valve: The aortic valve is tricuspid. Aortic valve regurgitation is not visualized. No aortic stenosis is present. Aortic valve mean gradient measures 1.8 mmHg. Aortic valve peak gradient measures 3.5 mmHg. Aortic valve area, by VTI measures 3.91 cm. Pulmonic Valve: The pulmonic valve was not well visualized. Pulmonic valve regurgitation is mild. No evidence of pulmonic stenosis. Aorta: Aortic dilatation noted. 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 Pulmonary Artery: Indeterminant PASP, inadequate TR jet. Venous: The inferior vena cava is normal in size with greater than 50% respiratory variability, suggesting right atrial pressure of 3 mmHg. IAS/Shunts: No atrial level shunt detected by color flow Doppler.  LEFT VENTRICLE PLAX 2D LVIDd:         4.80 cm  Diastology LVIDs:         3.50 cm  LV e' medial:    4.90 cm/s LV PW:         1.30 cm  LV E/e' medial:  11.4 LV IVS:        1.20 cm  LV e' lateral:   6.74 cm/s LVOT diam:     2.30 cm  LV E/e' lateral: 8.3 LV SV:         82 LV SV Index:   37 LVOT Area:     4.15 cm  RIGHT VENTRICLE RV Basal diam:  3.20 cm RV S prime:     12.80 cm/s TAPSE (M-mode): 3.4 cm LEFT ATRIUM             Index       RIGHT ATRIUM  Index LA diam:        3.70 cm 1.69 cm/m  RA Area:     16.70 cm LA Vol (A2C):   80.2 ml 36.63 ml/m RA Volume:   42.80 ml  19.55 ml/m LA Vol (A4C):   57.8 ml 26.40 ml/m LA Biplane Vol: 71.1 ml 32.48 ml/m  AORTIC VALVE AV Area (Vmax):    3.90 cm AV Area (Vmean):   4.02 cm AV Area (VTI):     3.91 cm AV Vmax:           94.05 cm/s AV Vmean:          63.318 cm/s AV VTI:            0.210 m AV Peak Grad:      3.5 mmHg AV Mean Grad:      1.8 mmHg LVOT Vmax:         88.30 cm/s LVOT Vmean:        61.300 cm/s LVOT VTI:          0.197 m LVOT/AV VTI ratio: 0.94  AORTA Ao Root diam: 4.20 cm MITRAL VALVE MV Area (PHT): 3.48 cm     SHUNTS MV Decel Time: 218 msec    Systemic VTI:  0.20 m MV E velocity: 55.80 cm/s  Systemic Diam: 2.30 cm MV A velocity: 97.40 cm/s MV E/A ratio:  0.57 Dina Rich MD Electronically signed by Dina Rich MD Signature Date/Time: 01/08/2021/12:29:24 PM    Final      CBC Recent Labs  Lab 01/07/21 2100  WBC 9.9  HGB 15.1  HCT 46.8  PLT 213  MCV 93.2  MCH 30.1  MCHC 32.3  RDW 13.4  LYMPHSABS 3.8  MONOABS 0.9  EOSABS 0.3  BASOSABS 0.1    Chemistries  Recent Labs  Lab 01/07/21 2100  NA 138  K 4.5  CL 104  CO2 24  GLUCOSE 165*  BUN 29*  CREATININE 1.67*  CALCIUM 8.9  AST 20  ALT 14  ALKPHOS 48  BILITOT 0.4   ------------------------------------------------------------------------------------------------------------------ Recent Labs    01/08/21 0630  CHOL 178  HDL 32*  LDLCALC 113*  TRIG 167*  CHOLHDL 5.6    Lab Results  Component Value Date   HGBA1C 6.8 (H) 12/24/2020   ------------------------------------------------------------------------------------------------------------------ No results for input(s): TSH, T4TOTAL, T3FREE, THYROIDAB in the last 72 hours.  Invalid input(s): FREET3 ------------------------------------------------------------------------------------------------------------------ No results for input(s): VITAMINB12, FOLATE, FERRITIN, TIBC, IRON, RETICCTPCT in the last 72 hours.  Coagulation profile Recent Labs  Lab 01/07/21 2100  INR 0.9    No results for input(s): DDIMER in the last 72 hours.  Cardiac Enzymes No results for input(s): CKMB, TROPONINI, MYOGLOBIN in the last 168 hours.  Invalid input(s): CK ------------------------------------------------------------------------------------------------------------------ No results found for: BNP   Shon Hale M.D on 01/10/2021 at 5:26 PM  Go to [email or web address] - for contact info  [facility] - Office  [phone]

## 2021-01-11 DIAGNOSIS — I639 Cerebral infarction, unspecified: Secondary | ICD-10-CM | POA: Diagnosis not present

## 2021-01-11 LAB — GLUCOSE, CAPILLARY
Glucose-Capillary: 125 mg/dL — ABNORMAL HIGH (ref 70–99)
Glucose-Capillary: 227 mg/dL — ABNORMAL HIGH (ref 70–99)

## 2021-01-11 MED ORDER — METFORMIN HCL 1000 MG PO TABS: 1000.0000 mg | ORAL_TABLET | Freq: Two times a day (BID) | ORAL | 3 refills | Status: DC

## 2021-01-11 MED ORDER — CLOPIDOGREL BISULFATE 75 MG PO TABS: 75.0000 mg | ORAL_TABLET | Freq: Every day | ORAL | 11 refills | Status: DC

## 2021-01-11 MED ORDER — ATORVASTATIN CALCIUM 40 MG PO TABS: 40.0000 mg | ORAL_TABLET | Freq: Every evening | ORAL | 5 refills | Status: DC

## 2021-01-11 MED ORDER — ASPIRIN 81 MG PO TBEC: 81.0000 mg | DELAYED_RELEASE_TABLET | Freq: Every day | ORAL | 0 refills | Status: DC

## 2021-01-11 MED ORDER — MIDODRINE HCL 10 MG PO TABS: 10.0000 mg | ORAL_TABLET | Freq: Three times a day (TID) | ORAL | 3 refills | Status: DC

## 2021-01-11 MED ORDER — SENNOSIDES-DOCUSATE SODIUM 8.6-50 MG PO TABS: 2.0000 | ORAL_TABLET | Freq: Every day | ORAL | 3 refills | Status: DC

## 2021-01-11 MED ORDER — ASPIRIN 81 MG PO TBEC: 81.0000 mg | DELAYED_RELEASE_TABLET | Freq: Every day | ORAL | 11 refills | Status: DC

## 2021-01-11 MED ORDER — CLOPIDOGREL BISULFATE 75 MG PO TABS: 75.0000 mg | ORAL_TABLET | Freq: Every day | ORAL | 6 refills | Status: DC

## 2021-01-11 NOTE — Progress Notes (Signed)
  2200 pt appearing oriented and pleasant. Answering all orientation questions correctly and able to explain his behavior from previous night. Sitter and wife at bedside, remaining calm. Orthostatics positive, on call hospitalist notified, rechecked BP lying down, no interventions needed, per hospitalist.  2300 [facility], pt continuing to stay in bed, follow directions and call when needed.   0545 pt asleep all night. No distress noted.

## 2021-01-11 NOTE — TOC Transition Note (Signed)
Transition of Care [facility]) - CM/SW Discharge Note   Patient Details  Name: Shane Welch MRN: [id number] Date of Birth: 1941/04/27  [facility]) CM/SW Contact:  Leitha Bleak, RN Phone Number: 01/11/2021, 4:24 PM   Clinical Narrative:   Patient;s only bed offer was [facility]. [name] - wife went to [place] for a tour, she called TOC saying she did not think he would do well there. She states he is better today and wants to take him home with home health. There son will also be home to help.  Denyse Amass with [facility] can accept for PT and start soon.  Patient has already discharged, needing a walker, wife was going to pick one up. TOC called and ask if okay to drop ship, she is agreeable. Thereasa Distance with [facility] accept the referral and will ship to patients home.     Final next level of care: Home w Home Health Services Barriers to Discharge: Barriers Resolved   Patient Goals and CMS Choice Patient states their goals for this hospitalization and ongoing recovery are:: to go home. CMS Medicare.gov Compare Post Acute Care list provided to:: Patient Represenative (must comment) Choice offered to / list presented to : Spouse  Discharge Placement              Patient chooses bed at:  [facility]) Patient to be transferred to facility by: Family Name of family member notified: [name] - wife Patient and family notified of of transfer: 01/11/21  Discharge Plan and Services      Date DME Agency Contacted: 01/11/21 Time DME Agency Contacted: 1400 Representative spoke with at DME Agency: Shelby Dubin Arranged: RN,PT,Social Work [facility] Agency: [facility] Date [facility] Agency Contacted: 01/11/21 Time HH Agency Contacted: 1421 Representative spoke with at [facility] Agency: Denyse Amass   Readmission Risk Interventions Readmission Risk Prevention Plan 01/11/2021  Post Dischage Appt Complete  Medication Screening Complete  Transportation Screening Complete  Some recent data might be hidden

## 2021-01-11 NOTE — Progress Notes (Signed)
SLP Cancellation Note  Patient Details Name: Shane Welch MRN: [id number] DOB: [birth year]   Cancelled treatment:       Reason Eval/Treat Not Completed: Patient at procedure or test/unavailable. ST will re-attempt as schedule permits this afternoon. Thank you,  Kendyll Huettner H. Romie Levee, CCC-SLP Speech Language Pathologist    Georgetta Haber 01/11/2021, 10:57 AM

## 2021-01-11 NOTE — Discharge Summary (Signed)
Shane Welch, is a 80 y.o. male  DOB [birth year]  MRN [id number].  Admission date:  01/07/2021  Admitting Physician  Shon Hale, MD  Discharge Date:  01/11/2021   Primary MD  Kaleen Mask, MD  Recommendations for primary care physician for things to follow:   1)You are taking Plavix and Aspirin  which are blood thinners so Please Avoid ibuprofen/Advil/Aleve/Motrin/Goody Powders/Naproxen/BC powders/Meloxicam/Diclofenac/Indomethacin and other Nonsteroidal anti-inflammatory medications as these will make you more likely to bleed and can cause stomach ulcers, can also cause Kidney problems.   2)Please take Aspirin 81 mg daily along with Plavix 75 mg daily for 30 days then after that STOP the aspirin and continue ONLY Plavix 75 mg daily indefinitely--for stroke prevention  3)Please take Midodrine 10 mg tablet 3 times a day to to prevent your blood pressure dropping so low that you might pass out  4)Please follow-up with Neurologist Dr. Beryle Beams-- Phone: [phone], Address: [address], [place], [place] [address] in 4 to 6 weeks for recheck and reevaluation.  Please call to make appointment with him  5)Stop enalapril and Flomax as this can contribute to blood pressure drops which can make you more likely to fall  Admission Diagnosis  TIA (transient ischemic attack) [G45.9] Hypertensive emergency [I16.1] Fall, initial encounter [W19.XXXA] Acute CVA (cerebrovascular accident) [facility]) [I63.9]   Discharge Diagnosis  TIA (transient ischemic attack) [G45.9] Hypertensive emergency [I16.1] Fall, initial encounter [W19.XXXA] Acute CVA (cerebrovascular accident) (HCC) [I63.9]    Principal Problem:   Acute CVA (cerebrovascular accident)/Rt occipital Stroke  Active Problems:   Essential hypertension   CAD (coronary artery disease)   Diabetes [facility])   Hypertensive emergency      Past  Medical History:  Diagnosis Date  . Abnormal echocardiogram 03/11/2016  . Acute MI anterior wall first episode care [facility])   . CAD (coronary artery disease) 12/10/2015  . CAD (coronary artery disease), native coronary artery   . Diabetes (HCC) 12/10/2015  . Essential hypertension 09/03/2017  . Hyperlipidemia   . 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 Type II diabetes mellitus (HCC)    a. Dx ~ 2010.    Past Surgical History:  Procedure Laterality Date  . CARDIAC CATHETERIZATION N/A 12/08/2015   Procedure: Left Heart Cath and Coronary Angiography;  Surgeon: Corky Crafts, MD;  Location: [facility] INVASIVE CV LAB;  Service: Cardiovascular;  Laterality: N/A;  . CARDIAC CATHETERIZATION  12/08/2015   Procedure: Coronary Stent Intervention;  Surgeon: Corky Crafts, MD;  Location: [facility] INVASIVE CV LAB;  Service: Cardiovascular;;  . CERVICAL SPINE SURGERY     a. ~ 2002.  . Right Knee Surgery     a. 08/2015.      HPI  from the history and physical done on the day of admission:    Shane Welch  is a 80 y.o. male, with history of type 2 diabetes mellitus, hyperlipidemia, essential hypertension, CAD, and more presents the ED with a chief complaint of fall.  Patient reports that around dinnertime he  stood up to get something out of the pantry when he felt dizzy and fell backwards hitting his head on a cabinet.  He reports that he remembers the whole thing but he knows he felt very weak and thought he might blackout.  After hitting his head he has no changes in vision or hearing.  Prior to his fall he had no chest pain, palpitations.  He does report that prior to his fall he had dysarthria, and ataxia when walking to the pantry.  He thinks the whole thing may be lasted 5 minutes however his wife reported that it lasted longer.  Patient reports that this time he feels completely back to normal except for these tired.  Patient has never had symptoms like this before.  Patient has no other complaints at  this time.  Patient does not smoke, does not drink alcohol, does not use illicit drugs.  He is not vaccinated for COVID.  Patient is DNR.  In the ED Temp 98.2, heart rate 51-84, respiratory rate 15-20, blood pressure as high as 190/97 White blood cell count 9.9, hemoglobin 15 Chemistry panel reveals an elevated creatinine of 1.67, baseline 1.5 ED provider spoke with telemetry neuro who recommended TIA work-up Telemetry neuro also recommended lipid panel, hemoglobin A1c, Plavix load followed by Plavix daily, aspirin daily Neurochecks every 4 hours then per shift had been elevated and orthostatic vital signs   Hospital Course:    Brief Summary:- 80 y.o.male,with history of type 2 diabetes mellitus, hyperlipidemia, essential hypertension, CAD admitted with dizziness, ataxia and dysarthria and found to have right occipital stroke x2 with high-grade stenosis of P2 PCA and with significant orthostatic hypotension/dysfunction  A/p 1)Acute Right Occipital Stroke--- MRI brain/MRA head consistent with Two small acute right occipital lobe cortical infarcts. High-grade stenosis of the proximal right P2 PCA (near fetal origin right PCA). 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 presenting with significant orthostatics -Midodrine tablets and thigh-high TED stockings was advised -Patient with fall, at risk for recurrent falls - 3)HTN--patient presented with hypertensive emergency,  --allow for  permissive hypertension -Given orthostatic hypotension avoid over aggressive BP control  4)DM2-A1c 6.8, reflecting excellent DM control PTA   5)Social/Ethics--- plan of care discussed with patient, his wife and the son at bedside --Patient is a DNR  6)Disposition--due to limited SNF bed offers, patient's wife has decided to take patient home at this time--with home health physical therapy and RN --Unsteady gait and orthostatic hypotension improving   Disposition-patient's wife declined SNF bed offer, she wanted a different facility that did not have a bed offer for patient--- she wants to take patient home with home health services  Disposition: The patient is from: Home  Anticipated d/c is to: Home with home health PT and RN  Code Status :  -  Code Status: DNR   Family Communication:    (patient is alert, awake and coherent)  -Discussed with his wife and son   Consults  :  Neuro  Discharge Condition: Stable  Follow UP   Follow-up Information    Beryle Beams, MD. Schedule  an appointment as soon as possible for a visit in 4 week(s).   Specialty: Neurology Why: Stroke follow- up  Contact information: [address] [place] [address] [phone]        Kaleen Mask, MD. Schedule an appointment as soon as possible for a visit in 2 month(s).   Specialty: Family Medicine Contact information: [address] [place] [address] [phone]        Corky Crafts, MD .   Specialties: Cardiology, Radiology, Interventional Cardiology Contact information: [address] [place] [address] [phone]                Diet and Activity recommendation:  As advised  Discharge Instructions    Discharge Instructions    Call MD for:  difficulty breathing, headache or visual disturbances   Complete by: As directed    Call MD for:  persistant dizziness or light-headedness   Complete by:  As directed    Call MD for:  persistant nausea and vomiting   Complete by: As directed    Call MD for:  temperature >100.4   Complete by: As directed    Diet - low sodium heart healthy   Complete by: As directed    Diet Carb Modified   Complete by: As directed    Discharge instructions   Complete by: As directed    1)You are taking Plavix and Aspirin  which are blood thinners so Please Avoid ibuprofen/Advil/Aleve/Motrin/Goody Powders/Naproxen/BC powders/Meloxicam/Diclofenac/Indomethacin and other Nonsteroidal anti-inflammatory medications as these will make you more likely to bleed and can cause stomach ulcers, can also cause Kidney problems.   2)Please take Aspirin 81 mg daily along with Plavix 75 mg daily for 30 days then after that STOP the aspirin and continue ONLY Plavix 75 mg daily indefinitely--for stroke prevention  3)Please take Midodrine 10 mg tablet 3 times a day to to prevent your blood pressure dropping so low that you might pass out  4)Please follow-up with Neurologist Dr. Beryle Beams-- Phone: [phone], Address: [address], [place], [place] [address] in 4 to 6 weeks for recheck and reevaluation.  Please call to make appointment with him  5)Stop enalapril and Flomax as this can contribute to blood pressure drops which can make you more likely to fall   Increase activity slowly   Complete by: As directed         Discharge Medications     Allergies as of 01/11/2021      Reactions   Sulfa Antibiotics Hives      Medication List    STOP taking these medications   enalapril 5 MG tablet Commonly known as: VASOTEC   nitroGLYCERIN 0.4 MG SL tablet Commonly known as: NITROSTAT   tamsulosin 0.4 MG Caps capsule Commonly known as: FLOMAX     TAKE these medications   acetaminophen 325 MG tablet Commonly known as: TYLENOL Take 650 mg by mouth every 6 (six) hours as needed for mild pain, moderate pain or headache.   aspirin 81 MG EC tablet Take 1  tablet (81 mg total) by mouth daily. Swallow whole. 1)Please take Aspirin 81 mg daily along with Plavix 75 mg daily for 30 days then after that STOP the aspirin and continue ONLY Plavix 75 mg daily indefinitely--for stroke prevention Start taking on: January 12, 2021 What changed: additional instructions   atorvastatin 40 MG tablet Commonly known as: LIPITOR Take 1 tablet (40 mg total) by mouth every evening.  clopidogrel 75 MG tablet Commonly known as: PLAVIX Take 1 tablet (75 mg total) by mouth daily. Please take Aspirin 81 mg daily along with Plavix 75 mg daily for 30 days then after that STOP the aspirin and continue ONLY Plavix 75 mg daily indefinitely--for stroke prevention Start taking on: January 12, 2021   glipiZIDE 5 MG 24 hr tablet Commonly known as: GLUCOTROL XL Take 5 mg by mouth daily with breakfast.   metFORMIN 1000 MG tablet Commonly known as: GLUCOPHAGE Take 1 tablet (1,000 mg total) by mouth 2 (two) times daily with a meal. What changed:   how much to take  when to take this   midodrine 10 MG tablet Commonly known as: PROAMATINE Take 1 tablet (10 mg total) by mouth 3 (three) times daily with meals.   senna-docusate 8.6-50 MG tablet Commonly known as: Senokot-S Take 2 tablets by mouth at bedtime.            Durable Medical Equipment  (From admission, onward)         Start     Ordered   01/11/21 1428  DME Walker  Once       Question Answer Comment  Walker: With 5 Inch Wheels   Patient needs a walker to treat with the following condition Acute CVA (cerebrovascular accident) (HCC)      01/11/21 1429          Major procedures and Radiology Reports - PLEASE review detailed and final reports for all details, in brief -    CT HEAD WO CONTRAST  Result Date: 01/07/2021 CLINICAL DATA:  Fall at home EXAM: CT HEAD WITHOUT CONTRAST TECHNIQUE: Contiguous axial images were obtained from the base of the skull through the vertex without intravenous contrast.  COMPARISON:  None. FINDINGS: Brain: No evidence of acute territorial infarction, hemorrhage, hydrocephalus,extra-axial collection or mass lesion/mass effect. There is dilatation the ventricles and sulci consistent with age-related atrophy. Low-attenuation changes in the deep white matter consistent with small vessel ischemia. Vascular: No hyperdense vessel or unexpected calcification. Skull: The skull is intact. No fracture or focal lesion identified. Sinuses/Orbits: The visualized paranasal sinuses and mastoid air cells are clear. The orbits and globes intact. Other: None IMPRESSION: No acute intracranial abnormality. Findings consistent with age related atrophy and chronic small vessel ischemia Electronically Signed   By: Jonna Clark M.D.   On: 01/07/2021 23:54   MR ANGIO HEAD WO CONTRAST  Result Date: 01/08/2021 CLINICAL DATA:  Weakness, fall EXAM: MRI HEAD WITHOUT CONTRAST MRA HEAD WITHOUT CONTRAST MRA NECK WITHOUT AND WITH CONTRAST TECHNIQUE: Multiplanar, multiecho pulse sequences of the brain and surrounding structures were obtained without intravenous contrast. Angiographic images of the Circle of Willis were obtained using MRA technique without intravenous contrast. Angiographic images of the neck were obtained using MRA technique without and with intravenous contrast. Carotid stenosis measurements (when applicable) are obtained utilizing NASCET criteria, using the distal internal carotid diameter as the denominator. CONTRAST:  32mL GADAVIST GADOBUTROL 1 MMOL/ML IV SOLN COMPARISON:  None. FINDINGS: MRI HEAD Brain: There are two small foci mildly reduced cortical diffusion in the right occipital lobe. No evidence of intracranial hemorrhage. Prominence of the ventricles and sulci reflects generalized parenchymal volume loss. 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 Minimal  patchy T2 hyperintensity in the supratentorial white matter is nonspecific but may reflect minor chronic microvascular ischemic changes. Ovoid as motion in the  fall there is no intracranial mass, mass effect, or edema. There is no hydrocephalus or extra-axial fluid collection. Vascular: Major vessel flow voids at the skull base are preserved. Skull and upper cervical spine: Normal marrow signal is preserved. Sinuses/Orbits: Paranasal sinuses are aerated. Orbits are unremarkable. Other: Sella is unremarkable.  Mastoid air cells are clear. MRA HEAD Intracranial internal carotid arteries are patent. Middle and anterior cerebral arteries are patent. Intracranial vertebral arteries are patent. Basilar artery is patent. Basilar artery becomes diminutive after superior cerebellar artery origins terminating as a small right P1 PCA. Near fetal origin of the right PCA and fetal origin of the left PCA. There is high-grade stenosis of the proximal P2 PCA with no apparent distal flow related enhancement. On postcontrast MRA neck, flow is likely present with additional P2 stenoses. There is no aneurysm. MRA NECK Common, internal, and external carotid arteries are patent. Mild plaque at the right greater than left ICA origins without stenosis. Vertebral artery origins are poorly evaluated. Otherwise patent and codominant without stenosis. IMPRESSION: Two small acute right occipital lobe cortical infarcts. High-grade stenosis of the proximal right P2 PCA (near fetal origin right PCA). Mild plaque at the ICA origins.  No stenosis. Electronically Signed   By: Guadlupe Spanish M.D.   On: 01/08/2021 14:47   MR ANGIO NECK W WO CONTRAST  Result Date: 01/08/2021 CLINICAL DATA:  Weakness, fall EXAM: MRI HEAD WITHOUT CONTRAST MRA HEAD WITHOUT CONTRAST MRA NECK WITHOUT AND WITH CONTRAST TECHNIQUE: Multiplanar, multiecho pulse sequences of the brain and surrounding structures were obtained without intravenous contrast. Angiographic images  of the Circle of Willis were obtained using MRA technique without intravenous contrast. Angiographic images of the neck were obtained using MRA technique without and with intravenous contrast. Carotid stenosis measurements (when applicable) are obtained utilizing NASCET criteria, using the distal internal carotid diameter as the denominator. CONTRAST:  10mL GADAVIST GADOBUTROL 1 MMOL/ML IV SOLN COMPARISON:  None. FINDINGS: MRI HEAD Brain: There are two small foci mildly reduced cortical diffusion in the right occipital lobe. No evidence of intracranial hemorrhage. Prominence of the ventricles and sulci reflects generalized parenchymal volume loss. 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 Minimal patchy T2 hyperintensity in the supratentorial white matter is nonspecific but may reflect minor chronic microvascular ischemic changes. Ovoid as motion in the fall there is no intracranial mass, mass effect, or edema. There is no hydrocephalus or extra-axial fluid collection. Vascular: Major vessel flow voids at the skull base are preserved. Skull and upper cervical spine: Normal marrow signal is preserved. Sinuses/Orbits: Paranasal sinuses are aerated. Orbits are unremarkable. Other: Sella is unremarkable.  Mastoid air cells are clear. MRA HEAD Intracranial internal carotid arteries are patent. Middle and anterior cerebral arteries are patent. Intracranial vertebral arteries are patent. Basilar artery is patent. Basilar artery becomes diminutive after superior cerebellar artery origins terminating as a small right P1 PCA. Near fetal origin of the right PCA and fetal origin of the left PCA. There is high-grade stenosis of the proximal P2 PCA with no apparent distal flow related enhancement. On postcontrast MRA neck, flow is likely present with additional P2 stenoses. There is no aneurysm. MRA  NECK Common, internal, and external carotid arteries are patent. Mild plaque at the right greater than left ICA origins without stenosis. Vertebral artery origins are poorly evaluated. Otherwise patent and codominant without stenosis. IMPRESSION: Two small acute right occipital lobe cortical  infarcts. High-grade stenosis of the proximal right P2 PCA (near fetal origin right PCA). Mild plaque at the ICA origins.  No stenosis. Electronically Signed   By: Guadlupe Spanish M.D.   On: 01/08/2021 14:47   MR BRAIN WO CONTRAST  Result Date: 01/08/2021 CLINICAL DATA:  Weakness, fall EXAM: MRI HEAD WITHOUT CONTRAST MRA HEAD WITHOUT CONTRAST MRA NECK WITHOUT AND WITH CONTRAST TECHNIQUE: Multiplanar, multiecho pulse sequences of the brain and surrounding structures were obtained without intravenous contrast. Angiographic images of the Circle of Willis were obtained using MRA technique without intravenous contrast. Angiographic images of the neck were obtained using MRA technique without and with intravenous contrast. Carotid stenosis measurements (when applicable) are obtained utilizing NASCET criteria, using the distal internal carotid diameter as the denominator. CONTRAST:  59mL GADAVIST GADOBUTROL 1 MMOL/ML IV SOLN COMPARISON:  None. FINDINGS: MRI HEAD Brain: There are two small foci mildly reduced cortical diffusion in the right occipital lobe. No evidence of intracranial hemorrhage. Prominence of the ventricles and sulci reflects generalized parenchymal volume loss. 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 Minimal patchy T2 hyperintensity in the supratentorial white matter is nonspecific but may reflect minor chronic microvascular ischemic changes. Ovoid as motion in the fall there is no intracranial mass, mass effect, or edema. There is no hydrocephalus or extra-axial fluid collection.  Vascular: Major vessel flow voids at the skull base are preserved. Skull and upper cervical spine: Normal marrow signal is preserved. Sinuses/Orbits: Paranasal sinuses are aerated. Orbits are unremarkable. Other: Sella is unremarkable.  Mastoid air cells are clear. MRA HEAD Intracranial internal carotid arteries are patent. Middle and anterior cerebral arteries are patent. Intracranial vertebral arteries are patent. Basilar artery is patent. Basilar artery becomes diminutive after superior cerebellar artery origins terminating as a small right P1 PCA. Near fetal origin of the right PCA and fetal origin of the left PCA. There is high-grade stenosis of the proximal P2 PCA with no apparent distal flow related enhancement. On postcontrast MRA neck, flow is likely present with additional P2 stenoses. There is no aneurysm. MRA NECK Common, internal, and external carotid arteries are patent. Mild plaque at the right greater than left ICA origins without stenosis. Vertebral artery origins are poorly evaluated. Otherwise patent and codominant without stenosis. IMPRESSION: Two small acute right occipital lobe cortical infarcts. High-grade stenosis of the proximal right P2 PCA (near fetal origin right PCA). Mild plaque at the ICA origins.  No stenosis. Electronically Signed   By: Guadlupe Spanish M.D.   On: 01/08/2021 14:47   VAS [place] AAA DUPLEX  Result Date: 01/02/2021 ABDOMINAL AORTA STUDY Indications: Family hx of AAA Risk Factors: Hypertension, hyperlipidemia, past history of smoking, prior MI,               coronary artery disease. Other Factors: Patient denies any abdominal pain. Limitations: Air/bowel gas.  Performing Technologist: Dondra Prader RVT  Examination Guidelines: A complete evaluation includes B-mode imaging, spectral Doppler, color Doppler, and power Doppler as needed of all accessible portions of each vessel. Bilateral testing is considered an integral part of a complete examination. Limited examinations for  reoccurring indications may be performed as noted.  Abdominal Aorta Findings: +-----------+-------+----------+----------+--------+--------+--------+ Location   AP (cm)Trans (cm)PSV (cm/s)WaveformThrombusComments +-----------+-------+----------+----------+--------+--------+--------+ Proximal   1.90   1.90      73                                 +-----------+-------+----------+----------+--------+--------+--------+  Mid        1.90   1.80      86                                 +-----------+-------+----------+----------+--------+--------+--------+ Distal     1.70   1.70      67                                 +-----------+-------+----------+----------+--------+--------+--------+ RT CIA Prox1.1    1.3       96        biphasic                 +-----------+-------+----------+----------+--------+--------+--------+ RT EIA Mid 1.0    1.1       123       biphasic                 +-----------+-------+----------+----------+--------+--------+--------+ LT CIA Prox1.1    1.2       78        biphasic                 +-----------+-------+----------+----------+--------+--------+--------+ LT EIA Prox1.0    0.8       110       biphasic                 +-----------+-------+----------+----------+--------+--------+--------+ Visualization of the Right EIA Proximal artery, Right CIA Distal artery, Left EIA Proximal artery and Left CIA Distal artery was limited. IVC/Iliac Findings: +--------+------+   IVC   Patent +--------+------+ IVC Proxpatent +--------+------+  Summary: Abdominal Aorta: No evidence of an abdominal aortic aneurysm was visualized. The largest aortic measurement is 1.9 cm. No previous exam available for comparison. IVC/Iliac: There is no evidence of thrombus involving the IVC.  *See table(s) above for measurements and observations.  Electronically signed by Nanetta Batty MD on 01/02/2021 at 3:00:32 PM.    Final    ECHOCARDIOGRAM COMPLETE  Result Date:  01/08/2021    ECHOCARDIOGRAM REPORT   Patient Name:   BALDO HUFNAGLE Date of Exam: 01/08/2021 Medical Rec #:  [id number]   Height:       71.0 in Accession #:    [id number]  Weight:       218.5 lb Date of Birth:  30-Sep-1941  BSA:          2.189 m Patient Age:    79 years    BP:           129/90 mmHg Patient Gender: M           HR:           82 bpm. [facility] Procedure: 2D Echo, Cardiac Doppler, Color Doppler and Intracardiac            Opacification Agent Indications:    TIA  History:        Patient has prior history of Echocardiogram examinations, most                 recent 11/03/2018. CAD and Previous Myocardial Infarction, TIA;                 Risk Factors:Diabetes, Hypertension and Dyslipidemia.  Sonographer:    Lavenia Atlas RDCS Referring Phys: [id number] ASIA B ZIERLE-GHOSH IMPRESSIONS  1. Left ventricular ejection fraction, by estimation, is 50 to 55%. The left ventricle has  normal function. The left ventricle has no regional wall motion abnormalities. There is mild left ventricular hypertrophy. Left ventricular diastolic parameters are indeterminate.  2. Right ventricular systolic function is normal. The right ventricular size is normal.  3. The mitral valve is normal in structure. No evidence of mitral valve regurgitation. No evidence of mitral stenosis.  4. The aortic valve is tricuspid. Aortic valve regurgitation is not visualized. No aortic stenosis is present.  5. Aortic dilatation noted. 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  6. The inferior vena cava is normal in size with greater than 50% respiratory variability, suggesting right atrial pressure of 3 mmHg. FINDINGS  Left Ventricle: Left ventricular ejection fraction, by estimation, is 50 to 55%. The left ventricle has normal function. The left ventricle has no regional wall motion abnormalities. Definity contrast agent was given IV to delineate the left ventricular  endocardial borders. The left ventricular  internal cavity size was normal in size. There is mild left ventricular hypertrophy. Left ventricular diastolic parameters are indeterminate. Right Ventricle: The right ventricular size is normal. No increase in right ventricular wall thickness. Right ventricular systolic function is normal. Left Atrium: Left atrial size was normal in size. Right Atrium: Right atrial size was normal in size. Pericardium: There is no evidence of pericardial effusion. Mitral Valve: The mitral valve is normal in structure. No evidence of mitral valve regurgitation. No evidence of mitral valve stenosis. Tricuspid Valve: The tricuspid valve is normal in structure. Tricuspid valve regurgitation is not demonstrated. No evidence of tricuspid stenosis. Aortic Valve: The aortic valve is tricuspid. Aortic valve regurgitation is not visualized. No aortic stenosis is present. Aortic valve mean gradient measures 1.8 mmHg. Aortic valve peak gradient measures 3.5 mmHg. Aortic valve area, by VTI measures 3.91 cm. Pulmonic Valve: The pulmonic valve was not well visualized. Pulmonic valve regurgitation is mild. No evidence of pulmonic stenosis. Aorta: Aortic dilatation noted. 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 Pulmonary Artery: Indeterminant PASP, inadequate TR jet. Venous: The inferior vena cava is normal in size with greater than 50% respiratory variability, suggesting right atrial pressure of 3 mmHg. IAS/Shunts: No atrial level shunt detected by color flow Doppler.  LEFT VENTRICLE PLAX 2D LVIDd:         4.80 cm  Diastology LVIDs:         3.50 cm  LV e' medial:    4.90 cm/s LV PW:         1.30 cm  LV E/e' medial:  11.4 LV IVS:        1.20 cm  LV e' lateral:   6.74 cm/s LVOT diam:     2.30 cm  LV E/e' lateral: 8.3 LV SV:         82 LV SV Index:   37 LVOT Area:     4.15 cm  RIGHT VENTRICLE RV Basal diam:  3.20 cm RV S prime:     12.80 cm/s TAPSE (M-mode): 3.4 cm LEFT ATRIUM             Index       RIGHT ATRIUM            Index LA diam:        3.70 cm 1.69 cm/m  RA Area:     16.70 cm LA Vol (A2C):   80.2 ml 36.63 ml/m RA Volume:   42.80 ml  19.55 ml/m LA Vol (A4C):   57.8 ml 26.40 ml/m  LA Biplane Vol: 71.1 ml 32.48 ml/m  AORTIC VALVE AV Area (Vmax):    3.90 cm AV Area (Vmean):   4.02 cm AV Area (VTI):     3.91 cm AV Vmax:           94.05 cm/s AV Vmean:          63.318 cm/s AV VTI:            0.210 m AV Peak Grad:      3.5 mmHg AV Mean Grad:      1.8 mmHg LVOT Vmax:         88.30 cm/s LVOT Vmean:        61.300 cm/s LVOT VTI:          0.197 m LVOT/AV VTI ratio: 0.94  AORTA Ao Root diam: 4.20 cm MITRAL VALVE MV Area (PHT): 3.48 cm    SHUNTS MV Decel Time: 218 msec    Systemic VTI:  0.20 m MV E velocity: 55.80 cm/s  Systemic Diam: 2.30 cm MV A velocity: 97.40 cm/s MV E/A ratio:  0.57 Dina RichJonathan Branch MD Electronically signed by Dina RichJonathan Branch MD Signature Date/Time: 01/08/2021/12:29:24 PM    Final    Micro Results   Recent Results (from the past 240 hour(s))  Resp Panel by RT-PCR (Flu A&B, Covid) Nasopharyngeal Swab     Status: None   Collection Time: 01/08/21  2:00 AM   Specimen: Nasopharyngeal Swab; Nasopharyngeal(NP) swabs in vial transport medium  Result Value Ref Range Status   SARS Coronavirus 2 by RT PCR NEGATIVE NEGATIVE Final    Comment: (NOTE) SARS-CoV-2 target nucleic acids are NOT DETECTED.  The SARS-CoV-2 RNA is generally detectable in upper respiratory specimens during the acute phase of infection. The lowest concentration of SARS-CoV-2 viral copies this assay can detect is 138 copies/mL. A negative result does not preclude SARS-Cov-2 infection and should not be used as the sole basis for treatment or other patient management decisions. A negative result may occur with  improper specimen collection/handling, submission of specimen other than nasopharyngeal swab, presence of viral mutation(s) within the areas targeted by this assay, and inadequate number of viral copies(<138 copies/mL). A  negative result must be combined with clinical observations, patient history, and epidemiological information. The expected result is Negative.  Fact Sheet for Patients:  BloggerCourse.comhttps://www.fda.gov/media/152166/download  Fact Sheet for Healthcare Providers:  SeriousBroker.ithttps://www.fda.gov/media/152162/download  This test is no t yet approved or cleared by the [place] FDA and  has been authorized for detection and/or diagnosis of SARS-CoV-2 by FDA under an Emergency Use Authorization (EUA). This EUA will remain  in effect (meaning this test can be used) for the duration of the COVID-19 declaration under Section 564(b)(1) of the Act, 21 U.S.C.section 360bbb-3(b)(1), unless the authorization is terminated  or revoked sooner.       Influenza A by PCR NEGATIVE NEGATIVE Final   Influenza B by PCR NEGATIVE NEGATIVE Final    Comment: (NOTE) The Xpert Xpress SARS-CoV-2/FLU/RSV plus assay is intended as an aid in the diagnosis of influenza from Nasopharyngeal swab specimens and should not be used as a sole basis for treatment. Nasal washings and aspirates are unacceptable for Xpert Xpress SARS-CoV-2/FLU/RSV testing.  Fact Sheet for Patients: BloggerCourse.comhttps://www.fda.gov/media/152166/download  Fact Sheet for Healthcare Providers: SeriousBroker.ithttps://www.fda.gov/media/152162/download  This test is not yet approved or cleared by the [place] FDA and has been authorized for detection and/or diagnosis of SARS-CoV-2 by FDA under an Emergency Use Authorization (EUA). This EUA will remain in effect (meaning  this test can be used) for the duration of the COVID-19 declaration under Section 564(b)(1) of the Act, 21 U.S.C. section 360bbb-3(b)(1), unless the authorization is terminated or revoked.  Performed at [facility], [address], [place], [place] [address]    Today   Subjective    Vu Liebman today has no new concerns- No fever  Or chills   No Nausea, Vomiting or Diarrhea        Patient has been seen  and examined prior to discharge   Objective   Blood pressure (!) 105/50, pulse 73, temperature 98.1 F (36.7 C), resp. rate 20, height 5\' 11"  (1.803 m), weight 99.1 kg, SpO2 95 %.   Intake/Output Summary (Last 24 hours) at 01/11/2021 1432 Last data filed at 01/11/2021 0900 Gross per 24 hour  Intake 690 ml  Output 275 ml  Net 415 ml    Exam Gen:- Awake Alert,  In no apparent distress  HEENT:- [place].AT, No sclera icterus Neck-Supple Neck,No JVD,.  Lungs-  CTAB , fair symmetrical air movement CV- S1, S2 normal, regular  Abd-  +ve B.Sounds, Abd Soft, No tenderness,    Extremity/Skin:- No  edema, pedal pulses present  Psych-affect is appropriate, oriented x3  , no further confusional episodes Neuro--improved gait overall, no further new neuro deficits   Data Review   CBC w Diff:  Lab Results  Component Value Date   WBC 9.9 01/07/2021   HGB 15.1 01/07/2021   HGB 15.5 12/24/2020   HCT 46.8 01/07/2021   HCT 45.6 12/24/2020   PLT 213 01/07/2021   PLT 223 12/24/2020   LYMPHOPCT 39 01/07/2021   MONOPCT 9 01/07/2021   EOSPCT 3 01/07/2021   BASOPCT 1 01/07/2021    CMP:  Lab Results  Component Value Date   NA 138 01/07/2021   NA 140 12/24/2020   K 4.5 01/07/2021   CL 104 01/07/2021   CO2 24 01/07/2021   BUN 29 (H) 01/07/2021   BUN 25 12/24/2020   CREATININE 1.67 (H) 01/07/2021   PROT 7.5 01/07/2021   PROT 7.1 12/24/2020   ALBUMIN 4.0 01/07/2021   ALBUMIN 4.5 12/24/2020   BILITOT 0.4 01/07/2021   BILITOT 0.5 12/24/2020   ALKPHOS 48 01/07/2021   AST 20 01/07/2021   ALT 14 01/07/2021  .   Total Discharge time is about 33 minutes  01/09/2021 M.D on 01/11/2021 at 2:32 PM  Go to [email or web address] -  for contact info  [facility] - Office  [phone]

## 2021-01-11 NOTE — Discharge Instructions (Signed)
1)You are taking Plavix and Aspirin  which are blood thinners so Please Avoid ibuprofen/Advil/Aleve/Motrin/Goody Powders/Naproxen/BC powders/Meloxicam/Diclofenac/Indomethacin and other Nonsteroidal anti-inflammatory medications as these will make you more likely to bleed and can cause stomach ulcers, can also cause Kidney problems.   2)Please take Aspirin 81 mg daily along with Plavix 75 mg daily for 30 days then after that STOP the aspirin and continue ONLY Plavix 75 mg daily indefinitely--for stroke prevention  3)Please take Midodrine 10 mg tablet 3 times a day to to prevent your blood pressure dropping so low that you might pass out  4)Please follow-up with Neurologist Dr. Beryle Beams-- Phone: [phone], Address: [address], [place], [place] [address] in 4 to 6 weeks for recheck and reevaluation.  Please call to make appointment with him  5)Stop enalapril and Flomax as this can contribute to blood pressure drops which can make you more likely to fall

## 2021-01-11 NOTE — Care Management Important Message (Signed)
Important Message  Patient Details  Name: Shane Welch MRN: [id number] Date of Birth: [birth year]   Medicare Important Message Given:  Yes     Corey Harold 01/11/2021, 1:35 PM

## 2021-01-11 NOTE — Progress Notes (Signed)
Physical Therapy Treatment Patient Details Name: Shane Welch MRN: [id number] DOB: [birth year] Today's Date: 01/11/2021    History of Present Illness Shane Welch  is a 80 y.o. male, with history of type 2 diabetes mellitus, hyperlipidemia, essential hypertension, CAD, and more presents the ED with a chief complaint of fall.  Patient reports that around dinnertime he stood up to get something out of the pantry when he felt dizzy and fell backwards hitting his head on a cabinet.  He reports that he remembers the whole thing but he knows he felt very weak and thought he might blackout.  After hitting his head he has no changes in vision or hearing.  Prior to his fall he had no chest pain, palpitations.  He does report that prior to his fall he had dysarthria, and ataxia when walking to the pantry.  He thinks the whole thing may be lasted 5 minutes however his wife reported that it lasted longer.  Patient reports that this time he feels completely back to normal except for these tired.  Patient has never had symptoms like this before.  Patient has no other complaints at this time.    PT Comments    Patient agreeable to therapy, cooperative and follows directions consistently.  Patient demonstrates good return for sitting up at bedside, able to keep trunk in midline without loss of sitting balance, able to march in place at bedside with slow unsteady movement, tolerated ambulating in hallway without loss of balance, but required increased time and followed with wheelchair for safety.  Patient tolerated sitting up in chair after therapy with his spouse present in room - nursing staff aware.  Patient orthostatics as follows:  Supine: 163/76, sitting 127/99, standing 99/77, sitting after ambulation 121/75, sitting > 5 minutes later 116/62 - RN notified.  Patient will benefit from continued physical therapy in hospital and recommended venue below to increase strength, balance, endurance for safe ADLs and gait.    Follow Up Recommendations  SNF     Equipment Recommendations  Rolling walker with 5" wheels    Recommendations for Other Services       Precautions / Restrictions Precautions Precautions: Fall Restrictions Weight Bearing Restrictions: No    Mobility  Bed Mobility Overal bed mobility: Needs Assistance Bed Mobility: Supine to Sit     Supine to sit: Supervision     General bed mobility comments: slightly labored movement    Transfers Overall transfer level: Needs assistance Equipment used: 1 person hand held assist;None Transfers: Sit to/from [employer] Sit to Stand: Min guard Stand pivot transfers: Min guard;Min assist       General transfer comment: slow labored slightly unsteady movement without loss of balance  Ambulation/Gait Ambulation/Gait assistance: Min guard;Min assist Gait Distance (Feet): 65 Feet Assistive device: 1 person hand held assist;None Gait Pattern/deviations: Decreased step length - right;Decreased step length - left;Decreased stride length Gait velocity: decreased   General Gait Details: slow labored unsteady cadence with decreased step/stride length, no loss of balance, followed with w/c for safety, limited secondary to fatigue   Stairs             Wheelchair Mobility    Modified Rankin (Stroke Patients Only)       Balance Overall balance assessment: Needs assistance Sitting-balance support: Feet supported;No upper extremity supported Sitting balance-Leahy Scale: Good Sitting balance - Comments: seated at EOB   Standing balance support: During functional activity;No upper extremity supported Standing balance-Leahy Scale: Fair Standing balance comment: without AD,  requires occasional hand held assist [facility])                            Cognition Arousal/Alertness: Awake/alert Behavior During Therapy: WFL for tasks assessed/performed Overall Cognitive Status: Within Functional Limits for tasks  assessed                                        Exercises General Exercises - Lower Extremity Long Arc Quad: Seated;AROM;Strengthening;Both;15 reps Hip Flexion/Marching: Seated;AROM;Strengthening;Both;15 reps;Standing Toe Raises: Seated;AROM;Strengthening;Both;20 reps Heel Raises: Seated;AROM;Strengthening;Both;20 reps    General Comments        Pertinent Vitals/Pain Pain Assessment: No/denies pain    Home Living                      Prior Function            PT Goals (current goals can now be found in the care plan section) Acute Rehab PT Goals Patient Stated Goal: return home with family to assist PT Goal Formulation: With patient Time For Goal Achievement: 01/23/21 Potential to Achieve Goals: Good Progress towards PT goals: Progressing toward goals    Frequency    Min 5X/week      PT Plan Current plan remains appropriate    Co-evaluation              AM-PAC PT "6 Clicks" Mobility   Outcome Measure  Help needed turning from your back to your side while in a flat bed without using bedrails?: None Help needed moving from lying on your back to sitting on the side of a flat bed without using bedrails?: None Help needed moving to and from a bed to a chair (including a wheelchair)?: A Little Help needed standing up from a chair using your arms (e.g., wheelchair or bedside chair)?: A Lot Help needed to walk in hospital room?: A Lot Help needed climbing 3-5 steps with a railing? : A Lot 6 Click Score: 17    End of Session Equipment Utilized During Treatment: Gait belt Activity Tolerance: Patient tolerated treatment well;Patient limited by fatigue Patient left: in chair;with call bell/phone within reach;with chair alarm set;with family/visitor present Nurse Communication: Mobility status PT Visit Diagnosis: Unsteadiness on feet (R26.81);Other abnormalities of gait and mobility (R26.89);Muscle weakness (generalized) (M62.81)      Time: 8657-8469 PT Time Calculation (min) (ACUTE ONLY): 35 min  Charges:  $Gait Training: 8-22 mins $Therapeutic Exercise: 8-22 mins                     12:17 PM, 01/11/21 Ocie Bob, MPT Physical Therapist with [facility] [phone] office [phone] mobile phone

## 2021-01-11 NOTE — Progress Notes (Signed)
Pt has discharge orders, discharge teaching given to pt and wife bedside, no further questions at this time. Pt will be going home with wife. Pt wheeled down to front lobby via w/c.

## 2021-01-11 NOTE — Progress Notes (Signed)
Occupational Therapy Treatment Patient Details Name: Eldrige Pitkin MRN: [id number] DOB: 06-05-1941 Today's Date: 01/11/2021    History of present illness Summer Parthasarathy  is a 80 y.o. male, with history of type 2 diabetes mellitus, hyperlipidemia, essential hypertension, CAD, and more presents the ED with a chief complaint of fall.  Patient reports that around dinnertime he stood up to get something out of the pantry when he felt dizzy and fell backwards hitting his head on a cabinet.  He reports that he remembers the whole thing but he knows he felt very weak and thought he might blackout.  After hitting his head he has no changes in vision or hearing.  Prior to his fall he had no chest pain, palpitations.  He does report that prior to his fall he had dysarthria, and ataxia when walking to the pantry.  He thinks the whole thing may be lasted 5 minutes however his wife reported that it lasted longer.  Patient reports that this time he feels completely back to normal except for these tired.  Patient has never had symptoms like this before.  Patient has no other complaints at this time.   OT comments  Pt pleasant and agreeable to OT treatment. Pt noted to continue demonstrating orthostatic hypotension. Pt BP taken seated at EOB after taking a few steps in room with a BP of 106/63. After ~5 minutes of sitting pt BP was taken again at 110/63. Finally pt BP was taken standing at EOB at 80/61. Nursing was in the room and reported she would give pt blood pressure medications. Remainder of session was limited to EOB where pt was able brush teeth with SPV. Pt primarily limitations appear to be orthostatic hypotension at this time. Minimal deficit noted in functional internal rotation but 5/5 MMT for L shoulder flexion and elbow flexion. Noted to lack convergence during visual testing but was able to track moving objects in all fields well. Pt will benefit from continued OT services in the hospital setting and recommended  venue below to increase balance and toleration of change in positions for safe ADLs.    Follow Up Recommendations  SNF    Equipment Recommendations  None recommended by OT          Precautions / Restrictions Precautions Precautions: Fall Restrictions Weight Bearing Restrictions: No       Mobility Bed Mobility Overal bed mobility: Needs Assistance Bed Mobility: Supine to Sit     Supine to sit: Supervision          Transfers Overall transfer level: Needs assistance   Transfers: Sit to/from Stand Sit to Stand: Min guard         General transfer comment: Able to ambulate a few steps briefly before returning to bed to take BP. Min guard assist.    Balance Overall balance assessment: Needs assistance Sitting-balance support: Feet supported;No upper extremity supported Sitting balance-Leahy Scale: Good Sitting balance - Comments:  (Able to sit upright at EOB)   Standing balance support: During functional activity;No upper extremity supported Standing balance-Leahy Scale: Poor Standing balance comment: Poor to fair with ~4 steps and in static standing. Noted to sway postriorly and anteriorly when standing at EOB for ~ 1 to 2 minutes while BP was taken.                           ADL either performed or assessed with clinical judgement   ADL Overall ADL's : Needs assistance/impaired  Grooming: Oral care;Sitting;Supervision/safety Grooming Details (indicate cue type and reason): Completed brushing teeth at EOB due to significant decrease in BP when standing.         Upper Body Dressing : Supervision/safety Upper Body Dressing Details (indicate cue type and reason): completed at bed level                         Vision   Vision Assessment?: Yes Tracking/Visual Pursuits: Able to track stimulus in all quads without difficulty Convergence: Impaired (comment) (No noted convergence.) Visual Fields: No apparent deficits               Cognition Arousal/Alertness: Awake/alert Behavior During Therapy: WFL for tasks assessed/performed Overall Cognitive Status: Within Functional Limits for tasks assessed                                                            Pertinent Vitals/ Pain       Pain Assessment: No/denies pain                                                          Frequency  Min 2X/week        Progress Toward Goals  OT Goals(current goals can now be found in the care plan section)  Progress towards OT goals: Progressing toward goals  Acute Rehab OT Goals Patient Stated Goal: return home with family to assist OT Goal Formulation: With patient Time For Goal Achievement: 01/22/21 Potential to Achieve Goals: Good ADL Goals Pt Will Perform Grooming: standing;with modified independence Pt Will Transfer to Toilet: stand pivot transfer;with modified independence  Plan Discharge plan remains appropriate                                    End of Session    OT Visit Diagnosis: Unsteadiness on feet (R26.81);Other abnormalities of gait and mobility (R26.89);Dizziness and giddiness (R42)   Activity Tolerance Other (comment) (Limited by orthostatic hypotension; limited to bedside seated tasks.)   Patient Left in bed;with call bell/phone within reach;with family/visitor present   Nurse Communication Other (comment) (Nursing present in room partially during standing BP. Nurse informed of significant drop in BP when pt standing.)        Time: 7902-4097 OT Time Calculation (min): 22 min  Charges: OT General Charges $OT Visit: 1 Visit OT Treatments $Self Care/Home Management : 8-22 mins  Jerline Linzy OT, MOT    Danie Chandler 01/11/2021, 9:53 AM

## 2021-02-19 ENCOUNTER — Ambulatory Visit: Payer: Medicare PPO | Admitting: Neurology

## 2021-02-19 ENCOUNTER — Other Ambulatory Visit: Payer: Self-pay

## 2021-02-19 ENCOUNTER — Encounter: Payer: Self-pay | Admitting: Neurology

## 2021-02-19 VITALS — BP 146/95 | HR 82 | Ht 71.0 in | Wt 212.4 lb

## 2021-02-19 DIAGNOSIS — I63531 Cerebral infarction due to unspecified occlusion or stenosis of right posterior cerebral artery: Secondary | ICD-10-CM

## 2021-02-19 DIAGNOSIS — G3184 Mild cognitive impairment, so stated: Secondary | ICD-10-CM | POA: Diagnosis not present

## 2021-02-19 DIAGNOSIS — I639 Cerebral infarction, unspecified: Secondary | ICD-10-CM

## 2021-02-19 DIAGNOSIS — R413 Other amnesia: Secondary | ICD-10-CM

## 2021-02-19 MED ORDER — MIDODRINE HCL 2.5 MG PO TABS: 2.5000 mg | ORAL_TABLET | Freq: Three times a day (TID) | ORAL | 0 refills | Status: DC

## 2021-02-19 NOTE — Progress Notes (Signed)
[facility] [address] [place]. [place] [address] [phone]       OFFICE CONSULT NOTE  [name] Date of Birth:  [birth year] Medical Record Number:  [id number]   Referring MD: Windle Guard Reason for Referral: Stroke HPI: [name] is a pleasant 89 Caucasian male seen today for initial office consultation visit for stroke.  He is accompanied by his wife [name] and history is obtained from them and review of electronic medical records and I personally reviewed available imaging films in PACS.  He has past medical history of diabetes, hypertension, hyperlipidemia and coronary artery disease.  He was seen on 01/07/2021 at [facility]  He was sitting on a stool in his kitchen table when if fell down.  He is unable to get up and was off balance.  He was also confused and disoriented.  He stated he was seeing black spots in front of both eyes.  He was brought in by EMS and was quite agitated when he came in.  Patient states he has no memory of his 5-day hospitalization.  He was seen on arrival by telemetry neurologist who thought he had a TIA and NIH stroke scale on admission was 0.  CT scan of the head on admission was unremarkable and subsequent hospital admission and MRI and further work-up was suggested.  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  2D echo showed normal ejection fraction without cardiac source of embolism.  LDL cholesterol was 1 1 3  mg percent.  Hemoglobin A1c was 6.8.  Urine drug screen was negative.  Patient was discharged on aspirin and Plavix for 3 weeks and subsequently Plavix alone.  Patient states he still sees occasional black spots in front of his eyes but his vision is mostly improved.  He is also improved in  his coordination and balance.  He however has noticed worsening of his baseline memory and cognitive difficulties which he was actually having for the last 5 6 years ago and before his stroke.  He also complains of feeling tired and having low energy.  Patient was started on Lipitor but his primary care physician has changed him to Crestor because of urinary retention.  Patient does have family history of Alzheimer's in his mother and worries about it.  He is tolerating Plavix well without bruising or bleeding.  His blood pressure is well controlled today slightly elevated in office at 146/95.  He was having some orthostatic dizziness but he has been started on midodrine which seems to have helped.  He has no prior history of strokes TIA seizures prior neurological problems.  He denies any history of atrial fibrillation, cardiac arrhythmias but does have history of coronary artery disease and is undergone stenting by Dr. .  ROS:   14 system review of systems is positive for vision difficulty, confusion, disorientation, memory loss, decreased energy, fatigue, tiredness all other systems negative  PMH:  Past Medical History:  Diagnosis Date  . Abnormal echocardiogram 03/11/2016  . Acute MI anterior wall first episode care [facility])   . CAD (coronary artery disease) 12/10/2015  . CAD (coronary artery disease), native coronary artery   . Diabetes (HCC) 12/10/2015  . Essential hypertension 09/03/2017  . Hyperlipidemia   . 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  Stroke (HCC) 01/07/2021  . Type II diabetes mellitus (HCC)    a. Dx ~ 2010.    Social History:  Social History   Socioeconomic History  . Marital status: Married    Spouse name: [name]  . Number of children: Not on file  . Years of education: Not on file  . Highest education level: Not on file  Occupational History  . Not on file  Tobacco Use  . Smoking status: Former Smoker    Packs/day: 1.00    Years: 30.00    Pack years:  30.00    Types: Cigarettes  . Smokeless tobacco: Never Used  . Tobacco comment: quit 1993  Vaping Use  . Vaping Use: Never used  Substance and Sexual Activity  . Alcohol use: Yes    Alcohol/week: 0.0 standard drinks    Comment: rare drink  . Drug use: No  . Sexual activity: Not on file  Other Topics Concern  . Not on file  Social History Narrative   Lives with wife and son   Right Handed   Drinks 2-3 cups caffeine daily   Social Determinants of Health   Financial Resource Strain: Not on file  Food Insecurity: Not on file  Transportation Needs: Not on file  Physical Activity: Not on file  Stress: Not on file  Social Connections: Not on file  Intimate Partner Violence: Not on file    Medications:   Current Outpatient Medications on File Prior to Visit  Medication Sig Dispense Refill  . acetaminophen (TYLENOL) 325 MG tablet Take 650 mg by mouth every 6 (six) hours as needed for mild pain, moderate pain or headache.    . clopidogrel (PLAVIX) 75 MG tablet Take 1 tablet (75 mg total) by mouth daily. Please take Aspirin 81 mg daily along with Plavix 75 mg daily for 30 days then after that STOP the aspirin and continue ONLY Plavix 75 mg daily indefinitely--for stroke prevention 30 tablet 11  . glipiZIDE (GLUCOTROL XL) 5 MG 24 hr tablet Take 5 mg by mouth daily with breakfast.    . metFORMIN (GLUCOPHAGE) 1000 MG tablet Take 1 tablet (1,000 mg total) by mouth 2 (two) times daily with a meal. 60 tablet 3  . midodrine (PROAMATINE) 10 MG tablet Take 1 tablet (10 mg total) by mouth 3 (three) times daily with meals. (Patient taking differently: Take 2.5 mg by mouth 3 (three) times daily with meals.) 90 tablet 3  . rosuvastatin (CRESTOR) 10 MG tablet Take 10 mg by mouth daily.    Marland Kitchen senna-docusate (SENOKOT-S) 8.6-50 MG tablet Take 2 tablets by mouth at bedtime. 90 tablet 3  . aspirin 81 MG EC tablet Take 1 tablet (81 mg total) by mouth daily. Swallow whole. 1)Please take Aspirin 81 mg daily  along with Plavix 75 mg daily for 30 days then after that STOP the aspirin and continue ONLY Plavix 75 mg daily indefinitely--for stroke prevention 30 tablet 0  . atorvastatin (LIPITOR) 40 MG tablet Take 1 tablet (40 mg total) by mouth every evening. 30 tablet 5   No current facility-administered medications on file prior to visit.    Allergies:   Allergies  Allergen Reactions  . Sulfa Antibiotics Hives    Physical Exam General: well developed, well nourished elderly Caucasian male, seated, in no evident distress Head: head normocephalic and atraumatic.   Neck: supple with no carotid or supraclavicular bruits Cardiovascular: regular rate and rhythm, no murmurs Musculoskeletal: no deformity Skin:  no rash/petichiae Vascular:  Normal  pulses all extremities  Neurologic Exam Mental Status: Awake and fully alert. Oriented to place and time. Recent and remote memory diminished. Attention span, concentration and fund of knowledge appropriate. Mood and affect appropriate.  Recall 3/3.  Able to name 10 animals which can walk on 4 legs.  Clock drawing 4/4. Cranial Nerves: Fundoscopic exam reveals sharp disc margins. Pupils equal, briskly reactive to light. Extraocular movements full without nystagmus. Visual fields full to confrontation. Hearing diminished bilaterally. Facial sensation intact. Face, tongue, palate moves normally and symmetrically.  Motor: Normal bulk and tone. Normal strength in all tested extremity muscles. Sensory.: intact to touch , pinprick , position and vibratory sensation.  Coordination: Rapid alternating movements normal in all extremities. Finger-to-nose and heel-to-shin performed accurately bilaterally. Gait and Station: Arises from chair without difficulty. Stance is normal. Gait demonstrates normal stride length and balance . Able to heel, toe and tandem walk with slight difficulty.  Reflexes: 1+ and symmetric except both ankle jerks are depressed slightly.. Toes  downgoing.   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  Vascular risk factors of coronary artery disease, diabetes, hypertension, hyperlipidemia     PLAN: I had a long d/w patient about his recent stroke, risk for recurrent stroke/TIAs, personally independently reviewed imaging studies and stroke evaluation results and answered questions.Continue aspirin 81 mg daily and Plavix 75 mg daily for secondary stroke prevention given history of drug-coated stents and maintain strict control of hypertension with blood pressure goal below 130/90, diabetes with hemoglobin A1c goal below 6.5% and lipids with LDL cholesterol goal below 70 mg/dL. I also advised the patient to eat a healthy diet with plenty of whole grains, cereals, fruits and vegetables, exercise regularly and maintain ideal body weight.  Check loop recorder for paroxysmal A. fib.  Check memory panel labs and EEG for his mild cognitive impairment.  We also discussed memory compensation strategies and advised him to increase participation in cognitively challenging activities like solving crossword puzzles, playing bridge and sodoku.  Followup in the future with me in 2 months or call earlier if necessary.  Greater than 50% time during this 55-minute consultation visit was spent on counseling and coordination of care about his embolic occipital stroke as well as discussion about mild cognitive impairment and answering questions. Delia Heady, MD Note: This document was prepared with digital dictation and possible smart phrase technology. Any transcriptional errors that result from this process are unintentional.

## 2021-02-19 NOTE — Patient Instructions (Signed)
I had a long d/w patient about his recent stroke, risk for recurrent stroke/TIAs, personally independently reviewed imaging studies and stroke evaluation results and answered questions.Continue Plavix 75 mg daily for secondary stroke prevention given history of drug-coated stents and maintain strict control of hypertension with blood pressure goal below 130/90, diabetes with hemoglobin A1c goal below 6.5% and lipids with LDL cholesterol goal below 70 mg/dL. I also advised the patient to eat a healthy diet with plenty of whole grains, cereals, fruits and vegetables, exercise regularly and maintain ideal body weight.  Check loop recorder for paroxysmal A. fib.  Check memory panel labs and EEG for his mild cognitive impairment.  We also discussed memory compensation strategies and advised him to increase participation in cognitively challenging activities like solving crossword puzzles, playing bridge and sodoku.  Followup in the future with me in 2 months or call earlier if necessary. Memory Compensation Strategies  1. Use "WARM" strategy.  W= write it down  A= associate it  R= repeat it  M= make a mental note  2.   You can keep a Glass blower/designer.  Use a 3-ring notebook with sections for the following: calendar, important names and phone numbers,  medications, doctors' names/phone numbers, lists/reminders, and a section to journal what you did  each day.   3.    Use a calendar to write appointments down.  4.    Write yourself a schedule for the day.  This can be placed on the calendar or in a separate section of the Memory Notebook.  Keeping a  regular schedule can help memory.  5.    Use medication organizer with sections for each day or morning/evening pills.  You may need help loading it  6.    Keep a basket, or pegboard by the door.  Place items that you need to take out with you in the basket or on the pegboard.  You may also want to  include a message board for reminders.  7.    Use sticky  notes.  Place sticky notes with reminders in a place where the task is performed.  For example: " turn off the  stove" placed by the stove, "lock the door" placed on the door at eye level, " take your medications" on  the bathroom mirror or by the place where you normally take your medications.  8.    Use alarms/timers.  Use while cooking to remind yourself to check on food or as a reminder to take your medicine, or as a  reminder to make a call, or as a reminder to perform another task, etc.  Stroke Prevention Some medical conditions and behaviors are associated with a higher chance of having a stroke. You can help prevent a stroke by making nutrition, lifestyle, and other changes, including managing any medical conditions you may have. What nutrition changes can be made?  Eat healthy foods. You can do this by: ? Choosing foods high in fiber, such as fresh fruits and vegetables and whole grains. ? Eating at least 5 or more servings of fruits and vegetables a day. Try to fill half of your plate at each meal with fruits and vegetables. ? Choosing lean protein foods, such as lean cuts of meat, poultry without skin, fish, tofu, beans, and nuts. ? Eating low-fat dairy products. ? Avoiding foods that are high in salt (sodium). This can help lower blood pressure. ? Avoiding foods that have saturated fat, trans fat, and cholesterol. This can help  prevent high cholesterol. ? Avoiding processed and premade foods.  Follow your health care provider's specific guidelines for losing weight, controlling high blood pressure (hypertension), lowering high cholesterol, and managing diabetes. These may include: ? Reducing your daily calorie intake. ? Limiting your daily sodium intake to 1,500 milligrams (mg). ? Using only healthy fats for cooking, such as olive oil, canola oil, or sunflower oil. ? Counting your daily carbohydrate intake.   What lifestyle changes can be made?  Maintain a healthy weight. Talk  to your health care provider about your ideal weight.  Get at least 30 minutes of moderate physical activity at least 5 days a week. Moderate activity includes brisk walking, biking, and swimming.  Do not use any products that contain nicotine or tobacco, such as cigarettes and e-cigarettes. If you need help quitting, ask your health care provider. It may also be helpful to avoid exposure to secondhand smoke.  Limit alcohol intake to no more than 1 drink a day for nonpregnant women and 2 drinks a day for men. One drink equals 12 oz of beer, 5 oz of wine, or 1 oz of hard liquor.  Stop any illegal drug use.  Avoid taking birth control pills. Talk to your health care provider about the risks of taking birth control pills if: ? You are over 61 years old. ? You smoke. ? You get migraines. ? You have ever had a blood clot. What other changes can be made?  Manage your cholesterol levels. ? Eating a healthy diet is important for preventing high cholesterol. If cholesterol cannot be managed through diet alone, you may also need to take medicines. ? Take any prescribed medicines to control your cholesterol as told by your health care provider.  Manage your diabetes. ? Eating a healthy diet and exercising regularly are important parts of managing your blood sugar. If your blood sugar cannot be managed through diet and exercise, you may need to take medicines. ? Take any prescribed medicines to control your diabetes as told by your health care provider.  Control your hypertension. ? To reduce your risk of stroke, try to keep your blood pressure below 130/80. ? Eating a healthy diet and exercising regularly are an important part of controlling your blood pressure. If your blood pressure cannot be managed through diet and exercise, you may need to take medicines. ? Take any prescribed medicines to control hypertension as told by your health care provider. ? Ask your health care provider if you should  monitor your blood pressure at home. ? Have your blood pressure checked every year, even if your blood pressure is normal. Blood pressure increases with age and some medical conditions.  Get evaluated for sleep disorders (sleep apnea). Talk to your health care provider about getting a sleep evaluation if you snore a lot or have excessive sleepiness.  Take over-the-counter and prescription medicines only as told by your health care provider. Aspirin or blood thinners (antiplatelets or anticoagulants) may be recommended to reduce your risk of forming blood clots that can lead to stroke.  Make sure that any other medical conditions you have, such as atrial fibrillation or atherosclerosis, are managed. What are the warning signs of a stroke? The warning signs of a stroke can be easily remembered as BEFAST.  B is for balance. Signs include: ? Dizziness. ? Loss of balance or coordination. ? Sudden trouble walking.  E is for eyes. Signs include: ? A sudden change in vision. ? Trouble seeing.  F is  for face. Signs include: ? Sudden weakness or numbness of the face. ? The face or eyelid drooping to one side.  A is for arms. Signs include: ? Sudden weakness or numbness of the arm, usually on one side of the body.  S is for speech. Signs include: ? Trouble speaking (aphasia). ? Trouble understanding.  T is for time. ? These symptoms may represent a serious problem that is an emergency. Do not wait to see if the symptoms will go away. Get medical help right away. Call your local emergency services (911 in the [place] Do not drive yourself to the hospital.  Other signs of stroke may include: ? A sudden, severe headache with no known cause. ? Nausea or vomiting. ? Seizure. Where to find more information For more information, visit:  American Stroke Association: www.strokeassociation.org  National Stroke Association: www.stroke.org Summary  You can prevent a stroke by eating healthy,  exercising, not smoking, limiting alcohol intake, and managing any medical conditions you may have.  Do not use any products that contain nicotine or tobacco, such as cigarettes and e-cigarettes. If you need help quitting, ask your health care provider. It may also be helpful to avoid exposure to secondhand smoke.  Remember BEFAST for warning signs of stroke. Get help right away if you or a loved one has any of these signs. This information is not intended to replace advice given to you by your health care provider. Make sure you discuss any questions you have with your health care provider. Document Revised: 09/18/2017 Document Reviewed: 11/11/2016 Elsevier Patient Education  2021 [employer].

## 2021-02-20 LAB — DEMENTIA PANEL
Homocysteine: 13.4 umol/L (ref 0.0–19.2)
RPR Ser Ql: NONREACTIVE
TSH: 3.11 u[IU]/mL (ref 0.450–4.500)
Vitamin B-12: 376 pg/mL (ref 232–1245)

## 2021-02-22 NOTE — Progress Notes (Signed)
Kindly inform the patient that lab work for reversible causes of memory loss was all satisfactory

## 2021-02-25 ENCOUNTER — Encounter: Payer: Self-pay | Admitting: Neurology

## 2021-02-25 ENCOUNTER — Telehealth: Payer: Self-pay | Admitting: Neurology

## 2021-02-25 NOTE — Telephone Encounter (Signed)
-----   Message from Micki Riley, MD sent at 02/22/2021  9:41 AM EDT ----- Joneen Roach inform the patient that lab work for reversible causes of memory loss was all satisfactory

## 2021-02-25 NOTE — Telephone Encounter (Signed)
This encounter was created in error - please disregard.

## 2021-02-25 NOTE — Telephone Encounter (Signed)
Pt's wife, Arin Peral (on [place]) called, relayed previous note. Ms. Geni Bers expressed understood.

## 2021-02-25 NOTE — Telephone Encounter (Signed)
-----   Message from Pramod S Sethi, MD sent at 02/22/2021  9:41 AM EDT ----- Kindly inform the patient that lab work for reversible causes of memory loss was all satisfactory 

## 2021-02-25 NOTE — Telephone Encounter (Signed)
Called the pt to review the lab results from memory work up completed by Dr Pearlean Brownie. There was no answer. LVM asking for the pt to call back.   If pt returns call please advise the patient that Per Dr. Pearlean Brownie the lab results were in normal range and there was nothing that appeared concerning.

## 2021-03-06 ENCOUNTER — Ambulatory Visit: Payer: Medicare PPO | Admitting: Neurology

## 2021-03-06 DIAGNOSIS — R413 Other amnesia: Secondary | ICD-10-CM

## 2021-03-06 DIAGNOSIS — R410 Disorientation, unspecified: Secondary | ICD-10-CM | POA: Diagnosis not present

## 2021-03-07 ENCOUNTER — Ambulatory Visit: Payer: Medicare PPO | Admitting: Cardiology

## 2021-03-07 ENCOUNTER — Other Ambulatory Visit: Payer: Self-pay

## 2021-03-07 ENCOUNTER — Encounter: Payer: Self-pay | Admitting: Cardiology

## 2021-03-07 VITALS — BP 120/56 | HR 92 | Ht 71.0 in | Wt 211.0 lb

## 2021-03-07 DIAGNOSIS — I639 Cerebral infarction, unspecified: Secondary | ICD-10-CM | POA: Diagnosis not present

## 2021-03-07 DIAGNOSIS — I25118 Atherosclerotic heart disease of native coronary artery with other forms of angina pectoris: Secondary | ICD-10-CM | POA: Diagnosis not present

## 2021-03-07 NOTE — Progress Notes (Signed)
Electrophysiology Office Note   Date:  03/07/2021   ID:  Shane Welch, DOB [birth year], MRN [id number]  PCP:  Kaleen Mask, MD  Cardiologist:   Primary Electrophysiologist:  Angelo Caroll Jorja Loa, MD    Chief Complaint: CVA   History of Present Illness: Shane Welch is a 80 y.o. male who is being seen today for the evaluation of CVA at the request of Micki Riley, MD. Presenting today for electrophysiology evaluation.  He has a history significant for type 2 diabetes, hypertension, hyperlipidemia, coronary artery disease.  He presented the emergency room with a fall.  He was found to have had an acute right occipital stroke.  He was put on aspirin and Plavix as well as Lipitor.  He presents today for possible Linq monitor implant.  Today, he denies symptoms of palpitations, chest pain, shortness of breath, orthopnea, PND, lower extremity edema, claudication, dizziness, presyncope, syncope, bleeding, or neurologic sequela. The patient is tolerating medications without difficulties.  Since his stroke, he is continued to have balance issues.  He also has memory issues.  Despite that, these have been improving.  He has not had a fall in the last week.  He is interested in further cardiac monitoring to look for atrial fibrillation.   Past Medical History:  Diagnosis Date  . Abnormal echocardiogram 03/11/2016  . Acute MI anterior wall first episode care [facility])   . CAD (coronary artery disease) 12/10/2015  . CAD (coronary artery disease), native coronary artery   . Diabetes (HCC) 12/10/2015  . Essential hypertension 09/03/2017  . Hyperlipidemia   . 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 Stroke (HCC) 01/07/2021  . Type II diabetes mellitus (HCC)    a. Dx ~ 2010.   Past Surgical History:  Procedure Laterality Date  . CARDIAC CATHETERIZATION N/A 12/08/2015   Procedure: Left Heart Cath and Coronary Angiography;  Surgeon: Corky Crafts, MD;  Location: [facility] INVASIVE CV LAB;   Service: Cardiovascular;  Laterality: N/A;  . CARDIAC CATHETERIZATION  12/08/2015   Procedure: Coronary Stent Intervention;  Surgeon: Corky Crafts, MD;  Location: [facility] INVASIVE CV LAB;  Service: Cardiovascular;;  . CERVICAL SPINE SURGERY     a. ~ 2002.  . Right Knee Surgery     a. 08/2015.     Current Outpatient Medications  Medication Sig Dispense Refill  . acetaminophen (TYLENOL) 325 MG tablet Take 650 mg by mouth every 6 (six) hours as needed for mild pain, moderate pain or headache.    Marland Kitchen atorvastatin (LIPITOR) 40 MG tablet Take 1 tablet (40 mg total) by mouth every evening. 30 tablet 5  . clopidogrel (PLAVIX) 75 MG tablet Take 1 tablet (75 mg total) by mouth daily. Please take Aspirin 81 mg daily along with Plavix 75 mg daily for 30 days then after that STOP the aspirin and continue ONLY Plavix 75 mg daily indefinitely--for stroke prevention 30 tablet 11  . finasteride (PROSCAR) 5 MG tablet Take 1 tablet by mouth daily.    Marland Kitchen glipiZIDE (GLUCOTROL XL) 5 MG 24 hr tablet Take 5 mg by mouth daily with breakfast.    . metFORMIN (GLUCOPHAGE) 1000 MG tablet Take 1 tablet (1,000 mg total) by mouth 2 (two) times daily with a meal. 60 tablet 3  . midodrine (PROAMATINE) 2.5 MG tablet Take 1 tablet (2.5 mg total) by mouth 3 (three) times daily with meals. 30 tablet 0  . rosuvastatin (CRESTOR) 10 MG tablet Take 10 mg by mouth daily.    Marland Kitchen  senna-docusate (SENOKOT-S) 8.6-50 MG tablet Take 2 tablets by mouth at bedtime. 90 tablet 3  . tamsulosin (FLOMAX) 0.4 MG CAPS capsule      No current facility-administered medications for this visit.    Allergies:   Sulfa antibiotics   Social History:  The patient  reports that he has quit smoking. His smoking use included cigarettes. He has a 30.00 pack-year smoking history. He has never used smokeless tobacco. He reports current alcohol use. He reports that he does not use drugs.   Family History:  The patient's family history includes Cancer in his  father; Dementia in his mother.    ROS:  Please see the history of present illness.   Otherwise, review of systems is positive for none.   All other systems are reviewed and negative.    PHYSICAL EXAM: VS:  BP (!) 120/56   Pulse 92   Ht 5\' 11"  (1.803 m)   Wt 166 lb (75.3 kg)   SpO2 97%   BMI 23.15 kg/m  , BMI Body mass index is 23.15 kg/m. GEN: Well nourished, well developed, in no acute distress  HEENT: normal  Neck: no JVD, carotid bruits, or masses Cardiac: RRR; no murmurs, rubs, or gallops,no edema  Respiratory:  clear to auscultation bilaterally, normal work of breathing GI: soft, nontender, nondistended, + BS MS: no deformity or atrophy  Skin: warm and dry Neuro:  Strength and sensation are intact Psych: euthymic mood, full affect  EKG:  EKG is ordered today. Personal review of the ekg ordered shows sinus rhythm, rate 92, multifocal PVCs  Recent Labs: 01/07/2021: ALT 14; BUN 29; Creatinine, Ser 1.67; Hemoglobin 15.1; Platelets 213; Potassium 4.5; Sodium 138 02/19/2021: TSH 3.110    Lipid Panel     Component Value Date/Time   CHOL 178 01/08/2021 0630   CHOL 230 (H) 12/24/2020 1006   TRIG 167 (H) 01/08/2021 0630   HDL 32 (L) 01/08/2021 0630   HDL 41 12/24/2020 1006   CHOLHDL 5.6 01/08/2021 0630   VLDL 33 01/08/2021 0630   LDLCALC 113 (H) 01/08/2021 0630   LDLCALC 156 (H) 12/24/2020 1006     Wt Readings from Last 3 Encounters:  03/07/21 166 lb (75.3 kg)  02/19/21 212 lb 6.4 oz (96.3 kg)  01/08/21 218 lb 7.6 oz (99.1 kg)      Other studies Reviewed: Additional studies/ records that were reviewed today include: TTE 01/08/21  Review of the above records today demonstrates:  1. Left ventricular ejection fraction, by estimation, is 50 to 55%. The  left ventricle has normal function. The left ventricle has no regional  wall motion abnormalities. There is mild left ventricular hypertrophy.  Left ventricular diastolic parameters  are indeterminate.  2. Right  ventricular systolic function is normal. The right ventricular  size is normal.  3. The mitral valve is normal in structure. No evidence of mitral valve  regurgitation. No evidence of mitral stenosis.  4. The aortic valve is tricuspid. Aortic valve regurgitation is not  visualized. No aortic stenosis is present.  5. Aortic dilatation noted. There is mild dilatation of the aortic root,  measuring 42 mm.  6. The inferior vena cava is normal in size with greater than 50%  respiratory variability, suggesting right atrial pressure of 3 mmHg.    ASSESSMENT AND PLAN:  1.  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  At this point, no cause of his stroke has been discovered.  He has had a full work-up by neurology.  Due to that, Linq monitor implant would be reasonable to monitor for atrial fibrillation.  Should atrial fibrillation be discovered, he would be a good candidate for anticoagulation.  Risk and benefits of been discussed.  Risk of bleeding and infection.  The patient understands these risks and has agreed to the procedure.  Case discussed with referring neurologist  Current medicines are reviewed at length with the patient today.   The patient does not have concerns regarding his medicines.  The following changes were made today:  none  Labs/ tests ordered today include:  Orders Placed This Encounter  Procedures  . EKG 12-Lead     Disposition:   FU with Tinia Oravec pending link monitor results  Signed, Penina Reisner Jorja Loa, MD  03/07/2021 8:37 AM     [facility] [address] [place] [address] [phone] (office) [phone] (fax)

## 2021-03-07 NOTE — Patient Instructions (Signed)
Medication Instructions:  Your physician recommends that you continue on your current medications as directed. Please refer to the Current Medication list given to you today.  *If you need a refill on your cardiac medications before your next appointment, please call your pharmacy*   Lab Work: none If you have labs (blood work) drawn today and your tests are completely normal, you will receive your results only by: Marland Kitchen MyChart Message (if you have MyChart) OR . A paper copy in the mail If you have any lab test that is abnormal or we need to change your treatment, we will call you to review the results.   Testing/Procedures: none   Follow-Up: At [facility], you and your health needs are our priority.  As part of our continuing mission to provide you with exceptional heart care, we have created designated Provider Care Teams.  These Care Teams include your primary Cardiologist (physician) and Advanced Practice Providers (APPs -  Physician Assistants and Nurse Practitioners) who all work together to provide you with the care you need, when you need it.  We recommend signing up for the patient portal called "MyChart".  Sign up information is provided on this After Visit Summary.  MyChart is used to connect with patients for Virtual Visits (Telemedicine).  Patients are able to view lab/test results, encounter notes, upcoming appointments, etc.  Non-urgent messages can be sent to your provider as well.   To learn more about what you can do with MyChart, go to [email or web address].    Your next appointment:   3 week(s)  The format for your next appointment:   In Person  Provider:   Loman Brooklyn, MD   Other Instructions  Implantable Loop Recorder Placement  An implantable loop recorder is a small electronic device that is placed under the skin of your chest. The device records the electrical activity of your heart over a long period of time. Your health care provider can download  these recordings to monitor your heart. You may need an implantable loop recorder if you have periods of abnormal heart activity (arrhythmias) or unexplained fainting (syncope). The recorder can be left in place for 1 year or longer. Tell a health care provider about:  Any allergies you have.  All medicines you are taking, including vitamins, herbs, eye drops, creams, and over-the-counter medicines.  Any problems you or family members have had with anesthetic medicines.  Any blood disorders you have.  Any surgeries you have had.  Any medical conditions you have.  Whether you are pregnant or may be pregnant. What are the risks? Generally, this is a safe procedure. However, problems may occur, including:  Infection.  Bleeding.  Allergic reactions to anesthetic medicines.  Damage to nerves or blood vessels.  Failure of the device to work. This could require another surgery to replace it. What happens before the procedure?  You may have a physical exam, blood tests, and imaging tests of your heart, such as a chest X-ray.  Follow instructions from your health care provider about eating or drinking restrictions.  Ask your health care provider about: ? Changing or stopping your regular medicines. This is especially important if you are taking diabetes medicines or blood thinners. ? Taking medicines such as aspirin and ibuprofen. These medicines can thin your blood. Do not take these medicines unless your health care provider tells you to take them. ? Taking over-the-counter medicines, vitamins, herbs, and supplements.  Ask your health care provider how your surgical site will be  marked or identified.  Ask your health care provider what steps will be taken to help prevent infection. These may include: ? Removing hair at the surgery site. ? Washing skin with a germ-killing soap.  Plan to have someone take you home from the hospital or clinic.  Plan to have a responsible adult  care for you for at least 24 hours after you leave the hospital or clinic. This is important.  Do not use any products that contain nicotine or tobacco, such as cigarettes and e-cigarettes. If you need help quitting, ask your health care provider.   What happens during the procedure?  An IV will be inserted into one of your veins.  You may be given one or more of the following: ? A medicine to help you relax (sedative). ? A medicine to numb the area (local anesthetic).  A small incision will be made on the left side of your upper chest.  A pocket will be created under your skin.  The device will be placed in the pocket.  The incision will be closed with stitches (sutures) or adhesive strips.  A bandage (dressing) will be placed over the incision. The procedure may vary among health care providers and hospitals. What happens after the procedure?  Your blood pressure, heart rate, breathing rate, and blood oxygen level will be monitored until you leave the hospital or clinic.  You may be able to go home on the day of your surgery. Before you go home: ? Your health care provider will program your recorder. ? You will learn how to trigger your device with a handheld activator. ? You will learn how to send recordings to your health care provider. ? You will get an ID card for your device, and you will be told when to use it.  Do not drive for 24 hours if you were given a sedative during your procedure. Summary  An implantable loop recorder is a small electronic device that is placed under the skin of your chest to monitor your heart over a long period of time.  The recorder can be left in place for 1 year or longer.  Plan to have someone take you home from the hospital or clinic. This information is not intended to replace advice given to you by your health care provider. Make sure you discuss any questions you have with your health care provider. Document Revised: 06/26/2020  Document Reviewed: 11/21/2017 Elsevier Patient Education  2021 [employer].

## 2021-03-08 ENCOUNTER — Ambulatory Visit: Payer: Medicare PPO | Admitting: Interventional Cardiology

## 2021-03-08 NOTE — Progress Notes (Signed)
Kindly inform the patient that EEG study showed slight slowing of brainwave activities throughout on both sides which is a nonspecific finding seen in patients with memory loss and seizures but no definite ongoing seizure activity was noted

## 2021-03-11 ENCOUNTER — Telehealth: Payer: Self-pay | Admitting: Emergency Medicine

## 2021-03-11 NOTE — Telephone Encounter (Signed)
LVM for patient to return call  If patient returns call, please inform him of Dr. Marlis Edelson findings of EEG:        Kindly inform the patient that EEG study showed slight slowing of brainwave activities throughout on both sides which is a nonspecific finding seen in patients with memory loss and seizures but no definite ongoing seizure activity was noted  Thank you, Cicero Duck.

## 2021-03-11 NOTE — Telephone Encounter (Signed)
-----   Message from Micki Riley, MD sent at 03/08/2021  9:01 AM EDT ----- Shane Welch inform the patient that EEG study showed slight slowing of brainwave activities throughout on both sides which is a nonspecific finding seen in patients with memory loss and seizures but no definite ongoing seizure activity was noted

## 2021-03-12 NOTE — Telephone Encounter (Signed)
Called and discussed with patient's Dr. Marlis Edelson review and findings regarding EEG Study.  Patient denied further questions, verbalized understanding and expressed appreciation for the phone call.

## 2021-03-21 NOTE — Telephone Encounter (Signed)
Pt's wife is asking for a call from RN with an explanation of the EEG results, she said pt does not recall what he was told by her.  Please call wife at [phone]

## 2021-03-25 NOTE — Telephone Encounter (Signed)
Called wife back at number provided.  Discussed Dr. Marlis Edelson findings regarding EEG study on patient.  Patient denied further questions, verbalized understanding and expressed appreciation for the phone call.

## 2021-03-29 ENCOUNTER — Encounter: Payer: Self-pay | Admitting: Cardiology

## 2021-03-29 ENCOUNTER — Ambulatory Visit: Payer: Medicare PPO | Admitting: Cardiology

## 2021-03-29 ENCOUNTER — Other Ambulatory Visit: Payer: Self-pay

## 2021-03-29 VITALS — BP 132/68 | HR 85 | Ht 71.0 in | Wt 205.8 lb

## 2021-03-29 DIAGNOSIS — I639 Cerebral infarction, unspecified: Secondary | ICD-10-CM

## 2021-03-29 NOTE — Progress Notes (Signed)
Electrophysiology Office Note   Date:  03/29/2021   ID:  Shane Welch, DOB 21-Nov-1940, MRN [id number]  PCP:  Kaleen Mask, MD  Cardiologist:   Primary Electrophysiologist:  Sofhia Ulibarri Jorja Loa, MD    Chief Complaint: CVA   History of Present Illness: Shane Welch is a 80 y.o. male who is being seen today for the evaluation of CVA at the request of Kaleen Mask, *. Presenting today for electrophysiology evaluation.  History significant for type 2 diabetes, hypertension, hyperlipidemia, coronary artery disease.  Presented emergency room with a fall and was found to have an acute right occipital lobe stroke.  He was placed on aspirin and Plavix as well as Lipitor.  Today, denies symptoms of palpitations, chest pain, shortness of breath, orthopnea, PND, lower extremity edema, claudication, dizziness, presyncope, syncope, bleeding, or neurologic sequela. The patient is tolerating medications without difficulties.     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 Dx ~ 2010.   Past Surgical History:  Procedure Laterality Date   CARDIAC CATHETERIZATION N/A 12/08/2015   Procedure: Left Heart Cath and Coronary Angiography;  Surgeon: Corky Crafts, MD;  Location: [facility] INVASIVE CV LAB;  Service: Cardiovascular;  Laterality: N/A;   CARDIAC CATHETERIZATION  12/08/2015   Procedure: Coronary Stent Intervention;  Surgeon: Corky Crafts, MD;  Location: [facility] INVASIVE CV LAB;  Service: Cardiovascular;;   CERVICAL SPINE SURGERY     a. ~ 2002.   Right Knee Surgery     a. 08/2015.     Current Outpatient  Medications  Medication Sig Dispense Refill   acetaminophen (TYLENOL) 325 MG tablet Take 650 mg by mouth every 6 (six) hours as needed for mild pain, moderate pain or headache.     atorvastatin (LIPITOR) 40 MG tablet Take 1 tablet (40 mg total) by mouth every evening. 30 tablet 5   clopidogrel (PLAVIX) 75 MG tablet Take 1 tablet (75 mg total) by mouth daily. Please take Aspirin 81 mg daily along with Plavix 75 mg daily for 30 days then after that STOP the aspirin and continue ONLY Plavix 75 mg daily indefinitely--for stroke prevention 30 tablet 11   finasteride (PROSCAR) 5 MG tablet Take 1 tablet by mouth daily.     glipiZIDE (GLUCOTROL XL) 5 MG 24 hr tablet Take 5 mg by mouth daily with breakfast.     metFORMIN (GLUCOPHAGE) 1000 MG tablet Take 1 tablet (1,000 mg total) by mouth 2 (two) times daily with a meal. 60 tablet 3   midodrine (PROAMATINE) 2.5 MG tablet Take 1 tablet (2.5 mg total) by mouth 3 (three) times daily with meals. 30 tablet 0   rosuvastatin (CRESTOR) 10 MG tablet Take 10 mg by mouth daily.     senna-docusate (SENOKOT-S) 8.6-50 MG tablet Take 2 tablets by mouth at bedtime. 90 tablet 3   tamsulosin (FLOMAX) 0.4 MG CAPS capsule      No current facility-administered medications for this visit.    Allergies:   Sulfa antibiotics   Social History:  The patient  reports that he has quit smoking. His smoking use included cigarettes.  He has a 30.00 pack-year smoking history. He has never used smokeless tobacco. He reports current alcohol use. He reports that he does not use drugs.   Family History:  The patient's family history includes Cancer in his father; Dementia in his mother.   ROS:  Please see the history of present illness.   Otherwise, review of systems is positive for none.   All other systems are reviewed and negative.   PHYSICAL EXAM: VS:  There were no vitals taken for this visit. , BMI There is no height or weight on file to calculate BMI. GEN: Well nourished, well  developed, in no acute distress  HEENT: normal  Neck: no JVD, carotid bruits, or masses Cardiac: RRR; no murmurs, rubs, or gallops,no edema  Respiratory:  clear to auscultation bilaterally, normal work of breathing GI: soft, nontender, nondistended, + BS MS: no deformity or atrophy  Skin: warm and dry Neuro:  Strength and sensation are intact Psych: euthymic mood, full affect  EKG:  EKG is not ordered today. Personal review of the ekg ordered 03/07/21 shows sinus rhythm, PVCs   Recent Labs: 01/07/2021: ALT 14; BUN 29; Creatinine, Ser 1.67; Hemoglobin 15.1; Platelets 213; Potassium 4.5; Sodium 138 02/19/2021: TSH 3.110    Lipid Panel     Component Value Date/Time   CHOL 178 01/08/2021 0630   CHOL 230 (H) 12/24/2020 1006   TRIG 167 (H) 01/08/2021 0630   HDL 32 (L) 01/08/2021 0630   HDL 41 12/24/2020 1006   CHOLHDL 5.6 01/08/2021 0630   VLDL 33 01/08/2021 0630   LDLCALC 113 (H) 01/08/2021 0630   LDLCALC 156 (H) 12/24/2020 1006     Wt Readings from Last 3 Encounters:  03/07/21 211 lb (95.7 kg)  02/19/21 212 lb 6.4 oz (96.3 kg)  01/08/21 218 lb 7.6 oz (99.1 kg)      Other studies Reviewed: Additional studies/ records that were reviewed today include: TTE 01/08/21  Review of the above records today demonstrates:   1. Left ventricular ejection fraction, by estimation, is 50 to 55%. The  left ventricle has normal function. The left ventricle has no regional  wall motion abnormalities. There is mild left ventricular hypertrophy.  Left ventricular diastolic parameters  are indeterminate.   2. Right ventricular systolic function is normal. The right ventricular  size is normal.   3. The mitral valve is normal in structure. No evidence of mitral valve  regurgitation. No evidence of mitral stenosis.   4. The aortic valve is tricuspid. Aortic valve regurgitation is not  visualized. No aortic stenosis is present.   5. Aortic dilatation noted. There is mild dilatation of the aortic  root,  measuring 42 mm.   6. The inferior vena cava is normal in size with greater than 50%  respiratory variability, suggesting right atrial pressure of 3 mmHg.    ASSESSMENT AND PLAN:  1.  Cryptogenic stroke: At this point, no cause has been found.  He would benefit from Linq monitor implant.  Risk and benefits of been discussed.  Risk include bleeding and infection.  He understands these risks and has agreed to the procedure.    Case discussed with referring neurologist  Current medicines are reviewed at length with the patient today.   The patient does not have concerns regarding his medicines.  The following changes were made today:  non  Labs/ tests ordered today include:  No orders of the defined types were placed in this encounter.    Disposition:  FU with Amire Gossen pending link monitor results  Signed, Rosela Supak Jorja Loa, MD  03/29/2021 2:14 PM     [facility] [address] [place] [address] [phone] (office) [phone] (fax)  SURGEON:  Loman Brooklyn, MD     PREPROCEDURE DIAGNOSIS:  Cryptogenic Stroke    POSTPROCEDURE DIAGNOSIS:  Cryptogenic Stroke     PROCEDURES:   1. Implantable loop recorder implantation    INTRODUCTION:  Shane Welch is a 80 y.o. male with a history of unexplained stroke who presents today for implantable loop implantation.  The patient has had a cryptogenic stroke.  Despite an extensive workup by neurology, no reversible causes have been identified.  he has worn telemetry during which he did not have arrhythmias.  There is significant concern for possible atrial fibrillation as the cause for the patients stroke.  The patient therefore presents today for implantable loop implantation.     DESCRIPTION OF PROCEDURE:  Informed written consent was obtained, and the patient was brought to the electrophysiology lab in a fasting state.  The patient required no sedation for the procedure today.  Mapping over the  patient's chest was performed by the EP lab staff to identify the area where electrograms were most prominent for ILR recording.  This area was found to be the left parasternal region over the 3rd-4th intercostal space. The patients left chest was therefore prepped and draped in the usual sterile fashion by the EP lab staff. The skin overlying the left parasternal region was infiltrated with lidocaine for local analgesia.  A 0.5-cm incision was made over the left parasternal region over the 3rd intercostal space.  A subcutaneous ILR pocket was fashioned using a combination of sharp and blunt dissection.  A Medtronic Reveal Linq model [place] [id number] implantable loop recorder was then placed into the pocket  R waves were very prominent and measured 0.74mV. EBL<1 ml.  Steri- Strips and a sterile dressing were then applied.  There were no early apparent complications.     CONCLUSIONS:   1. Successful implantation of a Medtronic Reveal LINQ implantable loop recorder for cryptogenic stroke  2. No early apparent complications.

## 2021-05-03 ENCOUNTER — Other Ambulatory Visit: Payer: Self-pay | Admitting: Family Medicine

## 2021-05-03 DIAGNOSIS — R1032 Left lower quadrant pain: Secondary | ICD-10-CM

## 2021-05-06 ENCOUNTER — Ambulatory Visit (INDEPENDENT_AMBULATORY_CARE_PROVIDER_SITE_OTHER): Payer: Medicare PPO

## 2021-05-06 ENCOUNTER — Other Ambulatory Visit (HOSPITAL_COMMUNITY): Payer: Self-pay | Admitting: Family Medicine

## 2021-05-06 ENCOUNTER — Other Ambulatory Visit: Payer: Self-pay | Admitting: Family Medicine

## 2021-05-06 DIAGNOSIS — R413 Other amnesia: Secondary | ICD-10-CM

## 2021-05-06 DIAGNOSIS — I639 Cerebral infarction, unspecified: Secondary | ICD-10-CM | POA: Diagnosis not present

## 2021-05-06 DIAGNOSIS — R1032 Left lower quadrant pain: Secondary | ICD-10-CM

## 2021-05-06 DIAGNOSIS — Z8673 Personal history of transient ischemic attack (TIA), and cerebral infarction without residual deficits: Secondary | ICD-10-CM

## 2021-05-07 LAB — CUP PACEART REMOTE DEVICE CHECK
Date Time Interrogation Session: 20220718212132
Implantable Pulse Generator Implant Date: 20220610

## 2021-05-09 ENCOUNTER — Other Ambulatory Visit: Payer: Medicare PPO

## 2021-05-12 NOTE — Progress Notes (Signed)
Cardiology Office Note   Date:  05/13/2021   ID:  Shane Welch, DOB [birth year], MRN [id number]  PCP:  Kaleen Mask, MD    No chief complaint on file.  CAD  Wt Readings from Last 3 Encounters:  05/13/21 200 lb (90.7 kg)  03/29/21 205 lb 12.8 oz (93.4 kg)  03/07/21 211 lb (95.7 kg)       History of Present Illness: Shane Welch is a 80 y.o. 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 He underwent cardiac cath and a drug-eluting stent was placed in the mid LAD.  His ejection fraction was 40-45% with anterior hypokinesis of the time.   Repeat echo in 6/17 showed normal LV function.   He had a trip to [place] a few years ago.   In 2019, he reported: "he never got his stamina back since his MI. Can only mow 1/2 his lawn before he gets tired and has to rest.  This is about the same since his MI.  Has not gotten any worse."   It was also noted with [name] in 2019: "Patient never started Plavix last year and is only taken an aspirin once a day.  Discussed with Dr. Eldridge Dace who does recommend Plavix over aspirin.  Patient does not want to change his medications and states he is not a medication taker.  He will remain on aspirin alone."  In 12/2020: "Short term memory issues.  Feels this is getting worse.  He wants to avoid statins and as many meds as possible."  Had a stroke in March 2022.  He continues to feel fatigued.  He has not been walking. Normal ILR as of July 2022.     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 Dx ~ 2010.    Past Surgical History:  Procedure Laterality Date   CARDIAC  CATHETERIZATION N/A 12/08/2015   Procedure: Left Heart Cath and Coronary Angiography;  Surgeon: Corky Crafts, MD;  Location: [facility] INVASIVE CV LAB;  Service: Cardiovascular;  Laterality: N/A;   CARDIAC CATHETERIZATION  12/08/2015   Procedure: Coronary Stent Intervention;  Surgeon: Corky Crafts, MD;  Location[facility] INVASIVE CV LAB;  Service: Cardiovascular;;   CERVICAL SPINE SURGERY     a. ~ 2002.   Right Knee Surgery     a. 08/2015.     Current Outpatient Medications  Medication Sig Dispense Refill   acetaminophen (TYLENOL) 325 MG tablet Take 650 mg by mouth every 6 (six) hours as needed for mild pain, moderate pain or headache.     clopidogrel (PLAVIX) 75 MG tablet Take 1 tablet (75 mg total) by mouth daily. Please take Aspirin 81 mg daily along with Plavix 75 mg daily for 30 days then after that STOP the aspirin and continue ONLY Plavix 75 mg daily indefinitely--for stroke prevention 30 tablet 11   finasteride (PROSCAR) 5 MG tablet Take 1 tablet by mouth daily.     glipiZIDE (GLUCOTROL XL) 5 MG 24 hr tablet Take 5 mg by mouth daily with breakfast.     metFORMIN (GLUCOPHAGE) 1000 MG tablet  Take 1 tablet (1,000 mg total) by mouth 2 (two) times daily with a meal. 60 tablet 3   midodrine (PROAMATINE) 2.5 MG tablet Take 1 tablet (2.5 mg total) by mouth 3 (three) times daily with meals. 30 tablet 0   rosuvastatin (CRESTOR) 20 MG tablet Take 1 tablet (20 mg total) by mouth daily. 90 tablet 3   tamsulosin (FLOMAX) 0.4 MG CAPS capsule      No current facility-administered medications for this visit.    Allergies:   Sulfa antibiotics    Social History:  The patient  reports that he has quit smoking. His smoking use included cigarettes. He has a 30.00 pack-year smoking history. He has never used smokeless tobacco. He reports current alcohol use. He reports that he does not use drugs.   Family History:  The patient's family history includes Cancer in his father; Dementia in his mother.     ROS:  Please see the history of present illness.   Otherwise, review of systems are positive for lack of energy and memory problems.   All other systems are reviewed and negative.    PHYSICAL EXAM: VS:  BP 104/70   Pulse 65   Ht 5\' 11"  (1.803 m)   Wt 200 lb (90.7 kg)   SpO2 96%   BMI 27.89 kg/m  , BMI Body mass index is 27.89 kg/m. GEN: Well nourished, well developed, in no acute distress HEENT: normal Neck: no JVD, carotid bruits, or masses Cardiac: RRR, premature beats; no murmurs, rubs, or gallops,no edema  Respiratory:  clear to auscultation bilaterally, normal work of breathing GI: soft, nontender, nondistended, + BS MS: no deformity or atrophy Skin: warm and dry, no rash Neuro:  Strength and sensation are intact Psych: euthymic mood, full affect   EKG:   The ekg ordered today demonstrates NSR, PVCs   Recent Labs: 01/07/2021: ALT 14; BUN 29; Creatinine, Ser 1.67; Hemoglobin 15.1; Platelets 213; Potassium 4.5; Sodium 138 02/19/2021: TSH 3.110   Lipid Panel    Component Value Date/Time   CHOL 178 01/08/2021 0630   CHOL 230 (H) 12/24/2020 1006   TRIG 167 (H) 01/08/2021 0630   HDL 32 (L) 01/08/2021 0630   HDL 41 12/24/2020 1006   CHOLHDL 5.6 01/08/2021 0630   VLDL 33 01/08/2021 0630   LDLCALC 113 (H) 01/08/2021 0630   LDLCALC 156 (H) 12/24/2020 1006     Other studies Reviewed: Additional studies/ records that were reviewed today with results demonstrating: LDL 113 in 12/2020.   ASSESSMENT AND PLAN:  CAD/Old MI : No angina. COntinue aggressive secondary prevention.  Normal LVEF on 3/22 echo.   Hyperlipidemia: LDL above target.  Increase rosuvastatin to 20 mg daily.  HTN: The current medical regimen is effective;  continue present plan and medications. Family reports memory issues.  WIll inform Dr.  4/22 that he will try PT if it is still recommended. DM: A1C 6.8 in 12/2020. FH AAA: Increasing Crestor. WOuld like to see LDL closer to 70.  Prior tobacco  abuse: quit many years ago   Current medicines are reviewed at length with the patient today.  The patient concerns regarding his medicines were addressed.  The following changes have been made:  increase Crestor  Labs/ tests ordered today include: liver, BMet and lipids in 3 months  Orders Placed This Encounter  Procedures   EKG 12-Lead    Recommend 150 minutes/week of aerobic exercise Low fat, low carb, high fiber diet recommended  Disposition:   FU in  6 months   Signed, Lance Muss, MD  05/13/2021 2:34 PM    [facility] [address] [place], [place], [place]  [address] Phone: [phone]; Fax: [phone]

## 2021-05-13 ENCOUNTER — Other Ambulatory Visit: Payer: Self-pay

## 2021-05-13 ENCOUNTER — Encounter (INDEPENDENT_AMBULATORY_CARE_PROVIDER_SITE_OTHER): Payer: Self-pay

## 2021-05-13 ENCOUNTER — Encounter: Payer: Self-pay | Admitting: Interventional Cardiology

## 2021-05-13 ENCOUNTER — Ambulatory Visit: Payer: Medicare PPO | Admitting: Interventional Cardiology

## 2021-05-13 VITALS — BP 104/70 | HR 65 | Ht 71.0 in | Wt 200.0 lb

## 2021-05-13 DIAGNOSIS — E1159 Type 2 diabetes mellitus with other circulatory complications: Secondary | ICD-10-CM

## 2021-05-13 DIAGNOSIS — E782 Mixed hyperlipidemia: Secondary | ICD-10-CM

## 2021-05-13 DIAGNOSIS — I25118 Atherosclerotic heart disease of native coronary artery with other forms of angina pectoris: Secondary | ICD-10-CM

## 2021-05-13 DIAGNOSIS — I10 Essential (primary) hypertension: Secondary | ICD-10-CM | POA: Diagnosis not present

## 2021-05-13 DIAGNOSIS — I639 Cerebral infarction, unspecified: Secondary | ICD-10-CM

## 2021-05-13 MED ORDER — ROSUVASTATIN CALCIUM 20 MG PO TABS: 20.0000 mg | ORAL_TABLET | Freq: Every day | ORAL | 3 refills | Status: DC

## 2021-05-13 NOTE — Patient Instructions (Addendum)
Medication Instructions:  Your physician has recommended you make the following change in your medication: Increase Rosuvastatin to 20 mg by mouth daily   *If you need a refill on your cardiac medications before your next appointment, please call your pharmacy*   Lab Work: Have fasting lab work done on October 25,2022 at [facility].  CMET and Lipid If you have labs (blood work) drawn today and your tests are completely normal, you will receive your results only by: MyChart Message (if you have MyChart) OR A paper copy in the mail If you have any lab test that is abnormal or we need to change your treatment, we will call you to review the results.   Testing/Procedures: none   Follow-Up: At [facility], you and your health needs are our priority.  As part of our continuing mission to provide you with exceptional heart care, we have created designated Provider Care Teams.  These Care Teams include your primary Cardiologist (physician) and Advanced Practice Providers (APPs -  Physician Assistants and Nurse Practitioners) who all work together to provide you with the care you need, when you need it.  We recommend signing up for the patient portal called "MyChart".  Sign up information is provided on this After Visit Summary.  MyChart is used to connect with patients for Virtual Visits (Telemedicine).  Patients are able to view lab/test results, encounter notes, upcoming appointments, etc.  Non-urgent messages can be sent to your provider as well.   To learn more about what you can do with MyChart, go to [email or web address].    Your next appointment:   November 18, 2021 at 11:00  The format for your next appointment:   In Person  Provider:   Everette Rank, MD   Other Instructions

## 2021-05-28 ENCOUNTER — Ambulatory Visit (HOSPITAL_COMMUNITY)
Admission: RE | Admit: 2021-05-28 | Discharge: 2021-05-28 | Disposition: A | Discharge status: Home / Self Care | Payer: Medicare PPO | Source: Ambulatory Visit | Attending: Family Medicine | Admitting: Family Medicine

## 2021-05-28 ENCOUNTER — Other Ambulatory Visit: Payer: Self-pay

## 2021-05-28 DIAGNOSIS — Z8673 Personal history of transient ischemic attack (TIA), and cerebral infarction without residual deficits: Secondary | ICD-10-CM | POA: Insufficient documentation

## 2021-05-28 DIAGNOSIS — R1032 Left lower quadrant pain: Secondary | ICD-10-CM

## 2021-05-28 DIAGNOSIS — R413 Other amnesia: Secondary | ICD-10-CM | POA: Insufficient documentation

## 2021-05-28 LAB — POCT I-STAT CREATININE: Creatinine, Ser: 1.5 mg/dL — ABNORMAL HIGH (ref 0.61–1.24)

## 2021-05-28 MED ORDER — IOHEXOL 350 MG/ML SOLN
85.0000 mL | Freq: Once | INTRAVENOUS | Status: AC | PRN
  Administered 2021-05-28: 85 mL via INTRAVENOUS

## 2021-05-28 NOTE — Progress Notes (Signed)
Carelink Summary Report / Loop Recorder 

## 2021-05-29 ENCOUNTER — Telehealth: Payer: Self-pay

## 2021-05-29 NOTE — Telephone Encounter (Signed)
LINQ alert received.  1 tachy event, duration 23sec, rate 250 EGM with noisy baseline, periods that appear to be SVT.  Patient reports he was having and MRI scan yesterday during this time and the facility experienced a power outage. Patient denies any symptoms. Patient was advised by MRI staff he was supposed to call to let us know incase anything showed up. Advised patient nothing he needs to do at this time.   Consulted with [employer] Kathlene November), advised this is common when patient having MRI and will not affect the device from further monitoring.

## 2021-05-30 ENCOUNTER — Ambulatory Visit: Payer: Medicare PPO | Admitting: Neurology

## 2021-06-03 NOTE — Progress Notes (Signed)
Cardiology Office Note    Date:  06/04/2021   ID:  Shane Welch, DOB 01/11/1941, MRN [id number]   PCP:  Kaleen Mask, MD   [facility]  Cardiologist:  Lance Muss, MD   Advanced Practice Provider:  No care team member to display Electrophysiologist:  None   [phone]   Chief Complaint  Patient presents with   Hospitalization Follow-up     History of Present Illness:  Shane Welch is a 80 y.o. male with history of CAD S/P AWMI 11/2015 with DES mLAD EF 40-45% at that time, f/u echo 03/2016 normal LVEF. Dr. Eldridge Dace recommended Plavix over ASA but patient declined and remained on ASA alone. CVA 12/2020. Normal ILR  Patient saw Dr. Eldridge Dace 04/2021 and increased his crestor to 20 mg daily.   Went to [facility] ED [facility]/01/21 with memory problems, balance issues. CT showed no acute change. All records reviewed and notes indicate HR was in the 40's at times so recommended he be seen. Labs reviewed and Crt 1.5, troponin negative, BNP 1626, CXR lower lobe opacities may represent chronic fibrosis/scarring. I can't view EKG but data shows HR 65, 69, 80.normal LINQ 05/06/21 and due for recheck 06/10/21.  Patient comes in with his wife. He and his wife say his HR would drop down to 40's when he stands up or just laying down. No energy. He's not doing anything at this time and refused PT because he thought he could do it on his own but he never did. His balance is bad   I spoke with device clinic and his LINQ is only set for tachycardia not bradycardia.  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  Dx ~ 2010.    Past Surgical History:  Procedure Laterality Date   CARDIAC CATHETERIZATION N/A 12/08/2015   Procedure: Left Heart Cath and Coronary Angiography;  Surgeon: Corky Crafts, MD;  [facility] INVASIVE CV LAB;  Service: Cardiovascular;  Laterality: N/A;   CARDIAC CATHETERIZATION  12/08/2015   Procedure: Coronary Stent Intervention;  Surgeon: Corky Crafts, MD;  Location: [facility] INVASIVE CV LAB;  Service: Cardiovascular;;   CERVICAL SPINE SURGERY     a. ~ 2002.   Right Knee Surgery     a. 08/2015.    Current Medications: Current Meds  Medication Sig   acetaminophen (TYLENOL) 325 MG tablet Take 650 mg by mouth every 6 (six) hours as needed for mild pain, moderate pain or headache.   clopidogrel (PLAVIX) 75 MG tablet Take 1 tablet (75 mg total) by mouth daily. Please take Aspirin 81 mg daily along with Plavix 75 mg daily for 30 days then after that STOP the aspirin and continue ONLY Plavix 75 mg daily indefinitely--for stroke prevention   finasteride (PROSCAR) 5 MG tablet Take 1 tablet by mouth daily.   glipiZIDE (GLUCOTROL XL) 5 MG 24 hr tablet Take 5 mg by mouth daily with breakfast.   metFORMIN (GLUCOPHAGE) 1000 MG tablet Take 1 tablet (1,000 mg total) by mouth 2 (two) times daily with a meal.   midodrine (PROAMATINE) 2.5 MG tablet Take  1 tablet (2.5 mg total) by mouth 3 (three) times daily with meals.   rosuvastatin (CRESTOR) 20 MG tablet Take 1 tablet (20 mg total) by mouth daily.   tamsulosin (FLOMAX) 0.4 MG CAPS capsule      Allergies:   Sulfa antibiotics   Social History   Socioeconomic History   Marital status: Married    Spouse name: [name]   Number of children: Not on file   Years of education: Not on file   Highest education level: Not on file  Occupational History   Not on file  Tobacco Use   Smoking status: Former    Packs/day: 1.00    Years: 30.00    Pack years: 30.00    Types: Cigarettes   Smokeless tobacco: Never   Tobacco comments:    quit  1993  Vaping Use   Vaping Use: Never used  Substance and Sexual Activity   Alcohol use: Yes    Alcohol/week: 0.0 standard drinks    Comment: rare drink   Drug use: No   Sexual activity: Not on file  Other Topics Concern   Not on file  Social History Narrative   Lives with wife and son   Right Handed   Drinks 2-3 cups caffeine daily   Social Determinants of Health   Financial Resource Strain: Not on file  Food Insecurity: Not on file  Transportation Needs: Not on file  Physical Activity: Not on file  Stress: Not on file  Social Connections: Not on file     Family History:  The patient's  family history includes Cancer in his father; Dementia in his mother.   ROS:   Please see the history of present illness.    ROS All other systems reviewed and are negative.   PHYSICAL EXAM:   VS:  BP 140/82   Pulse 85   Ht 5\' 11"  (1.803 m)   Wt 202 lb (91.6 kg)   SpO2 97%   BMI 28.17 kg/m   Physical Exam  GEN: Well nourished, well developed, in no acute distress  Neck: no JVD, carotid bruits, or masses Cardiac:RRR; no murmurs, rubs, or gallops  Respiratory:  clear to auscultation bilaterally, normal work of breathing GI: soft, nontender, nondistended, + BS Ext: without cyanosis, clubbing, or edema, Good distal pulses bilaterally MS: no deformity or atrophy  Skin: warm and dry, no rash Neuro:  Alert and Oriented x 3, Psych: euthymic mood, full affect  Wt Readings from Last 3 Encounters:  06/04/21 202 lb (91.6 kg)  05/13/21 200 lb (90.7 kg)  03/29/21 205 lb 12.8 oz (93.4 kg)      Studies/Labs Reviewed:   EKG:  EKG is not ordered today.     Recent Labs: 01/07/2021: ALT 14; BUN 29; Hemoglobin 15.1; Platelets 213; Potassium 4.5; Sodium 138 02/19/2021: TSH 3.110 05/28/2021: Creatinine, Ser 1.50   Lipid Panel    Component Value Date/Time   CHOL 178 01/08/2021 0630   CHOL 230 (H) 12/24/2020 1006   TRIG 167 (H) 01/08/2021 0630   HDL 32 (L) 01/08/2021 0630   HDL 41  12/24/2020 1006   CHOLHDL 5.6 01/08/2021 0630   VLDL 33 01/08/2021 0630   LDLCALC 113 (H) 01/08/2021 0630   LDLCALC 156 (H) 12/24/2020 1006    Additional studies/ records that were reviewed today include:  Echo 12/2020 IMPRESSIONS     1. Left ventricular ejection fraction, by estimation, is 50 to 55%. The  left ventricle has normal function. The left ventricle has  no regional  wall motion abnormalities. There is mild left ventricular hypertrophy.  Left ventricular diastolic parameters  are indeterminate.   2. Right ventricular systolic function is normal. The right ventricular  size is normal.   3. The mitral valve is normal in structure. No evidence of mitral valve  regurgitation. No evidence of mitral stenosis.   4. The aortic valve is tricuspid. Aortic valve regurgitation is not  visualized. No aortic stenosis is present.   5. Aortic dilatation noted. There is mild dilatation of the aortic root,  measuring 42 mm.   6. The inferior vena cava is normal in size with greater than 50%  respiratory variability, suggesting right atrial pressure of 3 mmHg.   FINDINGS   Left Ventricle: Left ventricular ejection fraction, by estimation, is 50  to 55%. The left ventricle has normal function. The left ventricle has no  regional wall motion abnormalities. Definity contrast agent was given IV  to delineate the left ventricular   endocardial borders. The left ventricular internal cavity size was normal  in size. There is mild left ventricular hypertrophy. Left ventricular  diastolic parameters are indeterminate.   Right Ventricle: The right ventricular size is normal. No increase in  right ventricular wall thickness. Right ventricular systolic function is  normal.   Left Atrium: Left atrial size was normal in size.   Right Atrium: Right atrial size was normal in size.   Pericardium: There is no evidence of pericardial effusion.   Mitral Valve: The mitral valve is normal in  structure. No evidence of  mitral valve regurgitation. No evidence of mitral valve stenosis.   Tricuspid Valve: The tricuspid valve is normal in structure. Tricuspid  valve regurgitation is not demonstrated. No evidence of tricuspid  stenosis.   Aortic Valve: The aortic valve is tricuspid. Aortic valve regurgitation is  not visualized. No aortic stenosis is present. Aortic valve mean gradient  measures 1.8 mmHg. Aortic valve peak gradient measures 3.5 mmHg. Aortic  valve area, by VTI measures 3.91  cm.   Pulmonic Valve: The pulmonic valve was not well visualized. Pulmonic valve  regurgitation is mild. No evidence of pulmonic stenosis.   Aorta: Aortic dilatation noted. There is mild dilatation of the aortic  root, measuring 42 mm.   Pulmonary Artery: Indeterminant PASP, inadequate TR jet.   Venous: The inferior vena cava is normal in size with greater than 50%  respiratory variability, suggesting right atrial pressure of 3 mmHg.   IAS/Shunts: No atrial level shunt detected by color flow Doppler.     LEFT VENTRICLE  PLAX 2D  LVIDd:         4.80 cm  Diastology  LVIDs:         3.50 cm  LV e' medial:    4.90 cm/s  LV PW:         1.30 cm  LV E/e' medial:  11.4  LV IVS:        1.20 cm  LV e' lateral:   6.74 cm/s  LVOT diam:     2.30 cm  LV E/e' lateral: 8.3  LV SV:         82  LV SV Index:   37  LVOT Area:     4.15 cm     RIGHT VENTRICLE  RV Basal diam:  3.20 cm  RV S prime:     12.80 cm/s  TAPSE (M-mode): 3.4 cm   LEFT ATRIUM  Index       RIGHT ATRIUM           Index  LA diam:        3.70 cm 1.69 cm/m  RA Area:     16.70 cm  LA Vol (A2C):   80.2 ml 36.63 ml/m RA Volume:   42.80 ml  19.55 ml/m  LA Vol (A4C):   57.8 ml 26.40 ml/m  LA Biplane Vol: 71.1 ml 32.48 ml/m   AORTIC VALVE  AV Area (Vmax):    3.90 cm  AV Area (Vmean):   4.02 cm  AV Area (VTI):     3.91 cm  AV Vmax:           94.05 cm/s  AV Vmean:          63.318 cm/s  AV VTI:             0.210 m  AV Peak Grad:      3.5 mmHg  AV Mean Grad:      1.8 mmHg  LVOT Vmax:         88.30 cm/s  LVOT Vmean:        61.300 cm/s  LVOT VTI:          0.197 m  LVOT/AV VTI ratio: 0.94    AORTA  Ao Root diam: 4.20 cm   MITRAL VALVE  MV Area (PHT): 3.48 cm    SHUNTS  MV Decel Time: 218 msec    Systemic VTI:  0.20 m  MV E velocity: 55.80 cm/s  Systemic Diam: 2.30 cm  MV A velocity: 97.40 cm/s  MV E/A ratio:  0.57   Dina RichJonathan Branch MD  Electronically signed by Dina RichJonathan Branch MD  Signature Date/Time: 01/08/2021/12:29:24 PM        Final     Risk Assessment/Calculations:         ASSESSMENT:    1. Coronary artery disease involving native coronary artery of native heart without angina pectoris   2. Sinus bradycardia   3. Essential hypertension   4. History of CVA (cerebrovascular accident)   5. 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 Elberta Fortisamnitz.  Currently his Linq status monitor is only set for tachycardias but they will set it to monitor bradycardia as well.  He also recommends a 3-day monitor to calculate PVC burden because he is having a lot of bigeminy.  We will place this today.  Follow-up with Dr. Elberta Fortisamnitz pending 3-day monitor.  HTN blood pressure controlled and now off all meds  CVA 12/2020 no arrhthymias on ILR so far, on Plavix.  Never did physical therapy and still having a lot of balance issues and falls.  Recommend he do physical therapy per Dr. Pearlean BrownieSethi and he has an appointment with him in in the next month  HLD-Crestor increased 20 mg daily by Dr. Eldridge DaceVaranasi 04/2021    Shared Decision Making/Informed Consent        Medication Adjustments/Labs and Tests Ordered: Current medicines are reviewed at length with the patient today.  Concerns  regarding medicines are outlined above.  Medication changes, Labs and Tests ordered today are listed in the Patient Instructions below. Patient Instructions  Medication Instructions:  Your physician recommends that you  continue on your current medications as directed. Please refer to the Current Medication list given to you today.  *If you need a refill on your cardiac medications before your next appointment, please call your pharmacy*   Lab Work: NONE   If you have labs (blood work) drawn today and your tests are completely normal, you will receive your results only by: MyChart Message (if you have MyChart) OR A paper copy in the mail If you have any lab test that is abnormal or we need to change your treatment, we will call you to review the results.   Testing/Procedures: Christena Deem- Long Term Monitor Instructions   Your physician has requested you wear your ZIO patch monitor___3 ___days.   This is a single patch monitor.  [employer] supplies one patch monitor per enrollment.  Additional stickers are not available.   Please do not apply patch if you will be having a Nuclear Stress Test, Echocardiogram, Cardiac CT, MRI, or Chest Xray during the time frame you would be wearing the monitor. The patch cannot be worn during these tests.  You cannot remove and re-apply the ZIO XT patch monitor.   Your ZIO patch monitor will be sent USPS Priority mail from [facility] directly to your home address. The monitor may also be mailed to a PO BOX if home delivery is not available.   It may take 3-5 days to receive your monitor after you have been enrolled.   Once you have received you monitor, please review enclosed instructions.  Your monitor has already been registered assigning a specific monitor serial # to you.   Applying the monitor   Shave hair from upper left chest.   Hold abrader disc by orange tab.  Rub abrader in 40 strokes over left upper chest as indicated in your monitor  instructions.   Clean area with 4 enclosed alcohol pads .  Use all pads to assure are is cleaned thoroughly.  Let dry.   Apply patch as indicated in monitor instructions.  Patch will be place under collarbone on left side of chest with arrow pointing upward.   Rub patch adhesive wings for 2 minutes.Remove white label marked "1".  Remove white label marked "2".  Rub patch adhesive wings for 2 additional minutes.   While looking in a mirror, press and release button in center of patch.  A small green light will flash 3-4 times .  This will be your only indicator the monitor has been turned on.     Do not shower for the first 24 hours.  You may shower after the first 24 hours.   Press button if you feel a symptom. You will hear a small click.  Record Date, Time and Symptom in the Patient Log Book.   When you are ready to remove patch, follow instructions on last 2 pages of Patient Log Book.  Stick patch monitor onto last page of Patient Log Book.   Place Patient Log Book in Hatch box.  Use locking tab on box and tape box closed securely.  The Orange and [employer] has [employer] on it.  Please place in mailbox as soon as possible.  Your physician should have your test results approximately 7 days after the monitor has been mailed back to [facility].   Call [facility] Customer Care at [phone] if you have questions regarding your ZIO XT patch monitor.  Call them immediately if you see an orange light blinking on your monitor.  If your monitor falls off in less than 4 days contact our Monitor department at [phone].  If your monitor becomes loose or falls off after 4 days call [employer] at [phone] for suggestions on securing your monitor.     Follow-Up: At [facility], you and your health needs are our priority.  As part of our continuing mission to provide you with exceptional heart care, we have created designated Provider Care Teams.  These Care Teams include your  primary Cardiologist (physician) and Advanced Practice Providers (APPs -  Physician Assistants and Nurse Practitioners) who all work together to provide you with the care you need, when you need it.  We recommend signing up for the patient portal called "MyChart".  Sign up information is provided on this After Visit Summary.  MyChart is used to connect with patients for Virtual Visits (Telemedicine).  Patients are able to view lab/test results, encounter notes, upcoming appointments, etc.  Non-urgent messages can be sent to your provider as well.   To learn more about what you can do with MyChart, go to [email or web address].    Your next appointment:    As Planned   The format for your next appointment:   In Person  Provider:   Eldridge Dace    Other Instructions Thank you for choosing [facility]!     Elson Clan, PA-C  06/04/2021 12:57 PM    [facility] [address] [place], [place], [place]  [address] Phone: [phone]; Fax: [phone]

## 2021-06-04 ENCOUNTER — Ambulatory Visit: Payer: Medicare PPO | Admitting: Physician Assistant

## 2021-06-04 ENCOUNTER — Encounter: Payer: Self-pay | Admitting: Physician Assistant

## 2021-06-04 ENCOUNTER — Ambulatory Visit (INDEPENDENT_AMBULATORY_CARE_PROVIDER_SITE_OTHER): Payer: Medicare PPO

## 2021-06-04 ENCOUNTER — Telehealth: Payer: Self-pay

## 2021-06-04 ENCOUNTER — Other Ambulatory Visit: Payer: Self-pay | Admitting: Physician Assistant

## 2021-06-04 ENCOUNTER — Other Ambulatory Visit: Payer: Self-pay

## 2021-06-04 VITALS — BP 140/82 | HR 85 | Ht 71.0 in | Wt 202.0 lb

## 2021-06-04 DIAGNOSIS — E7849 Other hyperlipidemia: Secondary | ICD-10-CM

## 2021-06-04 DIAGNOSIS — I493 Ventricular premature depolarization: Secondary | ICD-10-CM

## 2021-06-04 DIAGNOSIS — R001 Bradycardia, unspecified: Secondary | ICD-10-CM | POA: Diagnosis not present

## 2021-06-04 DIAGNOSIS — Z8673 Personal history of transient ischemic attack (TIA), and cerebral infarction without residual deficits: Secondary | ICD-10-CM | POA: Diagnosis not present

## 2021-06-04 DIAGNOSIS — I2510 Atherosclerotic heart disease of native coronary artery without angina pectoris: Secondary | ICD-10-CM | POA: Diagnosis not present

## 2021-06-04 DIAGNOSIS — I10 Essential (primary) hypertension: Secondary | ICD-10-CM | POA: Diagnosis not present

## 2021-06-04 NOTE — Patient Instructions (Signed)
Medication Instructions:  Your physician recommends that you continue on your current medications as directed. Please refer to the Current Medication list given to you today.  *If you need a refill on your cardiac medications before your next appointment, please call your pharmacy*   Lab Work: NONE   If you have labs (blood work) drawn today and your tests are completely normal, you will receive your results only by: MyChart Message (if you have MyChart) OR A paper copy in the mail If you have any lab test that is abnormal or we need to change your treatment, we will call you to review the results.   Testing/Procedures: Christena Deem- Long Term Monitor Instructions   Your physician has requested you wear your ZIO patch monitor___3 ___days.   This is a single patch monitor.  [employer] supplies one patch monitor per enrollment.  Additional stickers are not available.   Please do not apply patch if you will be having a Nuclear Stress Test, Echocardiogram, Cardiac CT, MRI, or Chest Xray during the time frame you would be wearing the monitor. The patch cannot be worn during these tests.  You cannot remove and re-apply the ZIO XT patch monitor.   Your ZIO patch monitor will be sent USPS Priority mail from [facility] directly to your home address. The monitor may also be mailed to a PO BOX if home delivery is not available.   It may take 3-5 days to receive your monitor after you have been enrolled.   Once you have received you monitor, please review enclosed instructions.  Your monitor has already been registered assigning a specific monitor serial # to you.   Applying the monitor   Shave hair from upper left chest.   Hold abrader disc by orange tab.  Rub abrader in 40 strokes over left upper chest as indicated in your monitor instructions.   Clean area with 4 enclosed alcohol pads .  Use all pads to assure are is cleaned thoroughly.  Let dry.   Apply patch as indicated in monitor  instructions.  Patch will be place under collarbone on left side of chest with arrow pointing upward.   Rub patch adhesive wings for 2 minutes.Remove white label marked "1".  Remove white label marked "2".  Rub patch adhesive wings for 2 additional minutes.   While looking in a mirror, press and release button in center of patch.  A small green light will flash 3-4 times .  This will be your only indicator the monitor has been turned on.     Do not shower for the first 24 hours.  You may shower after the first 24 hours.   Press button if you feel a symptom. You will hear a small click.  Record Date, Time and Symptom in the Patient Log Book.   When you are ready to remove patch, follow instructions on last 2 pages of Patient Log Book.  Stick patch monitor onto last page of Patient Log Book.   Place Patient Log Book in [place] box.  Use locking tab on box and tape box closed securely.  The [employer] and [employer] has [employer] on it.  Please place in mailbox as soon as possible.  Your physician should have your test results approximately 7 days after the monitor has been mailed back to [facility].   Call [facility] Customer Care at [phone] if you have questions regarding your ZIO XT patch monitor.  Call them immediately if you see an  orange light blinking on your monitor.   If your monitor falls off in less than 4 days contact our Monitor department at [phone].  If your monitor becomes loose or falls off after 4 days call [employer] at [phone] for suggestions on securing your monitor.     Follow-Up: At [facility], you and your health needs are our priority.  As part of our continuing mission to provide you with exceptional heart care, we have created designated Provider Care Teams.  These Care Teams include your primary Cardiologist (physician) and Advanced Practice Providers (APPs -  Physician Assistants and Nurse Practitioners) who all work together to provide you with  the care you need, when you need it.  We recommend signing up for the patient portal called "MyChart".  Sign up information is provided on this After Visit Summary.  MyChart is used to connect with patients for Virtual Visits (Telemedicine).  Patients are able to view lab/test results, encounter notes, upcoming appointments, etc.  Non-urgent messages can be sent to your provider as well.   To learn more about what you can do with MyChart, go to [email or web address].    Your next appointment:    As Planned   The format for your next appointment:   In Person  Provider:   Eldridge Dace    Other Instructions Thank you for choosing [facility]!

## 2021-06-04 NOTE — Telephone Encounter (Signed)
Incoming call from Jacolyn Reedy, [place] requesting review of most recent linq transmission to assess for possible brady episodes. Patient presented to [facility] ED 06/01/21 with chest pain and it is documented that patient was bradycardic. Huston Foley settings currently off on linq secondary to patient programmed for cryptogenic stroke settings. After discussion with Dr. Elberta Fortis, implanting physician, new orders received to program brady settings at <40 for 6. Also requested that PA order 3 day monitor for PVC burden as presenting rhythm on linq is bigeminy.

## 2021-06-06 ENCOUNTER — Ambulatory Visit (HOSPITAL_BASED_OUTPATIENT_CLINIC_OR_DEPARTMENT_OTHER): Payer: Medicare PPO | Admitting: Family

## 2021-06-10 ENCOUNTER — Ambulatory Visit (INDEPENDENT_AMBULATORY_CARE_PROVIDER_SITE_OTHER): Payer: Medicare PPO

## 2021-06-10 DIAGNOSIS — I639 Cerebral infarction, unspecified: Secondary | ICD-10-CM

## 2021-06-10 LAB — CUP PACEART REMOTE DEVICE CHECK
Date Time Interrogation Session: 20220820212226
Implantable Pulse Generator Implant Date: 20220610

## 2021-06-26 NOTE — Progress Notes (Signed)
Carelink Summary Report / Loop Recorder 

## 2021-06-28 NOTE — Addendum Note (Signed)
Encounter addended by: Novella Olive on: 06/28/2021 12:42 PM  Actions taken: Letter saved

## 2021-07-01 ENCOUNTER — Telehealth: Payer: Self-pay | Admitting: Cardiology

## 2021-07-01 NOTE — Telephone Encounter (Signed)
Spoke with Lorene Dy at Dr. Jeannetta Nap (PCP) office and she states pt was in their office today and HR on pulse ox was in the 40s x 2.  Pt was asymptomatic.  He was there to see them because home health PT called last week about pt complaining of increased fatigue and HR of 38.  Pt recently wore a monitor that was ordered at his appt with Jacolyn Reedy, PA-C on 8/16.  Family member has been keeping a log of HRs since 8/23 and they were consistently in the 40s.  Discussed briefly that these can be inaccurate if pt is having arrhythmias.  Advised I will send to Dr. Elberta Fortis and his nurse to review and follow up.  Pt has already left from PCP office.

## 2021-07-01 NOTE — Telephone Encounter (Signed)
Providers office is reaching out to get a briefing on pt who is currently there being seen for Bradycardia hr is only 40 today please advise

## 2021-07-08 NOTE — Telephone Encounter (Signed)
PT has not been contacted yet in regards to this issue.. please follow up as soon as possible

## 2021-07-09 ENCOUNTER — Ambulatory Visit: Payer: Medicare PPO | Admitting: Neurology

## 2021-07-09 ENCOUNTER — Encounter: Payer: Self-pay | Admitting: Neurology

## 2021-07-09 VITALS — BP 136/72 | HR 53 | Ht 71.0 in | Wt 202.0 lb

## 2021-07-09 DIAGNOSIS — R413 Other amnesia: Secondary | ICD-10-CM | POA: Diagnosis not present

## 2021-07-09 DIAGNOSIS — G3184 Mild cognitive impairment, so stated: Secondary | ICD-10-CM

## 2021-07-09 MED ORDER — DONEPEZIL HCL 10 MG PO TABS: 10.0000 mg | ORAL_TABLET | Freq: Every day | ORAL | 3 refills | Status: DC

## 2021-07-09 NOTE — Patient Instructions (Signed)
I had a long discussion with the patient and his wife regarding his memory loss and cognitive impairment poststroke and discussed results of lab work, EEG and answered questions.  I recommend a trial of Aricept 5 mg daily for a month increase if tolerated without side effects to 10 mg daily.  He was also increased to increase participation in cognitively challenging activities like solving crossword puzzles, playing bridge and sodoku.  We also discussed memory compensation strategies.  He will continue on Plavix for secondary stroke prevention and maintain aggressive risk factor modification with strict control of hypertension with blood pressure goal below 130/90, lipids with LDL cholesterol goal below 70 mg percent and diabetes with hemoglobin A1c goal below 6.5%.  He will return for follow-up in the future in 3 months or call earlier if necessary. Memory Compensation Strategies  Use "WARM" strategy.  W= write it down  A= associate it  R= repeat it  M= make a mental note  2.   You can keep a Glass blower/designer.  Use a 3-ring notebook with sections for the following: calendar, important names and phone numbers,  medications, doctors' names/phone numbers, lists/reminders, and a section to journal what you did  each day.   3.    Use a calendar to write appointments down.  4.    Write yourself a schedule for the day.  This can be placed on the calendar or in a separate section of the Memory Notebook.  Keeping a  regular schedule can help memory.  5.    Use medication organizer with sections for each day or morning/evening pills.  You may need help loading it  6.    Keep a basket, or pegboard by the door.  Place items that you need to take out with you in the basket or on the pegboard.  You may also want to  include a message board for reminders.  7.    Use sticky notes.  Place sticky notes with reminders in a place where the task is performed.  For example: " turn off the  stove" placed by the  stove, "lock the door" placed on the door at eye level, " take your medications" on  the bathroom mirror or by the place where you normally take your medications.  8.    Use alarms/timers.  Use while cooking to remind yourself to check on food or as a reminder to take your medicine, or as a  reminder to make a call, or as a reminder to perform another task, etc.

## 2021-07-10 DIAGNOSIS — R413 Other amnesia: Secondary | ICD-10-CM | POA: Insufficient documentation

## 2021-07-10 NOTE — Progress Notes (Signed)
[facility] [address] [place]. [place] [address] [phone]       OFFICE CONSULT NOTE  [name] Date of Birth:  December 25, 1940 Medical Record Number:  [id number]   Referring MD: Windle Guard Reason for Referral: Stroke HPI: Initial visit 02/19/2021: [name] is a pleasant 80 Caucasian male seen today for initial office consultation visit for stroke.  He is accompanied by his wife Shane Welch and history is obtained from them and review of electronic medical records and I personally reviewed available imaging films in PACS.  He has past medical history of diabetes, hypertension, hyperlipidemia and coronary artery disease.  He was seen on 01/07/2021 at [facility].  He was sitting on a stool in his kitchen table when if fell down.  He is unable to get up and was off balance.  He was also confused and disoriented.  He stated he was seeing black spots in front of both eyes.  He was brought in by EMS and was quite agitated when he came in.  Patient states he has no memory of his 5-day hospitalization.  He was seen on arrival by telemetry neurologist who thought he had a TIA and NIH stroke scale on admission was 0.  CT scan of the head on admission was unremarkable and subsequent hospital admission and MRI and further work-up was suggested.  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  2D echo showed normal ejection fraction without cardiac source of embolism.  LDL cholesterol was 1 1 3  mg percent.  Hemoglobin A1c was 6.8.  Urine drug screen was negative.  Patient was discharged on aspirin and Plavix for 3 weeks and subsequently Plavix alone.  Patient states he still sees occasional black spots in front of his eyes but his vision is mostly improved.   He is also improved in his coordination and balance.  He however has noticed worsening of his baseline memory and cognitive difficulties which he was actually having for the last 5 6 years ago and before his stroke.  He also complains of feeling tired and having low energy.  Patient was started on Lipitor but his primary care physician has changed him to Crestor because of urinary retention.  Patient does have family history of Alzheimer's in his mother and worries about it.  He is tolerating Plavix well without bruising or bleeding.  His blood pressure is well controlled today slightly elevated in office at 146/95.  He was having some orthostatic dizziness but he has been started on midodrine which seems to have helped.  He has no prior history of strokes TIA seizures prior neurological problems.  He denies any history of atrial fibrillation, cardiac arrhythmias but does have history of coronary artery disease and is undergone stenting by Dr. . Update 07/09/2021 : He returns for follow-up after last visit 4 months ago.  He is accompanied by his wife.  She states that his memory is not better.  He continues to have short-term memory difficulties and trouble remembering recent information.  He struggles with calculations and with numbers.  He still mostly independent but wife has to keep a close watch on him.  He denies any symptoms of delusion, hallucinations, unsafe behavior.  His gait and balance are good with no falls or  injuries.  He has been having some bradycardia and has an appointment upcoming to see cardiologist Dr. Elberta Fortis for this.  He has finished working with physical therapy for his balance.  He had lab work for reversible causes of memory loss on 02/19/2021 all of which were normal.  EEG on 03/08/2019 showed mild generalized slowing without epileptiform activity.  He had a loop recorder inserted and so for paroxysmal A. fib has not yet been found.  On Mini-Mental status exam testing today scored  24/30.  He has s was not found to be depressed on geriatric depression scale.   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 Dx ~ 2010.    Social History:  Social History   Socioeconomic History   Marital status: Married    Spouse name: Shane Welch   Number of children: Not on file   Years of education: Not on file   Highest education level: Not on file  Occupational History   Not on file  Tobacco Use   Smoking status: Former    Packs/day: 1.00    Years: 30.00    Pack years: 30.00    Types: Cigarettes   Smokeless tobacco: Never   Tobacco comments:    quit 1993  Vaping Use   Vaping Use: Never used  Substance and Sexual Activity   Alcohol use: Yes    Alcohol/week: 0.0 standard drinks    Comment: rare drink   Drug use: No   Sexual activity: Not on file  Other Topics Concern   Not on file  Social History Narrative   Lives with wife and son   Right Handed   Drinks 2-3 cups caffeine daily   Social Determinants of Health   Financial Resource Strain: Not on file  Food Insecurity: Not on file  Transportation Needs: Not on file  Physical Activity: Not on file  Stress: Not on file  Social Connections: Not on file  Intimate Partner Violence: Not on file    Medications:   Current Outpatient Medications on File Prior to Visit  Medication Sig Dispense Refill   acetaminophen (TYLENOL) 325 MG tablet Take  650 mg by mouth every 6 (six) hours as needed for mild pain, moderate pain or headache.     clopidogrel (PLAVIX) 75 MG tablet Take 1 tablet (75 mg total) by mouth daily. Please take Aspirin 81 mg daily along with Plavix 75 mg daily for 30 days then after that STOP the aspirin and continue ONLY Plavix 75 mg daily indefinitely--for stroke prevention 30 tablet 11   finasteride (PROSCAR) 5 MG tablet Take 1 tablet by mouth daily.     glipiZIDE (GLUCOTROL XL) 5 MG 24 hr tablet Take 5 mg by mouth daily with breakfast.     metFORMIN (GLUCOPHAGE) 1000 MG tablet Take 1 tablet (1,000 mg total) by mouth 2 (two) times daily with a meal. 60 tablet 3   midodrine (PROAMATINE) 2.5 MG tablet  Take 1 tablet (2.5 mg total) by mouth 3 (three) times daily with meals. 30 tablet 0   rosuvastatin (CRESTOR) 20 MG tablet Take 1 tablet (20 mg total) by mouth daily. 90 tablet 3   tamsulosin (FLOMAX) 0.4 MG CAPS capsule      No current facility-administered medications on file prior to visit.    Allergies:   Allergies  Allergen Reactions   Sulfa Antibiotics Hives    Physical Exam General: well developed, well nourished elderly Caucasian male, seated, in no evident distress Head: head normocephalic and atraumatic.   Neck: supple with no carotid or supraclavicular bruits Cardiovascular: regular rate and rhythm, no murmurs Musculoskeletal: no deformity Skin:  no rash/petichiae Vascular:  Normal pulses all extremities  Neurologic Exam Mental Status: Awake and fully alert. Oriented to place and time. Recent and remote memory diminished. Attention span, concentration and fund of knowledge appropriate. Mood and affect appropriate.  Recall 0/3.  Able to name 13 animals which can walk on 4 legs.  Clock drawing 3/4.  Mini-Mental status exam score 24/30.  Geriatric depression scale score 3. Cranial Nerves: Fundoscopic exam reveals sharp disc margins. Pupils equal, briskly reactive to light. Extraocular movements full without  nystagmus. Visual fields full to confrontation. Hearing diminished bilaterally. Facial sensation intact. Face, tongue, palate moves normally and symmetrically.  Motor: Normal bulk and tone. Normal strength in all tested extremity muscles. Sensory.: intact to touch , pinprick , position and vibratory sensation.  Coordination: Rapid alternating movements normal in all extremities. Finger-to-nose and heel-to-shin performed accurately bilaterally. Gait and Station: Arises from chair without difficulty. Stance is normal. Gait demonstrates normal stride length and balance . Able to heel, toe and tandem walk with slight difficulty.  Reflexes: 1+ and symmetric except both ankle jerks are depressed slightly.. Toes downgoing.   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  Vascular risk factors of coronary artery disease, diabetes, hypertension, hyperlipidemia.  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  I recommend a trial of Aricept 5 mg daily for a month increase if tolerated without side effects to 10 mg daily.  He was also increased to increase participation in cognitively challenging activities like solving crossword puzzles, playing bridge and sodoku.  We  also discussed memory compensation strategies.  He will continue on Plavix for secondary stroke prevention and maintain aggressive risk factor modification with strict control of hypertension with blood pressure goal below 130/90, lipids with LDL cholesterol goal below 70 mg percent and diabetes with hemoglobin A1c goal below 6.5%.  He will return for follow-up in the future in 3 months or call earlier if necessary.Greater than 50% time during this 30-minute  visit was spent on counseling  and coordination of care about his embolic occipital stroke as well as discussion about mild cognitive impairment and answering questions. Delia Heady, MD Note: This document was prepared with digital dictation and possible smart phrase technology. Any transcriptional errors that result from this process are unintentional.

## 2021-07-11 NOTE — Telephone Encounter (Signed)
Spoke to PCP office. Aware pt is scheduled for Monday to discuss plan further.  Spoke to wife (dpr on file). Discussed most likely reason for low HR reading. Aware will discuss plan Monday. Wife really appreciates the follow up call.

## 2021-07-15 ENCOUNTER — Encounter: Payer: Self-pay | Admitting: Student

## 2021-07-15 ENCOUNTER — Ambulatory Visit (INDEPENDENT_AMBULATORY_CARE_PROVIDER_SITE_OTHER): Payer: Medicare PPO | Admitting: Student

## 2021-07-15 ENCOUNTER — Ambulatory Visit (INDEPENDENT_AMBULATORY_CARE_PROVIDER_SITE_OTHER): Payer: Medicare PPO

## 2021-07-15 ENCOUNTER — Other Ambulatory Visit: Payer: Self-pay

## 2021-07-15 VITALS — BP 140/76 | HR 54 | Ht 71.0 in | Wt 205.6 lb

## 2021-07-15 DIAGNOSIS — I639 Cerebral infarction, unspecified: Secondary | ICD-10-CM | POA: Diagnosis not present

## 2021-07-15 DIAGNOSIS — I493 Ventricular premature depolarization: Secondary | ICD-10-CM

## 2021-07-15 LAB — CUP PACEART REMOTE DEVICE CHECK
Date Time Interrogation Session: 20220922212059
Implantable Pulse Generator Implant Date: 20220610

## 2021-07-15 MED ORDER — METOPROLOL SUCCINATE ER 25 MG PO TB24: 25.0000 mg | ORAL_TABLET | Freq: Every day | ORAL | 3 refills | Status: DC

## 2021-07-15 NOTE — Progress Notes (Signed)
Electrophysiology Office Note Date: 07/15/2021  ID:  Shane Welch, DOB 09/29/1941, MRN [id number]  PCP: Kaleen Mask, MD Primary Cardiologist: Lance Muss, MD Electrophysiologist: Dr. Elberta Fortis  CC: ILR follow-up  Shane Welch is a 80 y.o. male seen today for Dr. Elberta Fortis . he presents today for routine electrophysiology followup.  Since last being seen in our clinic, the patient reports doing well overall.  He has had continued fatigue which is not new for him. He has had several reports of bradycardia in various office visits by pulse ox. He has not noted any change in his energy level when his HRs have bee in the 50s vs when he's in the 70s.   he denies chest pain, palpitations, dyspnea, PND, orthopnea, nausea, vomiting, syncope, edema, weight gain, or early satiety.  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 Dx ~ 2010.   Past Surgical History:  Procedure Laterality Date   CARDIAC CATHETERIZATION N/A 12/08/2015   Procedure: Left Heart Cath and Coronary Angiography;  Surgeon: Corky Crafts, MD;  Location: [facility] INVASIVE CV LAB;  Service: Cardiovascular;  Laterality: N/A;   CARDIAC CATHETERIZATION  12/08/2015   Procedure: Coronary Stent Intervention;  Surgeon: Corky Crafts, MD;  Location[facility] INVASIVE CV LAB;  Service: Cardiovascular;;   CERVICAL SPINE SURGERY     a. ~ 2002.   Right Knee Surgery     a. 08/2015.    Current Outpatient Medications  Medication Sig Dispense Refill   acetaminophen (TYLENOL) 325  MG tablet Take 650 mg by mouth every 6 (six) hours as needed for mild pain, moderate pain or headache.     clopidogrel (PLAVIX) 75 MG tablet Take 1 tablet (75 mg total) by mouth daily. Please take Aspirin 81 mg daily along with Plavix 75 mg daily for 30 days then after that STOP the aspirin and continue ONLY Plavix 75 mg daily indefinitely--for stroke prevention 30 tablet 11   donepezil (ARICEPT) 10 MG tablet Take 1 tablet (10 mg total) by mouth at bedtime. Start 1/2 tablet daily x 4 weeks and then 1 tablet daily 30 tablet 3   finasteride (PROSCAR) 5 MG tablet Take 1 tablet by mouth daily.     glipiZIDE (GLUCOTROL XL) 5 MG 24 hr tablet Take 5 mg by mouth daily with breakfast.     metFORMIN (GLUCOPHAGE) 1000 MG tablet Take 1 tablet (1,000 mg total) by mouth 2 (two) times daily with a meal. 60 tablet 3   midodrine (PROAMATINE) 2.5 MG tablet Take 1 tablet (2.5 mg total) by mouth 3 (three) times daily with meals. 30 tablet 0   rosuvastatin (CRESTOR) 20 MG tablet Take 1 tablet (20 mg total) by mouth daily. 90 tablet 3   tamsulosin (FLOMAX) 0.4 MG CAPS capsule      No current facility-administered medications for this visit.    Allergies:   Sulfa antibiotics   Social History: Social History   Socioeconomic History   Marital status: Married    Spouse name: Shane Welch   Number of children: Not  on file   Years of education: Not on file   Highest education level: Not on file  Occupational History   Not on file  Tobacco Use   Smoking status: Former    Packs/day: 1.00    Years: 30.00    Pack years: 30.00    Types: Cigarettes   Smokeless tobacco: Never   Tobacco comments:    quit 1993  Vaping Use   Vaping Use: Never used  Substance and Sexual Activity   Alcohol use: Yes    Alcohol/week: 0.0 standard drinks    Comment: rare drink   Drug use: No   Sexual activity: Not on file  Other Topics Concern   Not on file  Social History Narrative   Lives with wife and son   Right Handed   Drinks  2-3 cups caffeine daily   Social Determinants of Health   Financial Resource Strain: Not on file  Food Insecurity: Not on file  Transportation Needs: Not on file  Physical Activity: Not on file  Stress: Not on file  Social Connections: Not on file  Intimate Partner Violence: Not on file    Family History: Family History  Problem Relation Age of Onset   Cancer Father        died in his 6's.   Dementia Mother        died in her 33's.     Review of Systems: All other systems reviewed and are otherwise negative except as noted above.  Physical Exam: Vitals:   07/15/21 1101  BP: 140/76  Pulse: (!) 54  SpO2: 96%  Weight: 205 lb 9.6 oz (93.3 kg)  Height: 5\' 11"  (1.803 m)     GEN- The patient is well appearing, alert and oriented x 3 today.   HEENT: normocephalic, atraumatic; sclera clear, conjunctiva pink; hearing intact; oropharynx clear; neck supple  Lungs- Clear to ausculation bilaterally, normal work of breathing.  No wheezes, rales, rhonchi Heart- Regular rate and rhythm, no murmurs, rubs or gallops  GI- soft, non-tender, non-distended, bowel sounds present  Extremities- no clubbing, cyanosis, or edema  MS- no significant deformity or atrophy Skin- warm and dry, no rash or lesion; PPM pocket well healed Psych- euthymic mood, full affect Neuro- strength and sensation are intact  PPM Interrogation- reviewed in detail on carelink.   EKG:  EKG is not ordered today.  Recent Labs: 01/07/2021: ALT 14; BUN 29; Hemoglobin 15.1; Platelets 213; Potassium 4.5; Sodium 138 02/19/2021: TSH 3.110 05/28/2021: Creatinine, Ser 1.50   Wt Readings from Last 3 Encounters:  07/15/21 205 lb 9.6 oz (93.3 kg)  07/09/21 202 lb (91.6 kg)  06/04/21 202 lb (91.6 kg)     Other studies Reviewed: Additional studies/ records that were reviewed today include: Echo 12/2020 shows LVEF 50-55%, Previous EP office notes, Previous remote checks, Most recent labwork.   Assessment and Plan:  1.  Cryptogenic Stroke s/p Medtronic Loop recorder Normal device function See Pace Art report No changes today  2. Frequent PVCs / Bradysphygmia No true bradycardia has been noted. "Bradycardia event" on loop recorder was false due to undersensing after auto-gained for PVCs.  He wore a 3 day monitor that showed a minimum HR of 61 bpm, but >33% PVCs.  Add Toprol 25 mg daily Consider mexitil at next visit Not candidate for flecainide with known CAD and stent.    Current medicines are reviewed at length with the patient today.    Disposition:   Follow up with EP APP  in 6 Weeks to consider addition of mexitil. If tolerating would then consider updating monitor at that time.    Dustin Flock, PA-C  07/15/2021 11:34 AM  [facility] [address] [place] [address] [phone] (office) [phone] (fax)

## 2021-07-15 NOTE — Patient Instructions (Signed)
Medication Instructions:  Your physician has recommended you make the following change in your medication:   START: Metoprolol Succinate 25mg  daily at bedtime  *If you need a refill on your cardiac medications before your next appointment, please call your pharmacy*   Lab Work: None If you have labs (blood work) drawn today and your tests are completely normal, you will receive your results only by: MyChart Message (if you have MyChart) OR A paper copy in the mail If you have any lab test that is abnormal or we need to change your treatment, we will call you to review the results.   Follow-Up: At [facility], you and your health needs are our priority.  As part of our continuing mission to provide you with exceptional heart care, we have created designated Provider Care Teams.  These Care Teams include your primary Cardiologist (physician) and Advanced Practice Providers (APPs -  Physician Assistants and Nurse Practitioners) who all work together to provide you with the care you need, when you need it.  We recommend signing up for the patient portal called "MyChart".  Sign up information is provided on this After Visit Summary.  MyChart is used to connect with patients for Virtual Visits (Telemedicine).  Patients are able to view lab/test results, encounter notes, upcoming appointments, etc.  Non-urgent messages can be sent to your provider as well.   To learn more about what you can do with MyChart, go to [facility].    Your next appointment:   08/26/2021

## 2021-07-19 NOTE — Progress Notes (Signed)
Carelink Summary Report / Loop Recorder 

## 2021-07-23 ENCOUNTER — Telehealth: Payer: Self-pay

## 2021-07-23 NOTE — Telephone Encounter (Signed)
LINQ alert received.  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  Indication for ILR cryptogenic CVA Route to triage for review LR   Transmission reviewed. Successful telephone encounter to patient and wife (on [place]) to assess for symptoms. Patient asymptomatic during episode. Compliant with metoprolol and plavix. 6 week FU with A. Lanna Poche, [place] 08/26/21 confirmed with wife. Will route to Dr. Elberta Fortis and A. T[place], PA for review.

## 2021-07-31 ENCOUNTER — Encounter: Payer: Self-pay | Admitting: Cardiology

## 2021-08-05 NOTE — Telephone Encounter (Signed)
LM for pt to call back to schedule appt due to an alert we received from his LINQ.

## 2021-08-14 LAB — CUP PACEART REMOTE DEVICE CHECK
Date Time Interrogation Session: 20221025212437
Implantable Pulse Generator Implant Date: 20220610

## 2021-08-18 NOTE — Progress Notes (Addendum)
Cardiology Office Note Date:  08/20/2021  Patient ID:  Shane Welch, Shane Welch 1941/06/28, MRN [id number] PCP:  Kaleen Mask, MD  Cardiologist:  Dr. Eldridge Dace Electrophysiologist: Dr. Elberta Fortis    Chief Complaint:  6 weeks follow up  History of Present Illness: Shane Welch is a 80 y.o. male with history of DM, HTN, HLD, CAD (PCI to LAD 2017), stroke.  He comes in today to be seen for Dr. Elberta Fortis, last seen by him June 2022, seen for cryptogenic stroke and had loop implanted.  More recently saw A. Tiller, PA-C 07/15/21, c/o fatigue that was not new, reported being told of slow HRs by pulse ox at various office visits, discussed Frequent PVCs / Bradysphygmia, no true bradycardia has been noted. "Bradycardia event" on loop recorder was false due to undersensing after auto-gained for PVCs.  He wore a 3 day monitor that showed a minimum HR of 61 bpm, but >33% PVCs and started on Toprol, with mention of consideration to start mexiletine at his next visit. Planned for 6 week follow up.  TODAY He is accompanied by his wife. He is clearly very frustrated about how he is feeling, says he has spoken to all of his providers and no one seems to be interested or have explanations, he is ready to just stop all of his medicines.Marland Kitchen He reports that ever since his stroke he has felt weak, tired, can do nothing without having to sit because he feels so weak.  We spent a lot of time today trying to get a handle on what this is. He is tired, all of the time. He also feels physically weak, more so since increasing the Crestor, but even before that feels like his legts just do not have the strength to hold him up. And orthostatic., he tries to keep track of this, is no longer falling like he had been early on after his stroke, but always looking for a chair or somewhere to sit in case he needs to quickly  (In review of his chart, this seems to echo his concerns/symptoms after his MI)  2. CP In the last  couple months he is having a pressure in his center chest, no radiation, not particularly exertional or positional. No diaphoresis, perhaps a little winded with it A couple weeks ago had a few in a day, some lasting hours.  They seem to come and go, no warm up or down  No syncope No palpitations The only thing that is better is his memory since starting aricept by Der. Sethi at his last visit   3. 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 Dx ~ 2010.    Past Surgical History:  Procedure Laterality Date   CARDIAC CATHETERIZATION N/A 12/08/2015   Procedure: Left Heart Cath and Coronary Angiography;  Surgeon: Corky Crafts, MD;  Location: [facility] INVASIVE CV LAB;  Service: Cardiovascular;  Laterality: N/A;   CARDIAC CATHETERIZATION  12/08/2015   Procedure: Coronary Stent Intervention;  Surgeon: Donnie Coffin  Irish Lack, MD;  Location: [place] CV LAB;  Service: Cardiovascular;;   CERVICAL SPINE SURGERY     a. ~ 2002.   Right Knee Surgery     a. 08/2015.    Current Outpatient Medications  Medication Sig Dispense Refill   acetaminophen (TYLENOL) 325 MG tablet Take 650 mg by mouth every 6 (six) hours as needed for mild pain, moderate pain or headache.     donepezil (ARICEPT) 10 MG tablet Take 1 tablet (10 mg total) by mouth at bedtime. Start 1/2 tablet daily x 4 weeks and then 1 tablet daily 30 tablet 3   finasteride (PROSCAR) 5 MG tablet Take 1 tablet by mouth daily.     glipiZIDE (GLUCOTROL XL) 5 MG 24 hr  tablet Take 5 mg by mouth daily with breakfast.     metFORMIN (GLUCOPHAGE) 1000 MG tablet Take 1 tablet (1,000 mg total) by mouth 2 (two) times daily with a meal. 60 tablet 3   midodrine (PROAMATINE) 2.5 MG tablet Take 1 tablet (2.5 mg total) by mouth 3 (three) times daily with meals. 30 tablet 0   rosuvastatin (CRESTOR) 20 MG tablet Take 1 tablet (20 mg total) by mouth daily. 90 tablet 3   tamsulosin (FLOMAX) 0.4 MG CAPS capsule      metoprolol succinate (TOPROL XL) 25 MG 24 hr tablet Take 0.5 tablets (12.5 mg total) by mouth at bedtime. 45 tablet 2   No current facility-administered medications for this visit.    Allergies:   Sulfa antibiotics   Social History:  The patient  reports that he has quit smoking. His smoking use included cigarettes. He has a 30.00 pack-year smoking history. He has never used smokeless tobacco. He reports current alcohol use. He reports that he does not use drugs.   Family History:  The patient's family history includes Cancer in his father; Dementia in his mother.  ROS:  Please see the history of present illness.    All other systems are reviewed and otherwise negative.   PHYSICAL EXAM:  VS:  BP 110/82   Pulse 74   Ht 5\' 11"  (1.803 m)   Wt 210 lb 6.4 oz (95.4 kg)   SpO2 97%   BMI 29.34 kg/m  BMI: Body mass index is 29.34 kg/m. Well nourished, well developed, in no acute distress HEENT: normocephalic, atraumatic Neck: no JVD, carotid bruits or masses Cardiac:  RRR; no significant murmurs, no rubs, or gallops Lungs:  CTA b/l, no wheezing, rhonchi or rales Abd: soft, nontender MS: no deformity or atrophy Ext: no edema Skin: warm and dry, no rash Neuro:  No gross deficits appreciated Psych: euthymic mood, full affect    EKG:  Done today and reviewed by myself shows  SR 74bpm , no ischemic changes, PVCs (2)  Device interrogation done today and reviewed by myself:  Battery is good One AFib episode noted  07/22/21, duration 78minutes   TTE  01/08/21   1. Left ventricular ejection fraction, by estimation, is 50 to 55%. The  left ventricle has normal function. The left ventricle has no regional  wall motion abnormalities. There is mild left ventricular hypertrophy.  Left ventricular diastolic parameters  are indeterminate.   2. Right ventricular systolic function is normal. The right ventricular  size is normal.   3. The mitral valve is normal in structure. No evidence of mitral valve  regurgitation. No evidence of mitral stenosis.   4. The aortic valve is tricuspid. Aortic valve regurgitation is not  visualized. No  aortic stenosis is present.   5. Aortic dilatation noted. There is mild dilatation of the aortic root,  measuring 42 mm.   6. The inferior vena cava is normal in size with greater than 50%  respiratory variability, suggesting right atrial pressure of 3 mmHg.    12/08/2015: LHC/PCI Mid LAD lesion, 99% stenosed. Post intervention with aspiration thrombectomy and a 3.0 x 20 Promus drug-eluting stent postdilated to 3.5 mm, there is a 0% residual stenosis. Normal LVEDP. Left ventricular gram not done because wants to avoid excessive contrast exposure. From the right wrist, an EBU 3.5 guide catheter may better engage the left main a future procedure is needed. Left dominant system. Non-dominant RCA was no selectively injected.   Continue dual antiplatelet therapy for at least a year. He'll need aggressive secondary prevention and diabetes control. Continue IV tirofiban for 4 hours. He'll be watched in the ICU. I suspect to be in the hospital for 2 days. Assess left ventricular function with echocardiogram.     Recent Labs: 01/07/2021: ALT 14; BUN 29; Hemoglobin 15.1; Platelets 213; Potassium 4.5; Sodium 138 02/19/2021: TSH 3.110 05/28/2021: Creatinine, Ser 1.50  01/08/2021: Cholesterol 178; HDL 32; LDL Cholesterol 113; Total CHOL/HDL Ratio 5.6; Triglycerides 167; VLDL 33   CrCl cannot be calculated (Patient's most recent  lab result is older than the maximum 21 days allowed.).   Wt Readings from Last 3 Encounters:  08/20/21 210 lb 6.4 oz (95.4 kg)  07/15/21 205 lb 9.6 oz (93.3 kg)  07/09/21 202 lb (91.6 kg)     Other studies reviewed: Additional studies/records reviewed today include: summarized above  ASSESSMENT AND PLAN:  ILR Cryptogenic stroke 2. Newly found AFIb CHA2DS2Vasc is 7  Stop plavix Start Eliquis 5mg  BID CMET, CBC today  They seem to think he was on eliquis historically, but I do not see anywhere that he has been on any [place]  PVCs Started on BB He is mildly orthostatic today, describes significant symptoms  I have asked then to revisit his meds with the urologist, seems that flomax may have been an option to stop.  Though he has had significant issues historically including having to self-cath so leave it to them  I have had the [facility] counter on his loop programmed on For now, reduce his Toprol to 12.5mg  daily, (1.2 tab) to take in the evening  We dicussed to take the midodrine during awake times TID, not at bedtime Discussed safety  CAD He has been having some CP Not overly typical sounding though has CAD, plan lexiscan stress test, he is agreeable   HTN Looks OK, orthostatic though  HLD Increase leg aching with increased crestor, I will reach out to Dr. Irish Lack LDL at last check was not great    Disposition: F/u with cardiology or EP in a month, sooner if needed.  Current medicines are reviewed at length with the patient today.  The patient did not have any concerns regarding medicines.  Venetia Night, PA-C 08/20/2021 4:37 PM     [address] [place] [id number] [phone] (office)  [phone] (fax)

## 2021-08-19 ENCOUNTER — Ambulatory Visit (INDEPENDENT_AMBULATORY_CARE_PROVIDER_SITE_OTHER): Payer: Medicare PPO

## 2021-08-19 DIAGNOSIS — I639 Cerebral infarction, unspecified: Secondary | ICD-10-CM | POA: Diagnosis not present

## 2021-08-20 ENCOUNTER — Ambulatory Visit: Payer: Medicare PPO | Admitting: Physician Assistant

## 2021-08-20 ENCOUNTER — Encounter: Payer: Self-pay | Admitting: Physician Assistant

## 2021-08-20 ENCOUNTER — Other Ambulatory Visit: Payer: Self-pay

## 2021-08-20 ENCOUNTER — Encounter: Payer: Self-pay | Admitting: *Deleted

## 2021-08-20 VITALS — BP 110/82 | HR 74 | Ht 71.0 in | Wt 210.4 lb

## 2021-08-20 DIAGNOSIS — I951 Orthostatic hypotension: Secondary | ICD-10-CM

## 2021-08-20 DIAGNOSIS — I2510 Atherosclerotic heart disease of native coronary artery without angina pectoris: Secondary | ICD-10-CM | POA: Diagnosis not present

## 2021-08-20 DIAGNOSIS — I480 Paroxysmal atrial fibrillation: Secondary | ICD-10-CM

## 2021-08-20 DIAGNOSIS — Z79899 Other long term (current) drug therapy: Secondary | ICD-10-CM | POA: Diagnosis not present

## 2021-08-20 DIAGNOSIS — R079 Chest pain, unspecified: Secondary | ICD-10-CM | POA: Diagnosis not present

## 2021-08-20 DIAGNOSIS — I10 Essential (primary) hypertension: Secondary | ICD-10-CM

## 2021-08-20 MED ORDER — APIXABAN 5 MG PO TABS: 5.0000 mg | ORAL_TABLET | Freq: Two times a day (BID) | ORAL | 2 refills | Status: DC

## 2021-08-20 MED ORDER — METOPROLOL SUCCINATE ER 25 MG PO TB24: 12.5000 mg | ORAL_TABLET | Freq: Every day | ORAL | 2 refills | Status: DC

## 2021-08-20 NOTE — Patient Instructions (Addendum)
Medication Instructions:   STOP TAKING PLAVIX    START TAKING ELIQUIS 5 MG TWICE A DAY   STAT TAKING TOPROL XL 12.5 MG ONCE A DAY IN THE EVENING   *If you need a refill on your cardiac medications before your next appointment, please call your pharmacy*   Lab Work:  CMET AND CBC TODAY     If you have labs (blood work) drawn today and your tests are completely normal, you will receive your results only by: MyChart Message (if you have MyChart) OR A paper copy in the mail If you have any lab test that is abnormal or we need to change your treatment, we will call you to review the results.   Testing/Procedures: Your physician has requested that you have a lexiscan myoview. For further information please visit [email or web address] Please follow instruction sheet, as given.    Follow-Up: At [facility], you and your health needs are our priority.  As part of our continuing mission to provide you with exceptional heart care, we have created designated Provider Care Teams.  These Care Teams include your primary Cardiologist (physician) and Advanced Practice Providers (APPs -  Physician Assistants and Nurse Practitioners) who all work together to provide you with the care you need, when you need it.  We recommend signing up for the patient portal called "MyChart".  Sign up information is provided on this After Visit Summary.  MyChart is used to connect with patients for Virtual Visits (Telemedicine).  Patients are able to view lab/test results, encounter notes, upcoming appointments, etc.  Non-urgent messages can be sent to your provider as well.   To learn more about what you can do with MyChart, go to [email or web address].    Your next appointment:     1 month(s)  The format for your next appointment:   In Person  Provider: any available APP     Other Instructions     DISCUSS WITH UROLOGIST [facility] MEDICATION

## 2021-08-21 ENCOUNTER — Other Ambulatory Visit: Payer: Self-pay | Admitting: *Deleted

## 2021-08-21 DIAGNOSIS — Z79899 Other long term (current) drug therapy: Secondary | ICD-10-CM

## 2021-08-21 LAB — COMPREHENSIVE METABOLIC PANEL
ALT: 17 IU/L (ref 0–44)
AST: 17 IU/L (ref 0–40)
Albumin/Globulin Ratio: 2 (ref 1.2–2.2)
Albumin: 4.3 g/dL (ref 3.7–4.7)
Alkaline Phosphatase: 55 IU/L (ref 44–121)
BUN/Creatinine Ratio: 13 (ref 10–24)
BUN: 23 mg/dL (ref 8–27)
Bilirubin Total: 0.2 mg/dL (ref 0.0–1.2)
CO2: 25 mmol/L (ref 20–29)
Calcium: 9.2 mg/dL (ref 8.6–10.2)
Chloride: 102 mmol/L (ref 96–106)
Creatinine, Ser: 1.8 mg/dL — ABNORMAL HIGH (ref 0.76–1.27)
Globulin, Total: 2.1 g/dL (ref 1.5–4.5)
Glucose: 257 mg/dL — ABNORMAL HIGH (ref 70–99)
Potassium: 4.8 mmol/L (ref 3.5–5.2)
Sodium: 140 mmol/L (ref 134–144)
Total Protein: 6.4 g/dL (ref 6.0–8.5)
eGFR: 38 mL/min/{1.73_m2} — ABNORMAL LOW (ref >59)

## 2021-08-21 LAB — CBC
Hematocrit: 43 % (ref 37.5–51.0)
Hemoglobin: 14.6 g/dL (ref 13.0–17.7)
MCH: 30.1 pg (ref 26.6–33.0)
MCHC: 34 g/dL (ref 31.5–35.7)
MCV: 89 fL (ref 79–97)
Platelets: 206 10*3/uL (ref 150–450)
RBC: 4.85 x10E6/uL (ref 4.14–5.80)
RDW: 13.6 % (ref 11.6–15.4)
WBC: 10.3 10*3/uL (ref 3.4–10.8)

## 2021-08-26 ENCOUNTER — Encounter: Payer: Medicare PPO | Admitting: Student

## 2021-08-26 NOTE — Progress Notes (Signed)
Carelink Summary Report / Loop Recorder 

## 2021-08-29 ENCOUNTER — Ambulatory Visit (HOSPITAL_COMMUNITY): Payer: Medicare PPO

## 2021-08-29 ENCOUNTER — Other Ambulatory Visit: Payer: Medicare PPO

## 2021-09-16 ENCOUNTER — Ambulatory Visit (INDEPENDENT_AMBULATORY_CARE_PROVIDER_SITE_OTHER): Payer: Medicare PPO

## 2021-09-16 DIAGNOSIS — I639 Cerebral infarction, unspecified: Secondary | ICD-10-CM | POA: Diagnosis not present

## 2021-09-17 LAB — CUP PACEART REMOTE DEVICE CHECK
Date Time Interrogation Session: 20221127212609
Implantable Pulse Generator Implant Date: 20220610

## 2021-09-18 ENCOUNTER — Telehealth (HOSPITAL_COMMUNITY): Payer: Self-pay | Admitting: *Deleted

## 2021-09-18 NOTE — Telephone Encounter (Signed)
Patient given detailed instructions per Myocardial Perfusion Study Information Sheet for the test on 09/25/21  Patient notified to arrive 15 minutes early and that it is imperative to arrive on time for appointment to keep from having the test rescheduled.  If you need to cancel or reschedule your appointment, please call the office within 24 hours of your appointment. . Patient verbalized understanding. Kristiane Morsch Jacqueline   

## 2021-09-23 NOTE — Progress Notes (Signed)
Carelink Summary Report / Loop Recorder 

## 2021-09-25 ENCOUNTER — Ambulatory Visit (HOSPITAL_COMMUNITY): Payer: Medicare PPO | Attending: Cardiovascular Disease

## 2021-09-25 ENCOUNTER — Other Ambulatory Visit: Payer: Self-pay

## 2021-09-25 DIAGNOSIS — I2510 Atherosclerotic heart disease of native coronary artery without angina pectoris: Secondary | ICD-10-CM | POA: Diagnosis present

## 2021-09-25 DIAGNOSIS — R079 Chest pain, unspecified: Secondary | ICD-10-CM | POA: Diagnosis present

## 2021-09-25 LAB — MYOCARDIAL PERFUSION IMAGING
LV dias vol: 98 mL (ref 62–150)
LV sys vol: 65 mL
Nuc Stress EF: 37 %
Peak HR: 85 {beats}/min
Rest HR: 73 {beats}/min
Rest Nuclear Isotope Dose: 10.2 mCi
SDS: 2
SRS: 0
SSS: 2
ST Depression (mm): 0 mm
Stress Nuclear Isotope Dose: 31.6 mCi
TID: 0.98

## 2021-09-25 MED ORDER — REGADENOSON 0.4 MG/5ML IV SOLN
0.4000 mg | Freq: Once | INTRAVENOUS | Status: AC
  Administered 2021-09-25: 0.4 mg via INTRAVENOUS

## 2021-09-25 MED ORDER — TECHNETIUM TC 99M TETROFOSMIN IV KIT
31.6000 | PACK | Freq: Once | INTRAVENOUS | Status: AC | PRN
  Administered 2021-09-25: 31.6 via INTRAVENOUS
  Filled 2021-09-25: qty 32

## 2021-09-25 MED ORDER — TECHNETIUM TC 99M TETROFOSMIN IV KIT
10.2000 | PACK | Freq: Once | INTRAVENOUS | Status: AC | PRN
  Administered 2021-09-25: 10.2 via INTRAVENOUS
  Filled 2021-09-25: qty 11

## 2021-09-26 ENCOUNTER — Encounter (HOSPITAL_COMMUNITY): Payer: Medicare PPO

## 2021-09-26 ENCOUNTER — Other Ambulatory Visit: Payer: Medicare PPO

## 2021-09-26 ENCOUNTER — Other Ambulatory Visit: Payer: Self-pay | Admitting: *Deleted

## 2021-09-26 DIAGNOSIS — R9430 Abnormal result of cardiovascular function study, unspecified: Secondary | ICD-10-CM

## 2021-09-30 ENCOUNTER — Encounter: Payer: Self-pay | Admitting: Physician Assistant

## 2021-09-30 DIAGNOSIS — I493 Ventricular premature depolarization: Secondary | ICD-10-CM

## 2021-09-30 DIAGNOSIS — I480 Paroxysmal atrial fibrillation: Secondary | ICD-10-CM | POA: Insufficient documentation

## 2021-09-30 DIAGNOSIS — I959 Hypotension, unspecified: Secondary | ICD-10-CM | POA: Insufficient documentation

## 2021-09-30 HISTORY — DX: Ventricular premature depolarization: I49.3

## 2021-09-30 HISTORY — DX: Paroxysmal atrial fibrillation: I48.0

## 2021-09-30 NOTE — Progress Notes (Signed)
Cardiology Office Note:    Date:  10/01/2021   ID:  Shane Welch, DOB [birth year], MRN [id number]  PCP:  Leonard Downing, MD   [facility] Providers Cardiologist:  Larae Grooms, MD Electrophysiologist:  Constance Haw, MD     Referring MD: Leonard Downing, *   Chief Complaint:  F/u on PVCs, CAD    Patient Profile:   Shane Welch is a 80 y.o. male with:  Coronary artery disease  Anterior STEMI in 2/17 s/p DES to mLAD Ischemic CM EF 40-45 in 2/17 >> improved to normal Echocardiogram 6/17: EF 55 Echocardiogram 3/22: EF 50-55 Hx of CVA in 3/22 S/p ILR Paroxysmal atrial fibrillation  Short term memory difficulty PVCs  Monitor 9/22: burden > 33% EP 9/22 >> Metoprolol succ 25 QD Dilated aortic root (Echocardiogram 3/22: 42 mm) Diabetes mellitus  Chronic kidney disease  Hypertension  Hyperlipidemia  Ex-smoker  Orthostatic hypotension  Midodrine   History of Present Illness: Mr. Allman was seen by Ermalinda Barrios, PA-C in 8/22 for bradycardia after a visit to the [facility] ED with memory issues and balance problems.  Selinda Eon ordered a monitor which demonstrated >33% PVCs.  He was seen by Oda Kilts, PA-C and started on Metoprolol succinate with plans to f/u in several weeks to decide on +/- Mexiletine.  He had a f/u with Tommye Standard, PA-C 11/1.  He noted profound fatigue since his stroke in 3/22.  He felt worse after increasing his Rosuvastatin and also noted continued, milder symptoms of orthostatic intolerance.   He did have one episode of atrial fibrillation on his ILR and he was started on Apixaban.  His dose of Metoprolol succinate was reduced to 12.5 mg once daily and the PC counter was turned on for his ILR.  He complained of chest pain.  A Lexiscan Myoview was obtained that was neg for ischemia but was intermediate risk due to EF 37.  A f/u limited Echocardiogram is pending.    ILR Interrogation 09/15/21: ILR summary report received. Battery status OK.  Normal device function. No new symptom, tachy, brady, or pause episodes. No new AF episodes. Monthly summary reports and ROV/PRN  PVC's 22.5%   He returns for f/u.  He is here with his wife.  Over the past 1-2 weeks, he does feel better.  He feels less fatigue with increased exercise tolerance.  He was having to stop on his way to his shed in his yard.  He can now get to the shed without stopping. He has not had chest pain, shortness of breath, syncope.  He still has issues with orthostatic dizziness but this is stable on the midodrine.  He really did not notice much of change in his symptoms when the Metoprolol dose was reduced.  His improvement was more gradual.  Of note, his fatigue predates the initiation of the beta-blocker.  ASSESSMENT & PLAN:   PVC's (premature ventricular contractions) Pt still has a significant burden of PVCs on his most recent ILR interrogation.  This was with Metoprolol succ 12.5 mg twice daily.  Recent K+ normal.  TSH in May normal.  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  He had a recent Myoview with low EF.  A f/u limited echocardiogram is pending to confirm EF.  He will likely need antiarrhythmic drug Rx vs ablation Rx.  I think it is worth trying him on a higher dose of beta-blocker to see if he can tolerate this.  I did review his case with  Dr. Lovena Le with EP today.  He did not recommend any other changes at this time while the limited echocardiogram is pending.  The pt will eventually need to see Dr. Curt Bears for f/u to help decide management of his PVCs.    -Limited Echocardiogram 10/07/21  -Increase Metoprolol succinate back to 25 mg once daily  -He knows to contact us if fatigue worsens with this dose   -Keep f/u with Dr. Irish Lack 11/18/21  Hyperlipidemia LDL in March 2022 was > 100.  Continue Rosuvastatin 20 mg once daily.  Arrange fasting Lipids and CMET.  If LDL remains > 70, increase Rosuvastatin.  CAD (coronary artery disease) S/p anterior STEMI tx  with DES to the mLAD in 2/17.  Recent Myoview without ischemia.  However, EF is 37. Limited echocardiogram is pending.  He is not having angina at this time.  He is no longer on Plavix as he is on Eliquis now.  Continue Metoprolol succinate at increase dose of 25 mg once daily, Rosuvastatin 20 mg once daily.   Paroxysmal atrial fibrillation (HCC) CHA2DS2-VASc Score = 7 [CHF History: 0, HTN History: 1, Diabetes History: 1, Stroke History: 2, Vascular Disease History: 1, Age Score: 2, Gender Score: 0].  Therefore, the patient's annual risk of stroke is 11.2 %.  Hx of CVA.  Atrial fibrillation identified on ILR.  Continue Apixaban 5 mg twice daily.    Hypotension Orthostatic hypotension seems stable on Midodrine 2.5 mg three times a day.  Continue current Rx.   Dilated aortic root (HCC) 42 mm on echocardiogram in 3/22.  Repeat limited echocardiogram is pending.    CKD (chronic kidney disease), stage III (HCC) Recent Creatinine 1.8. Plan f/u CMET with fasting Lipids next week. If creatinine is higher, will fwd to PCP given ongoing Rx with Metformin for diabetes.             Dispo:  Return in about 7 weeks (around 11/18/2021) for Keep follow up with Dr. Irish Lack. .    Prior CV studies: Myoview 09/25/2021 No ischemia or infarction, EF 37, intermediate risk  Cardiac monitor 06/28/2021 NSR, frequent PVCs, brief runs of PACs, episodes of NSVT PVC burden >33% No atrial fibrillation  Echocardiogram 01/08/2021 EF 50-55, no RWMA, mild LVH, normal RVSF, mild dilation of aortic root (42 mm)  AAA duplex 01/02/2021 Normal abdominal aorta (1.9 cm)  Echocardiogram 11/03/2018: EF 50, aortic root 43 mm  Echocardiogram 03/26/2016: EF 55, aortic root 42 mm  Echocardiogram 12/09/2015: EF 40-45  Cardiac catheterization 12/08/2015 LAD mid 99 LCx luminal irregularities RCA nondominant; not selectively engaged PCI: Aspiration thrombectomy and PTCA with 3 x 20 mm Promus DES to the mid LAD    Past Medical  History:  Diagnosis Date   CAD (coronary artery disease) 12/10/2015   Ant MI 2/17>>DES to LAD // Myoview 12/22: EF 37, no ischemia (int risk)   CKD (chronic kidney disease), stage III ([place]) 10/01/2021   Diabetes ([place]) 12/10/2015   Dilated aortic root ([place]) 09/06/2018   Echo 6/17: 42 mm // Echo 10/2018: 43 mm // Echocardiogram 3/22: 42 mm   Essential hypertension 09/03/2017   Hyperlipidemia    Ischemic cardiomyopathy    s/p Ant MI 2/17 - Echo EF 40-45 // Echo 6/17: EF 55 // Echo 10/2018: EF 50 // Echo 3/22: EF 50-55   Paroxysmal atrial fibrillation ([place]) 09/30/2021   S/p CVA in 3/22 >> ILR // AFib IDd on ILR>>anticoagulation started   PVC's (premature ventricular contractions) 09/30/2021   Monitor 9/22: PVCs  33% // Metoprolol succ started // ILR interr 11/22: PVCs 22.5%   s/p Ant MI in 2017 tx with DES to LAD 03/11/2016   Stroke ([place]) 01/07/2021   Type II diabetes mellitus ([place])    a. Dx ~ 2010.   Current Medications: Current Meds  Medication Sig   acetaminophen (TYLENOL) 325 MG tablet Take 650 mg by mouth every 6 (six) hours as needed for mild pain, moderate pain or headache.   apixaban (ELIQUIS) 5 MG TABS tablet Take 1 tablet (5 mg total) by mouth 2 (two) times daily.   donepezil (ARICEPT) 10 MG tablet Take 1 tablet (10 mg total) by mouth at bedtime. Start 1/2 tablet daily x 4 weeks and then 1 tablet daily   finasteride (PROSCAR) 5 MG tablet Take 1 tablet by mouth daily.   glipiZIDE (GLUCOTROL XL) 5 MG 24 hr tablet Take 5 mg by mouth daily with breakfast.   metFORMIN (GLUCOPHAGE) 1000 MG tablet Take 1 tablet (1,000 mg total) by mouth 2 (two) times daily with a meal.   midodrine (PROAMATINE) 2.5 MG tablet Take 1 tablet (2.5 mg total) by mouth 3 (three) times daily with meals.   rosuvastatin (CRESTOR) 20 MG tablet Take 1 tablet (20 mg total) by mouth daily.   tamsulosin (FLOMAX) 0.4 MG CAPS capsule    [DISCONTINUED] metoprolol succinate (TOPROL XL) 25 MG 24 hr tablet Take 0.5 tablets  (12.5 mg total) by mouth at bedtime.    Allergies:   Sulfa antibiotics   Social History   Tobacco Use   Smoking status: Former    Packs/day: 1.00    Years: 30.00    Pack years: 30.00    Types: Cigarettes   Smokeless tobacco: Never   Tobacco comments:    quit 1993  Vaping Use   Vaping Use: Never used  Substance Use Topics   Alcohol use: Yes    Alcohol/week: 0.0 standard drinks    Comment: rare drink   Drug use: No    Family Hx: The patient's family history includes Cancer in his father; Dementia in his mother.  Review of Systems  Constitutional: Negative for fever.  Respiratory:  Negative for cough.   Gastrointestinal:  Negative for hematochezia.  Genitourinary:  Negative for hematuria.    EKGs/Labs/Other Test Reviewed:    EKG:  EKG is  ordered today.  The ekg ordered today demonstrates NSR, HR 76, LAD, PRWP, PVCs (bigeminy), QTc 477  Recent Labs: 02/19/2021: TSH 3.110 08/20/2021: ALT 17; BUN 23; Creatinine, Ser 1.80; Hemoglobin 14.6; Platelets 206; Potassium 4.8; Sodium 140   Recent Lipid Panel Lab Results  Component Value Date/Time   CHOL 178 01/08/2021 06:30 AM   CHOL 230 (H) 12/24/2020 10:06 AM   TRIG 167 (H) 01/08/2021 06:30 AM   HDL 32 (L) 01/08/2021 06:30 AM   HDL 41 12/24/2020 10:06 AM   LDLCALC 113 (H) 01/08/2021 06:30 AM   LDLCALC 156 (H) 12/24/2020 10:06 AM     Risk Assessment/Calculations:    CHA2DS2-VASc Score = 7   This indicates a 11.2% annual risk of stroke. The patient's score is based upon: CHF History: 0 HTN History: 1 Diabetes History: 1 Stroke History: 2 Vascular Disease History: 1 Age Score: 2 Gender Score: 0        Physical Exam:    VS:  BP (!) 150/58   Pulse 76   Ht _0  (1.803 m)   Wt 205 lb 12.8 oz (93.4 kg)   SpO2 98%   BMI 28.70  kg/m     Wt Readings from Last 3 Encounters:  10/01/21 205 lb 12.8 oz (93.4 kg)  09/25/21 210 lb (95.3 kg)  08/20/21 210 lb 6.4 oz (95.4 kg)    Constitutional:      Appearance:  Healthy appearance. Not in distress.  Neck:     Vascular: No JVR. JVD normal.  Pulmonary:     Effort: Pulmonary effort is normal.     Breath sounds: No wheezing. No rales.  Cardiovascular:     Normal rate. Irregular rhythm. Normal S1. Normal S2.      Murmurs: There is no murmur.  Edema:    Peripheral edema absent.  Abdominal:     Palpations: Abdomen is soft.  Skin:    General: Skin is warm and dry.  Neurological:     General: No focal deficit present.     Mental Status: Alert and oriented to person, place and time.     Cranial Nerves: Cranial nerves are intact.       Medication Adjustments/Labs and Tests Ordered: Current medicines are reviewed at length with the patient today.  Concerns regarding medicines are outlined above.  Tests Ordered: Orders Placed This Encounter  Procedures   Comp Met (CMET)   Lipid Profile   EKG 12-Lead   ECHOCARDIOGRAM COMPLETE   Medication Changes: Meds ordered this encounter  Medications   metoprolol succinate (TOPROL XL) 25 MG 24 hr tablet    Sig: Take 1 tablet (25 mg total) by mouth at bedtime.    Dispense:  90 tablet    Refill:  3   Signed, Richardson Dopp, PA-C  10/01/2021 11:24 AM    [facility] [address], [place], [place]  [address] Phone: [phone]; Fax: [phone]

## 2021-10-01 ENCOUNTER — Other Ambulatory Visit: Payer: Self-pay

## 2021-10-01 ENCOUNTER — Encounter: Payer: Self-pay | Admitting: Physician Assistant

## 2021-10-01 ENCOUNTER — Ambulatory Visit: Payer: Medicare PPO | Admitting: Physician Assistant

## 2021-10-01 VITALS — BP 150/58 | HR 76 | Ht 71.0 in | Wt 205.8 lb

## 2021-10-01 DIAGNOSIS — I480 Paroxysmal atrial fibrillation: Secondary | ICD-10-CM

## 2021-10-01 DIAGNOSIS — I951 Orthostatic hypotension: Secondary | ICD-10-CM

## 2021-10-01 DIAGNOSIS — N1832 Chronic kidney disease, stage 3b: Secondary | ICD-10-CM | POA: Insufficient documentation

## 2021-10-01 DIAGNOSIS — I493 Ventricular premature depolarization: Secondary | ICD-10-CM | POA: Diagnosis not present

## 2021-10-01 DIAGNOSIS — E782 Mixed hyperlipidemia: Secondary | ICD-10-CM | POA: Diagnosis not present

## 2021-10-01 DIAGNOSIS — I77810 Thoracic aortic ectasia: Secondary | ICD-10-CM

## 2021-10-01 DIAGNOSIS — I2510 Atherosclerotic heart disease of native coronary artery without angina pectoris: Secondary | ICD-10-CM

## 2021-10-01 DIAGNOSIS — N183 Chronic kidney disease, stage 3 unspecified: Secondary | ICD-10-CM

## 2021-10-01 HISTORY — DX: Chronic kidney disease, stage 3 unspecified: N18.30

## 2021-10-01 MED ORDER — METOPROLOL SUCCINATE ER 25 MG PO TB24: 25.0000 mg | ORAL_TABLET | Freq: Every day | ORAL | 3 refills | Status: DC

## 2021-10-01 NOTE — Assessment & Plan Note (Addendum)
Pt still has a significant burden of PVCs on his most recent ILR interrogation.  This was with Metoprolol succ 12.5 mg twice daily.  Recent K+ normal.  TSH in May normal.  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  He had a recent Myoview with low EF.  A f/u limited echocardiogram is pending to confirm EF.  He will likely need antiarrhythmic drug Rx vs ablation Rx.  I think it is worth trying him on a higher dose of beta-blocker to see if he can tolerate this.  I did review his case with Dr. Ladona Ridgel with EP today.  He did not recommend any other changes at this time while the limited echocardiogram is pending.  The pt will eventually need to see Dr. Elberta Fortis for f/u to help decide management of his PVCs.    -Limited Echocardiogram 10/07/21  -Increase Metoprolol succinate back to 25 mg once daily  -He knows to contact us if fatigue worsens with this dose   -Keep f/u with Dr. Eldridge Dace 11/18/21

## 2021-10-01 NOTE — Assessment & Plan Note (Signed)
LDL in March 2022 was > 100.  Continue Rosuvastatin 20 mg once daily.  Arrange fasting Lipids and CMET.  If LDL remains > 70, increase Rosuvastatin.

## 2021-10-01 NOTE — Assessment & Plan Note (Addendum)
CHA2DS2-VASc Score = 7 [CHF History: 0, HTN History: 1, Diabetes History: 1, Stroke History: 2, Vascular Disease History: 1, Age Score: 2, Gender Score: 0].  Therefore, the patient's annual risk of stroke is 11.2 %.  Hx of CVA.  Atrial fibrillation identified on ILR.  Continue Apixaban 5 mg twice daily.

## 2021-10-01 NOTE — Assessment & Plan Note (Signed)
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  Recent Myoview without ischemia.  However, EF is 37. Limited echocardiogram is pending.  He is not having angina at this time.  He is no longer on Plavix as he is on Eliquis now.  Continue Metoprolol succinate at increase dose of 25 mg once daily, Rosuvastatin 20 mg once daily.

## 2021-10-01 NOTE — Assessment & Plan Note (Signed)
Orthostatic hypotension seems stable on Midodrine 2.5 mg three times a day.  Continue current Rx.

## 2021-10-01 NOTE — Assessment & Plan Note (Signed)
Recent Creatinine 1.8. Plan f/u CMET with fasting Lipids next week. If creatinine is higher, will fwd to PCP given ongoing Rx with Metformin for diabetes.

## 2021-10-01 NOTE — Assessment & Plan Note (Signed)
42 mm on echocardiogram in 3/22.  Repeat limited echocardiogram is pending.

## 2021-10-01 NOTE — Patient Instructions (Signed)
Medication Instructions:   INCREASE Toprol one (1) tablet by mouth ( 25 mg ) daily.    *If you need a refill on your cardiac medications before your next appointment, please call your pharmacy*   Lab Work:  Your physician recommends that you return for a FASTING lipid profile/cmet Same day as echo, December 19. You can come in on the day of your appointment anytime between 7:30-4:30 fasting from midnight the night before.    If you have labs (blood work) drawn today and your tests are completely normal, you will receive your results only by: MyChart Message (if you have MyChart) OR A paper copy in the mail If you have any lab test that is abnormal or we need to change your treatment, we will call you to review the results.   Testing/Procedures:  Your physician has requested that you have an echocardiogram. Echocardiography is a painless test that uses sound waves to create images of your heart. It provides your doctor with information about the size and shape of your heart and how well your hearts chambers and valves are working. This procedure takes approximately one hour. There are no restrictions for this procedure. Monday, December 19 @ 10:15     Follow-Up: At [facility], you and your health needs are our priority.  As part of our continuing mission to provide you with exceptional heart care, we have created designated Provider Care Teams.  These Care Teams include your primary Cardiologist (physician) and Advanced Practice Providers (APPs -  Physician Assistants and Nurse Practitioners) who all work together to provide you with the care you need, when you need it.  We recommend signing up for the patient portal called "MyChart".  Sign up information is provided on this After Visit Summary.  MyChart is used to connect with patients for Virtual Visits (Telemedicine).  Patients are able to view lab/test results, encounter notes, upcoming appointments, etc.  Non-urgent messages can  be sent to your provider as well.   To learn more about what you can do with MyChart, go to [email or web address].    Your next appointment:   1 month(s)  The format for your next appointment:   In Person  Provider:   Lance Muss, MD     Other Instructions

## 2021-10-07 ENCOUNTER — Other Ambulatory Visit: Payer: Self-pay | Admitting: Physician Assistant

## 2021-10-07 ENCOUNTER — Other Ambulatory Visit: Payer: Medicare PPO

## 2021-10-07 ENCOUNTER — Other Ambulatory Visit: Payer: Self-pay

## 2021-10-07 ENCOUNTER — Ambulatory Visit (HOSPITAL_COMMUNITY): Payer: Medicare PPO | Attending: Cardiovascular Disease

## 2021-10-07 DIAGNOSIS — I951 Orthostatic hypotension: Secondary | ICD-10-CM

## 2021-10-07 DIAGNOSIS — I493 Ventricular premature depolarization: Secondary | ICD-10-CM

## 2021-10-07 DIAGNOSIS — I77810 Thoracic aortic ectasia: Secondary | ICD-10-CM

## 2021-10-07 DIAGNOSIS — I480 Paroxysmal atrial fibrillation: Secondary | ICD-10-CM

## 2021-10-07 DIAGNOSIS — I2510 Atherosclerotic heart disease of native coronary artery without angina pectoris: Secondary | ICD-10-CM | POA: Diagnosis not present

## 2021-10-07 DIAGNOSIS — E782 Mixed hyperlipidemia: Secondary | ICD-10-CM

## 2021-10-07 LAB — LIPID PANEL
Chol/HDL Ratio: 2.4 ratio (ref 0.0–5.0)
Cholesterol, Total: 102 mg/dL (ref 100–199)
HDL: 42 mg/dL (ref >39)
LDL Chol Calc (NIH): 43 mg/dL (ref 0–99)
Triglycerides: 87 mg/dL (ref 0–149)
VLDL Cholesterol Cal: 17 mg/dL (ref 5–40)

## 2021-10-07 LAB — COMPREHENSIVE METABOLIC PANEL
ALT: 14 IU/L (ref 0–44)
AST: 18 IU/L (ref 0–40)
Albumin/Globulin Ratio: 1.7 (ref 1.2–2.2)
Albumin: 4 g/dL (ref 3.7–4.7)
Alkaline Phosphatase: 51 IU/L (ref 44–121)
BUN/Creatinine Ratio: 16 (ref 10–24)
BUN: 23 mg/dL (ref 8–27)
Bilirubin Total: 0.4 mg/dL (ref 0.0–1.2)
CO2: 23 mmol/L (ref 20–29)
Calcium: 8.8 mg/dL (ref 8.6–10.2)
Chloride: 106 mmol/L (ref 96–106)
Creatinine, Ser: 1.47 mg/dL — ABNORMAL HIGH (ref 0.76–1.27)
Globulin, Total: 2.3 g/dL (ref 1.5–4.5)
Glucose: 99 mg/dL (ref 70–99)
Potassium: 4.6 mmol/L (ref 3.5–5.2)
Sodium: 142 mmol/L (ref 134–144)
Total Protein: 6.3 g/dL (ref 6.0–8.5)
eGFR: 48 mL/min/{1.73_m2} — ABNORMAL LOW (ref >59)

## 2021-10-07 LAB — ECHOCARDIOGRAM LIMITED
Area-P 1/2: 4.06 cm2
S' Lateral: 3.8 cm

## 2021-10-08 ENCOUNTER — Encounter: Payer: Self-pay | Admitting: Physician Assistant

## 2021-10-11 ENCOUNTER — Other Ambulatory Visit: Payer: Self-pay | Admitting: *Deleted

## 2021-10-11 DIAGNOSIS — R9430 Abnormal result of cardiovascular function study, unspecified: Secondary | ICD-10-CM

## 2021-10-11 DIAGNOSIS — I77810 Thoracic aortic ectasia: Secondary | ICD-10-CM

## 2021-10-11 DIAGNOSIS — I493 Ventricular premature depolarization: Secondary | ICD-10-CM

## 2021-10-22 ENCOUNTER — Ambulatory Visit (INDEPENDENT_AMBULATORY_CARE_PROVIDER_SITE_OTHER): Payer: Medicare PPO

## 2021-10-22 DIAGNOSIS — I639 Cerebral infarction, unspecified: Secondary | ICD-10-CM

## 2021-10-22 LAB — CUP PACEART REMOTE DEVICE CHECK
Date Time Interrogation Session: 20230102231048
Implantable Pulse Generator Implant Date: 20220610

## 2021-10-23 ENCOUNTER — Other Ambulatory Visit: Payer: Self-pay | Admitting: *Deleted

## 2021-10-23 DIAGNOSIS — R9430 Abnormal result of cardiovascular function study, unspecified: Secondary | ICD-10-CM

## 2021-10-23 DIAGNOSIS — I493 Ventricular premature depolarization: Secondary | ICD-10-CM

## 2021-10-23 DIAGNOSIS — I77810 Thoracic aortic ectasia: Secondary | ICD-10-CM

## 2021-10-23 NOTE — Progress Notes (Signed)
BMET order placed on this pt to have done at the lab at [facility] now. This was ordered per Richardson Dopp PA-C for upcoming CT and per protocol. BMET requisition form printed off and give to Thomes Dinning with [facility] Scheduling to fax to [facility] office in [place] to draw on pt now.

## 2021-10-24 ENCOUNTER — Telehealth: Payer: Self-pay | Admitting: Physician Assistant

## 2021-10-24 LAB — BASIC METABOLIC PANEL
BUN/Creatinine Ratio: 16 (ref 10–24)
BUN: 24 mg/dL (ref 8–27)
CO2: 23 mmol/L (ref 20–29)
Calcium: 8.7 mg/dL (ref 8.6–10.2)
Chloride: 106 mmol/L (ref 96–106)
Creatinine, Ser: 1.51 mg/dL — ABNORMAL HIGH (ref 0.76–1.27)
Glucose: 115 mg/dL — ABNORMAL HIGH (ref 70–99)
Potassium: 4.8 mmol/L (ref 3.5–5.2)
Sodium: 143 mmol/L (ref 134–144)
eGFR: 46 mL/min/{1.73_m2} — ABNORMAL LOW (ref >59)

## 2021-10-24 NOTE — Telephone Encounter (Signed)
New message   Patient's wife has my direct phone number because of a past test scheduled by me.  She called stating that Shane Welch has had frequent nosebleeds for the last couple of days.  He has had 3 today and is wondering if the eliquis has anything to do with it?  Please call.  Patient sees Dr Curt Bears, Dr Irish Lack and most recent [place], [place]. Not sure who prescribed the medication.

## 2021-10-24 NOTE — Telephone Encounter (Signed)
Spoke to wife, dpr on file. Discussed their call in concern. She reports that they are also taking with Dr. Marlis Edelson office about possible issues coming from Aricept. Advised that pt will need to follow up w/ Sethi/PCP to determine other possible causes of nose bleeds. She will let me know if worsens or they want to go ahead a switch DOACs, otherwise will keep follow up with Dr Elberta Fortis on 11/08/21 Wife appreciates my return call on this matter

## 2021-10-24 NOTE — Telephone Encounter (Signed)
Eliquis prescribed by EP team.  Will route to Dr. Gershon Crane nurse.

## 2021-10-28 ENCOUNTER — Encounter: Payer: Self-pay | Admitting: Ophthalmology

## 2021-10-30 ENCOUNTER — Telehealth: Payer: Self-pay | Admitting: Cardiology

## 2021-10-30 NOTE — Telephone Encounter (Addendum)
Spoke to wife, ok per pt. They  are going to go to urgent care this evening for evaluation. Will update [place] tomorrow on findings, if any, to determine if any changes need to be made to treatment plan. Patient verbalized understanding and agreeable to plan.

## 2021-10-30 NOTE — Telephone Encounter (Signed)
Returned call to patient regarding call from wife r/t recent nosebleeds. Spoke with pt's wife [name] (DPR on file) with patient present in background. Per wife, pt was placed on Eliquis in November (seen by Francis Dowse 08/20/21) and has begun to have nosebleeds for the last week and a half that are increasing in frequency. [name] (wife) states that pt had 4 separate episodes of nosebleed yesterday during day and 2 additional episodes during the night. At time of call today, pt has already had 2 episodes of nosebleed, with last occurring one hour ago (approx 10am). Wife states that the nosebleeds last between 5-10 minutes, with the longest episode lasting approx 15 minutes. Wife is concerned that pt needs to be switched off of Eliquis. Pt denies any cold symptoms or sinus issues. Pt also taking Midodrine for his hypotension, which is causing more concern for wife. Will route to Francis Dowse for plan of action. Triage will f/u with patient. Maralyn Sago, RN

## 2021-10-30 NOTE — Telephone Encounter (Incomplete Revision)
Spoke to wife  They  are going to go to urgent care this evening for evaluation. Will update [place] tomorrow on findings, if any, to determine if any changes need to be made to treatment plan. Wife is agreeable to plan

## 2021-10-30 NOTE — Telephone Encounter (Signed)
° °  Pt's wife calling, she said pt been having nose bleed and would like to speak with a nurse

## 2021-10-30 NOTE — Progress Notes (Signed)
Carelink Summary Report / Loop Recorder 

## 2021-11-05 ENCOUNTER — Ambulatory Visit (INDEPENDENT_AMBULATORY_CARE_PROVIDER_SITE_OTHER)
Admission: RE | Admit: 2021-11-05 | Discharge: 2021-11-05 | Disposition: A | Discharge status: Home / Self Care | Payer: Medicare PPO | Source: Ambulatory Visit | Attending: Physician Assistant | Admitting: Physician Assistant

## 2021-11-05 ENCOUNTER — Other Ambulatory Visit: Payer: Self-pay

## 2021-11-05 DIAGNOSIS — R9430 Abnormal result of cardiovascular function study, unspecified: Secondary | ICD-10-CM | POA: Diagnosis not present

## 2021-11-05 DIAGNOSIS — I77810 Thoracic aortic ectasia: Secondary | ICD-10-CM

## 2021-11-05 DIAGNOSIS — I493 Ventricular premature depolarization: Secondary | ICD-10-CM | POA: Diagnosis not present

## 2021-11-05 MED ORDER — IOHEXOL 350 MG/ML SOLN
100.0000 mL | Freq: Once | INTRAVENOUS | Status: AC | PRN
  Administered 2021-11-05: 100 mL via INTRAVENOUS

## 2021-11-07 ENCOUNTER — Other Ambulatory Visit (HOSPITAL_COMMUNITY): Payer: Medicare PPO

## 2021-11-08 ENCOUNTER — Encounter: Payer: Self-pay | Admitting: Cardiology

## 2021-11-08 ENCOUNTER — Ambulatory Visit (INDEPENDENT_AMBULATORY_CARE_PROVIDER_SITE_OTHER): Payer: Medicare PPO | Admitting: Cardiology

## 2021-11-08 ENCOUNTER — Other Ambulatory Visit: Payer: Self-pay

## 2021-11-08 VITALS — BP 150/68 | HR 55 | Ht 71.0 in | Wt 205.4 lb

## 2021-11-08 DIAGNOSIS — R9389 Abnormal findings on diagnostic imaging of other specified body structures: Secondary | ICD-10-CM

## 2021-11-08 DIAGNOSIS — I493 Ventricular premature depolarization: Secondary | ICD-10-CM

## 2021-11-08 DIAGNOSIS — I7121 Aneurysm of the ascending aorta, without rupture: Secondary | ICD-10-CM

## 2021-11-08 MED ORDER — MEXILETINE HCL 150 MG PO CAPS: 150.0000 mg | ORAL_CAPSULE | Freq: Two times a day (BID) | ORAL | 3 refills | Status: DC

## 2021-11-08 MED ORDER — RIVAROXABAN 20 MG PO TABS: 20.0000 mg | ORAL_TABLET | Freq: Every day | ORAL | 6 refills | Status: DC

## 2021-11-08 NOTE — Patient Instructions (Addendum)
Medication Instructions:  Your physician has recommended you make the following change in your medication:  STOP Eliquis START Xarelto 20 mg once daily  START Mexiletine 250 mg twice daily   *If you need a refill on your cardiac medications before your next appointment, please call your pharmacy*   Lab Work: None ordered   Testing/Procedures: None ordered   Follow-Up: At [facility], you and your health needs are our priority.  As part of our continuing mission to provide you with exceptional heart care, we have created designated Provider Care Teams.  These Care Teams include your primary Cardiologist (physician) and Advanced Practice Providers (APPs -  Physician Assistants and Nurse Practitioners) who all work together to provide you with the care you need, when you need it.  We recommend signing up for the patient portal called "MyChart".  Sign up information is provided on this After Visit Summary.  MyChart is used to connect with patients for Virtual Visits (Telemedicine).  Patients are able to view lab/test results, encounter notes, upcoming appointments, etc.  Non-urgent messages can be sent to your provider as well.   To learn more about what you can do with MyChart, go to [email or web address].    Your next appointment:   3 month(s)  The format for your next appointment:   In Person  Provider:   Loman Brooklyn, MD  You have been referred to pulmonology for the abnormal CT findings   Thank you for choosing [facility]!!   Dory Horn, RN [phone]   Other Instructions

## 2021-11-08 NOTE — Progress Notes (Signed)
Electrophysiology Office Note   Date:  11/08/2021   ID:  Shane, Welch 03/23/1941, MRN [id number]  PCP:  Leonard Downing, MD  Cardiologist:   Primary Electrophysiologist:  Raylee Adamec Meredith Leeds, MD    Chief Complaint: CVA   History of Present Illness: Shane Welch is a 81 y.o. male who is being seen today for the evaluation of CVA at the request of Leonard Downing, *. Presenting today for electrophysiology evaluation.  He has a history significant type 2 diabetes, hypertension, hyperlipidemia, coronary artery disease.  He presented to the emergency room with a fall and was found to have an acute right occipital lobe stroke.  He was placed on aspirin and Plavix as well as Lipitor.  He is now status post Linq monitor implant.  Today, denies symptoms of palpitations, chest pain, shortness of breath, orthopnea, PND, lower extremity edema, claudication, dizziness, presyncope, syncope, bleeding, or neurologic sequela. The patient is tolerating medications without difficulties.  Currently he feels fatigued and weak.  He is unaware of atrial fibrillation.  He he also notes that his heart rate has been irregular.  He has no chest pain or shortness of breath.  He does find it somewhat difficult to do his daily activities.   Past Medical History:  Diagnosis Date   CAD (coronary artery disease) 12/10/2015   Ant MI 2/17>>DES to LAD // Myoview 12/22: EF 37, no ischemia (int risk)   CKD (chronic kidney disease), stage III ([place]) 10/01/2021   Diabetes ([place]) 12/10/2015   Dilated aortic root ([place]) 09/06/2018   Echo 6/17: 42 mm // Echo 10/2018: 43 mm // Echocardiogram 3/22: 42 mm // Echo 12/22: 44 mm   Essential hypertension 09/03/2017   Hyperlipidemia    Ischemic cardiomyopathy    s/p Ant MI 2/17 - Echo EF 40-45 // Echo 6/17: EF 55 // Echo 10/2018: EF 50 // Echo 3/22: EF 50-55   Paroxysmal atrial fibrillation ([place]) 09/30/2021   S/p CVA in 3/22 >> ILR // AFib IDd on ILR>>anticoagulation  started   PVC's (premature ventricular contractions) 09/30/2021   Monitor 9/22: PVCs 33% // Metoprolol succ started // ILR interr 11/22: PVCs 22.5% // Echo 12/22: Frequent PVCs; EF 45-50, global HK, mild concentric LVH, normal RVSF, trivial MR, trivial AI, aortic root 44 mm   s/p Ant MI in 2017 tx with DES to LAD 03/11/2016   Stroke ([place]) 01/07/2021   Type II diabetes mellitus ([place])    a. Dx ~ 2010.   Past Surgical History:  Procedure Laterality Date   CARDIAC CATHETERIZATION N/A 12/08/2015   Procedure: Left Heart Cath and Coronary Angiography;  Surgeon: Jettie Booze, MD;  Location: [place] [facility];  Service: Cardiovascular;  Laterality: N/A;   CARDIAC CATHETERIZATION  12/08/2015   Procedure: Coronary Stent Intervention;  Surgeon: Jettie Booze, MD;  Location: [place] [facility];  Service: Cardiovascular;;   CERVICAL SPINE SURGERY     a. ~ 2002.   Right Knee Surgery     a. 08/2015.     Current Outpatient Medications  Medication Sig Dispense Refill   acetaminophen (TYLENOL) 325 MG tablet Take 650 mg by mouth every 6 (six) hours as needed for mild pain, moderate pain or headache.     donepezil (ARICEPT) 10 MG tablet Take 1 tablet (10 mg total) by mouth at bedtime. Start 1/2 tablet daily x 4 weeks and then 1 tablet daily 30 tablet 3   finasteride (PROSCAR) 5 MG tablet Take 1 tablet by  mouth daily.     glipiZIDE (GLUCOTROL XL) 5 MG 24 hr tablet Take 5 mg by mouth daily with breakfast.     metFORMIN (GLUCOPHAGE) 1000 MG tablet Take 1 tablet (1,000 mg total) by mouth 2 (two) times daily with a meal. 60 tablet 3   metoprolol succinate (TOPROL XL) 25 MG 24 hr tablet Take 1 tablet (25 mg total) by mouth at bedtime. 90 tablet 3   mexiletine (MEXITIL) 150 MG capsule Take 1 capsule (150 mg total) by mouth 2 (two) times daily. 60 capsule 3   midodrine (PROAMATINE) 2.5 MG tablet Take 1 tablet (2.5 mg total) by mouth 3 (three) times daily with meals. 30 tablet 0   rivaroxaban (XARELTO)  20 MG TABS tablet Take 1 tablet (20 mg total) by mouth daily with supper. 30 tablet 6   rosuvastatin (CRESTOR) 20 MG tablet Take 1 tablet (20 mg total) by mouth daily. 90 tablet 3   tamsulosin (FLOMAX) 0.4 MG CAPS capsule      No current facility-administered medications for this visit.    Allergies:   Sulfa antibiotics   Social History:  The patient  reports that he has quit smoking. His smoking use included cigarettes. He has a 30.00 pack-year smoking history. He has never used smokeless tobacco. He reports current alcohol use. He reports that he does not use drugs.   Family History:  The patient's family history includes Cancer in his father; Dementia in his mother.   ROS:  Please see the history of present illness.   Otherwise, review of systems is positive for none.   All other systems are reviewed and negative.   PHYSICAL EXAM: VS:  BP (!) 150/68    Pulse (!) 55    Ht 5\' 11"  (1.803 m)    Wt 205 lb 6.4 oz (93.2 kg)    SpO2 96%    BMI 28.65 kg/m  , BMI Body mass index is 28.65 kg/m. GEN: Well nourished, well developed, in no acute distress  HEENT: normal  Neck: no JVD, carotid bruits, or masses Cardiac: RRR; no murmurs, rubs, or gallops,no edema  Respiratory:  clear to auscultation bilaterally, normal work of breathing GI: soft, nontender, nondistended, + BS MS: no deformity or atrophy  Skin: warm and dry Neuro:  Strength and sensation are intact Psych: euthymic mood, full affect  EKG:  EKG is not ordered today. Personal review of the ekg ordered 10/01/21 shows sinus rhythm, PVCs, rate 76  Recent Labs: 02/19/2021: TSH 3.110 08/20/2021: Hemoglobin 14.6; Platelets 206 10/07/2021: ALT 14 10/23/2021: BUN 24; Creatinine, Ser 1.51; Potassium 4.8; Sodium 143    Lipid Panel     Component Value Date/Time   CHOL 102 10/07/2021 1024   TRIG 87 10/07/2021 1024   HDL 42 10/07/2021 1024   CHOLHDL 2.4 10/07/2021 1024   CHOLHDL 5.6 01/08/2021 0630   VLDL 33 01/08/2021 0630   LDLCALC 43  10/07/2021 1024     Wt Readings from Last 3 Encounters:  11/08/21 205 lb 6.4 oz (93.2 kg)  10/01/21 205 lb 12.8 oz (93.4 kg)  09/25/21 210 lb (95.3 kg)      Other studies Reviewed: Additional studies/ records that were reviewed today include: TTE 10/07/21  Review of the above records today demonstrates:   1. Very frequent PVCs during study   2. Difficult to assess LVEF and wall motion due to very frequent ectopy.  Left ventricular ejection fraction, by estimation, is 45 to 50%. The left  ventricle has mildly  decreased function. There appears to be global  hypokinesis. There is mild concentric  left ventricular hypertrophy.   3. Right ventricular systolic function is normal. The right ventricular  size is normal.   4. The mitral valve is normal in structure. Trivial mitral valve  regurgitation.   5. Aortic valve regurgitation is trivial.   6. Aortic dilatation noted. There is moderate dilatation of the aortic  root, measuring 44 mm.    ASSESSMENT AND PLAN:  1.  Cryptogenic stroke: Status post Linq monitor implant.  Currently on Eliquis.  2.  Coronary artery disease: Status post non-STEMI with drug-eluting stent to the LAD in 2017.  Echo with an ejection fraction 45 to 50%.  Continue with management per primary cardiology.  3.  Paroxysmal atrial fibrillation: CHA2DS2-VASc of 7.  Currently on Eliquis.  Was started on Eliquis but is now having nosebleeds.  We Canda Podgorski stop Eliquis and start Xarelto 20 mg.  4.  PVCs: Elevated burden at 19% on Linq monitor.  He is feeling weak and fatigued.  This could be due to his elevated PVC burden.  He is interested in suppression.  We Amaiah Cristiano start mexiletine 250 mg twice daily.  5.  Aortic aneurysm with atherosclerosis: Lipid management followed by primary cardiology.  Found on CT scan.  Asymptomatic.  6.  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  Shalik Sanfilippo refer to pulmonology.  Current medicines are reviewed at length with the patient today.   The patient  does not have concerns regarding his medicines.  The following changes were made today: Stop Eliquis, start mexiletine, Xarelto  Labs/ tests ordered today include:  Orders Placed This Encounter  Procedures   Ambulatory referral to Pulmonology      Disposition:   FU with Shakera Ebrahimi 3 months  Signed, Colbey Wirtanen Meredith Leeds, MD  11/08/2021 2:27 PM     [facility] [address] [place] [address] [phone] (office) [phone] (fax)

## 2021-11-11 ENCOUNTER — Ambulatory Visit: Payer: Medicare PPO | Admitting: Neurology

## 2021-11-11 ENCOUNTER — Other Ambulatory Visit: Payer: Self-pay

## 2021-11-11 ENCOUNTER — Encounter: Payer: Self-pay | Admitting: Neurology

## 2021-11-11 VITALS — BP 118/68 | HR 80 | Ht 71.0 in | Wt 205.0 lb

## 2021-11-11 DIAGNOSIS — G3184 Mild cognitive impairment, so stated: Secondary | ICD-10-CM

## 2021-11-11 DIAGNOSIS — R413 Other amnesia: Secondary | ICD-10-CM

## 2021-11-11 MED ORDER — DONEPEZIL HCL 23 MG PO TABS: 23.0000 mg | ORAL_TABLET | Freq: Every day | ORAL | 3 refills | Status: DC

## 2021-11-11 NOTE — Discharge Instructions (Signed)

## 2021-11-11 NOTE — Progress Notes (Signed)
[facility] [address] [place]. [place] [address] [phone]       OFFICE FOLLOW UP VISIT NOTE  [name] Date of Birth:  1941/02/05 Medical Record Number:  [id number]   Referring MD: Windle Guard Reason for Referral: Stroke HPI: Initial visit 02/19/2021: Shane Welch is a pleasant 81 Caucasian male seen today for initial office consultation visit for stroke.  He is accompanied by his wife [name] and history is obtained from them and review of electronic medical records and I personally reviewed available imaging films in PACS.  He has past medical history of diabetes, hypertension, hyperlipidemia and coronary artery disease.  He was seen on 01/07/2021 at [facility].  He was sitting on a stool in his kitchen table when if fell down.  He is unable to get up and was off balance.  He was also confused and disoriented.  He stated he was seeing black spots in front of both eyes.  He was brought in by EMS and was quite agitated when he came in.  Patient states he has no memory of his 5-day hospitalization.  He was seen on arrival by telemetry neurologist who thought he had a TIA and NIH stroke scale on admission was 0.  CT scan of the head on admission was unremarkable and subsequent hospital admission and MRI and further work-up was suggested.  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  2D echo showed normal ejection fraction without cardiac source of embolism.  LDL cholesterol was 1 1 3  mg percent.  Hemoglobin A1c was 6.8.  Urine drug screen was negative.  Patient was discharged on aspirin and Plavix for 3 weeks and subsequently Plavix alone.  Patient states he still sees occasional black spots in front of his eyes but his vision is mostly  improved.  He is also improved in his coordination and balance.  He however has noticed worsening of his baseline memory and cognitive difficulties which he was actually having for the last 5 6 years ago and before his stroke.  He also complains of feeling tired and having low energy.  Patient was started on Lipitor but his primary care physician has changed him to Crestor because of urinary retention.  Patient does have family history of Alzheimer's in his mother and worries about it.  He is tolerating Plavix well without bruising or bleeding.  His blood pressure is well controlled today slightly elevated in office at 146/95.  He was having some orthostatic dizziness but he has been started on midodrine which seems to have helped.  He has no prior history of strokes TIA seizures prior neurological problems.  He denies any history of atrial fibrillation, cardiac arrhythmias but does have history of coronary artery disease and is undergone stenting by Dr. Eldridge Dace. Update 07/09/2021 : He returns for follow-up after last visit 4 months ago.  He is accompanied by his wife.  She states that his memory is not better.  He continues to have short-term memory difficulties and trouble remembering recent information.  He struggles with calculations and with numbers.  He still mostly independent but wife has to keep a close watch on him.  He denies any symptoms of delusion, hallucinations, unsafe behavior.  His gait and balance are good with no  falls or injuries.  He has been having some bradycardia and has an appointment upcoming to see cardiologist Dr. Elberta Fortis for this.  He has finished working with physical therapy for his balance.  He had lab work for reversible causes of memory loss on 02/19/2021 all of which were normal.  EEG on 03/08/2019 showed mild generalized slowing without epileptiform activity.  He had a loop recorder inserted and so for paroxysmal A. fib has not yet been found.  On Mini-Mental status exam testing  today scored 24/30.  He has s was not found to be depressed on geriatric depression scale.   Update 11/11/2021: He returns for follow-up after last visit 4 months ago.  He is accompanied by his wife.  He states he noticed improvement in his cognitive difficulties after starting Aricept and was tolerating 10 mg daily quite well but then after the first few weeks of improvement his benefit plateaued out.  He however started having some nasal bleeding and he felt this may have been related to Aricept so he reduced the dose to 5 mg.  However his cardiologist recently switched Eliquis to Xarelto last Friday and so far is tolerated well without any recurrence of nasal bleeding.  Patient has not had any recurrent stroke or TIA symptoms.  He had lipid profile on 10/07/2021 with LDL cholesterol being optimal at 43 mg percent.  On Mini-Mental status exam today scored 24/30 on recall 1/3 and able to name 9 animals which can walk on 4 legs.  On the clock drawing he scored 3/4.  This appears unchanged compared to last visit ROS:   14 system review of systems is positive for nasal bleeding,, headache, calculation difficonfusion, disorientation, memory loss, decreased energy, fatigue, tiredness all other systems negative  PMH:  Past Medical History:  Diagnosis Date   CAD (coronary artery disease) 12/10/2015   Ant MI 2/17>>DES to LAD // Myoview 12/22: EF 37, no ischemia (int risk)   CKD (chronic kidney disease), stage III (HCC) 10/01/2021   Diabetes (HCC) 12/10/2015   Dilated aortic root (HCC) 09/06/2018   Echo 6/17: 42 mm // Echo 10/2018: 43 mm // Echocardiogram 3/22: 42 mm // Echo 12/22: 44 mm   Essential hypertension 09/03/2017   Hyperlipidemia    Ischemic cardiomyopathy    s/p Ant MI 2/17 - Echo EF 40-45 // Echo 6/17: EF 55 // Echo 10/2018: EF 50 // Echo 3/22: EF 50-55   Paroxysmal atrial fibrillation (HCC) 09/30/2021   S/p CVA in 3/22 >> ILR // AFib IDd on ILR>>anticoagulation started   PVC's (premature  ventricular contractions) 09/30/2021   Monitor 9/22: PVCs 33% // Metoprolol succ started // ILR interr 11/22: PVCs 22.5% // Echo 12/22: Frequent PVCs; EF 45-50, global HK, mild concentric LVH, normal RVSF, trivial MR, trivial AI, aortic root 44 mm   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 Dx ~ 2010.    Social History:  Social History   Socioeconomic History   Marital status: Married    Spouse name: [name]   Number of children: Not on file   Years of education: Not on file   Highest education level: Not on file  Occupational History   Not on file  Tobacco Use   Smoking status: Former    Packs/day: 1.00    Years: 30.00    Pack years: 30.00    Types: Cigarettes  Smokeless tobacco: Never   Tobacco comments:    quit 1993  Vaping Use   Vaping Use: Never used  Substance and Sexual Activity   Alcohol use: Yes    Alcohol/week: 0.0 standard drinks    Comment: rare drink   Drug use: No   Sexual activity: Not on file  Other Topics Concern   Not on file  Social History Narrative   Lives with wife and son   Right Handed   Drinks 2-3 cups caffeine daily   Social Determinants of Health   Financial Resource Strain: Not on file  Food Insecurity: Not on file  Transportation Needs: Not on file  Physical Activity: Not on file  Stress: Not on file  Social Connections: Not on file  Intimate Partner Violence: Not on file    Medications:   Current Outpatient Medications on File Prior to Visit  Medication Sig Dispense Refill   acetaminophen (TYLENOL) 325 MG tablet Take 650 mg by mouth every 6 (six) hours as needed for mild pain, moderate pain or headache.     donepezil (ARICEPT) 10 MG tablet Take 1 tablet (10 mg total) by mouth at bedtime. Start 1/2 tablet daily x 4 weeks and then 1 tablet daily 30 tablet 3   finasteride (PROSCAR) 5 MG tablet Take 1 tablet by mouth daily.     glipiZIDE (GLUCOTROL XL) 5 MG 24  hr tablet Take 5 mg by mouth daily with breakfast.     metFORMIN (GLUCOPHAGE) 1000 MG tablet Take 1 tablet (1,000 mg total) by mouth 2 (two) times daily with a meal. 60 tablet 3   metoprolol succinate (TOPROL XL) 25 MG 24 hr tablet Take 1 tablet (25 mg total) by mouth at bedtime. 90 tablet 3   mexiletine (MEXITIL) 150 MG capsule Take 1 capsule (150 mg total) by mouth 2 (two) times daily. 60 capsule 3   midodrine (PROAMATINE) 2.5 MG tablet Take 1 tablet (2.5 mg total) by mouth 3 (three) times daily with meals. 30 tablet 0   rivaroxaban (XARELTO) 20 MG TABS tablet Take 1 tablet (20 mg total) by mouth daily with supper. 30 tablet 6   rosuvastatin (CRESTOR) 20 MG tablet Take 1 tablet (20 mg total) by mouth daily. 90 tablet 3   tamsulosin (FLOMAX) 0.4 MG CAPS capsule      No current facility-administered medications on file prior to visit.    Allergies:   Allergies  Allergen Reactions   Sulfa Antibiotics Hives    Physical Exam General: well developed, well nourished elderly Caucasian male, seated, in no evident distress Head: head normocephalic and atraumatic.   Neck: supple with no carotid or supraclavicular bruits Cardiovascular: regular rate and rhythm, no murmurs Musculoskeletal: no deformity Skin:  no rash/petichiae Vascular:  Normal pulses all extremities  Neurologic Exam Mental Status: Awake and fully alert. Oriented to place and time. Recent and remote memory diminished. Attention span, concentration and fund of knowledge appropriate. Mood and affect appropriate.  Recall 1/3.  Able to name 9 animals which can walk on 4 legs.  Clock drawing 3/4.  Mini-Mental status exam score 24/30.  Geriatric depression scale not depressed.. Cranial Nerves: Fundoscopic exam not done s. Pupils equal, briskly reactive to light. Extraocular movements full without nystagmus. Visual fields full to confrontation. Hearing diminished bilaterally. Facial sensation intact. Face, tongue, palate moves normally  and symmetrically.  Motor: Normal bulk and tone. Normal strength in all tested extremity muscles. Sensory.: intact to touch , pinprick , position and vibratory sensation.  Coordination: Rapid alternating movements normal in all extremities. Finger-to-nose and heel-to-shin performed accurately bilaterally. Gait and Station: Arises from chair without difficulty. Stance is normal. Gait demonstrates normal stride length and balance . Able to heel, toe and tandem walk with slight difficulty.  Reflexes: 1+ and symmetric except both ankle jerks are depressed slightly.. Toes downgoing.   NIHSS  0 Modified Rankin 1 MMSE - Mini Mental State Exam 07/09/2021  Orientation to time 2  Orientation to Place 5  Registration 3  Attention/ Calculation 5  Recall 0  Language- name 2 objects 2  Language- repeat 1  Language- follow 3 step command 3  Language- read & follow direction 1  Write a sentence 1  Copy design 1  Copy design-comments 4 legged animals x 1 minute: 11  Total score 24    MMSE - Mini Mental State Exam 11/11/2021 07/09/2021  Orientation to time 5 2  Orientation to Place 5 5  Registration 3 3  Attention/ Calculation 2 5  Recall 1 0  Language- name 2 objects 2 2  Language- repeat 1 1  Language- follow 3 step command 3 3  Language- read & follow direction 1 1  Write a sentence 1 1  Copy design 0 1  Copy design-comments - 4 legged animals x 1 minute: 11  Total score 24 24     ASSESSMENT: 81 year old Caucasian male with 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  Vascular risk factors of coronary artery disease, diabetes, hypertension, hyperlipidemia.  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  I recommend he increase the dose to 10 mg daily for a month and then switch to Aricept 23 mg daily if tolerated without side effects.  He was advised to increase participation in cognitively challenging activities like solving crossword puzzles, playing bridge and sodoku.  We also discussed memory compensation strategies.  He will continue Xarelto for stroke prevention and maintain aggressive risk factor modification strict control of hypertension with blood pressure goal below 130/90, lipids with LDL cholesterol goal below 70 mg percent and diabetes with hemoglobin A1c goal below 6.5%.  He will return for follow-up in the future in 4 months or call earlier if necessary.Greater than 50% time during this 30-minute  visit was spent on counseling and coordination of care about his embolic occipital stroke as well as discussion about mild cognitive impairment and answering questions. Antony Contras, MD Note: This document was prepared with digital dictation and possible smart phrase technology. Any transcriptional errors that result from this process are unintentional.

## 2021-11-11 NOTE — Patient Instructions (Signed)
I had a long discussion with patient and his wife regarding his memory loss and cognitive impairment which seem to have responded initially to Aricept but he had some trouble with nasal bleeding and he had initially thought this was related to the Aricept and reduce the dose to 5 mg daily.  I recommend he increase the dose to 10 mg daily for a month and then switch to Aricept 23 mg daily if tolerated without side effects.  He was advised to increase participation in cognitively challenging activities like solving crossword puzzles, playing bridge and sodoku.  We also discussed memory compensation strategies.  He will continue Xarelto for stroke prevention and maintain aggressive risk factor modification strict control of hypertension with blood pressure goal below 130/90, lipids with LDL cholesterol goal below 70 mg percent and diabetes with hemoglobin A1c goal below 6.5%.  He will return for follow-up in the future in 4 months or call earlier if necessary. Memory Compensation Strategies  Use "WARM" strategy.  W= write it down  A= associate it  R= repeat it  M= make a mental note  2.   You can keep a Social worker.  Use a 3-ring notebook with sections for the following: calendar, important names and phone numbers,  medications, doctors' names/phone numbers, lists/reminders, and a section to journal what you did  each day.   3.    Use a calendar to write appointments down.  4.    Write yourself a schedule for the day.  This can be placed on the calendar or in a separate section of the Memory Notebook.  Keeping a  regular schedule can help memory.  5.    Use medication organizer with sections for each day or morning/evening pills.  You may need help loading it  6.    Keep a basket, or pegboard by the door.  Place items that you need to take out with you in the basket or on the pegboard.  You may also want to  include a message board for reminders.  7.    Use sticky notes.  Place sticky notes  with reminders in a place where the task is performed.  For example: " turn off the  stove" placed by the stove, "lock the door" placed on the door at eye level, " take your medications" on  the bathroom mirror or by the place where you normally take your medications.  8.    Use alarms/timers.  Use while cooking to remind yourself to check on food or as a reminder to take your medicine, or as a  reminder to make a call, or as a reminder to perform another task, etc.

## 2021-11-13 ENCOUNTER — Other Ambulatory Visit: Payer: Self-pay

## 2021-11-13 ENCOUNTER — Encounter
Admission: RE | Disposition: A | Discharge status: Home / Self Care | Payer: Self-pay | Source: Home / Self Care | Attending: Ophthalmology

## 2021-11-13 ENCOUNTER — Ambulatory Visit
Admission: RE | Admit: 2021-11-13 | Discharge: 2021-11-13 | Disposition: A | Discharge status: Home / Self Care | Payer: Medicare PPO | Attending: Ophthalmology | Admitting: Ophthalmology

## 2021-11-13 ENCOUNTER — Encounter: Payer: Self-pay | Admitting: Ophthalmology

## 2021-11-13 ENCOUNTER — Ambulatory Visit: Payer: Medicare PPO | Admitting: Anesthesiology

## 2021-11-13 DIAGNOSIS — I129 Hypertensive chronic kidney disease with stage 1 through stage 4 chronic kidney disease, or unspecified chronic kidney disease: Secondary | ICD-10-CM | POA: Diagnosis not present

## 2021-11-13 DIAGNOSIS — H2512 Age-related nuclear cataract, left eye: Secondary | ICD-10-CM | POA: Insufficient documentation

## 2021-11-13 DIAGNOSIS — N183 Chronic kidney disease, stage 3 unspecified: Secondary | ICD-10-CM | POA: Insufficient documentation

## 2021-11-13 DIAGNOSIS — E1122 Type 2 diabetes mellitus with diabetic chronic kidney disease: Secondary | ICD-10-CM | POA: Diagnosis not present

## 2021-11-13 DIAGNOSIS — I2510 Atherosclerotic heart disease of native coronary artery without angina pectoris: Secondary | ICD-10-CM | POA: Insufficient documentation

## 2021-11-13 DIAGNOSIS — Z87891 Personal history of nicotine dependence: Secondary | ICD-10-CM | POA: Insufficient documentation

## 2021-11-13 DIAGNOSIS — E1136 Type 2 diabetes mellitus with diabetic cataract: Secondary | ICD-10-CM | POA: Diagnosis not present

## 2021-11-13 DIAGNOSIS — I252 Old myocardial infarction: Secondary | ICD-10-CM | POA: Insufficient documentation

## 2021-11-13 HISTORY — PX: CATARACT EXTRACTION W/PHACO: SHX586

## 2021-11-13 LAB — GLUCOSE, CAPILLARY: Glucose-Capillary: 108 mg/dL — ABNORMAL HIGH (ref 70–99)

## 2021-11-13 SURGERY — PHACOEMULSIFICATION, CATARACT, WITH IOL INSERTION
Anesthesia: Monitor Anesthesia Care | Site: Eye | Laterality: Left

## 2021-11-13 MED ORDER — TETRACAINE HCL 0.5 % OP SOLN
1.0000 [drp] | OPHTHALMIC | Status: DC | PRN
  Administered 2021-11-13 (×3): 1 [drp] via OPHTHALMIC

## 2021-11-13 MED ORDER — CEFUROXIME OPHTHALMIC INJECTION 1 MG/0.1 ML
INJECTION | OPHTHALMIC | Status: DC | PRN
  Administered 2021-11-13: 1 mg via OPHTHALMIC

## 2021-11-13 MED ORDER — SIGHTPATH DOSE#1 BSS IO SOLN
INTRAOCULAR | Status: DC | PRN
  Administered 2021-11-13: 1 mL via INTRAMUSCULAR

## 2021-11-13 MED ORDER — SIGHTPATH DOSE#1 BSS IO SOLN
INTRAOCULAR | Status: DC | PRN
  Administered 2021-11-13: 13:00:00 89 mL via OPHTHALMIC

## 2021-11-13 MED ORDER — SIGHTPATH DOSE#1 BSS IO SOLN
INTRAOCULAR | Status: DC | PRN
  Administered 2021-11-13: 15 mL

## 2021-11-13 MED ORDER — ARMC OPHTHALMIC DILATING DROPS
1.0000 "application " | OPHTHALMIC | Status: DC | PRN
  Administered 2021-11-13 (×3): 1 via OPHTHALMIC

## 2021-11-13 MED ORDER — BRIMONIDINE TARTRATE-TIMOLOL 0.2-0.5 % OP SOLN
OPHTHALMIC | Status: DC | PRN
  Administered 2021-11-13: 1 [drp] via OPHTHALMIC

## 2021-11-13 MED ORDER — SIGHTPATH DOSE#1 NA HYALUR & NA CHOND-NA HYALUR IO KIT
PACK | INTRAOCULAR | Status: DC | PRN
  Administered 2021-11-13: 1 via OPHTHALMIC

## 2021-11-13 MED ORDER — ONDANSETRON HCL 4 MG/2ML IJ SOLN: 4.0000 mg | Freq: Once | INTRAMUSCULAR | Status: DC | PRN

## 2021-11-13 MED ORDER — MIDAZOLAM HCL 2 MG/2ML IJ SOLN
INTRAMUSCULAR | Status: DC | PRN
  Administered 2021-11-13: 1 mg via INTRAVENOUS

## 2021-11-13 MED ORDER — ACETAMINOPHEN 160 MG/5ML PO SOLN: 325.0000 mg | ORAL | Status: DC | PRN

## 2021-11-13 MED ORDER — ACETAMINOPHEN 325 MG PO TABS: 325.0000 mg | ORAL_TABLET | ORAL | Status: DC | PRN

## 2021-11-13 MED ORDER — FENTANYL CITRATE (PF) 100 MCG/2ML IJ SOLN
INTRAMUSCULAR | Status: DC | PRN
Start: 2021-11-13 — End: 2021-11-13
  Administered 2021-11-13: 50 ug via INTRAVENOUS

## 2021-11-13 SURGICAL SUPPLY — 11 items
CATARACT SUITE SIGHTPATH (MISCELLANEOUS) ×2 IMPLANT
FEE CATARACT SUITE SIGHTPATH (MISCELLANEOUS) ×2 IMPLANT
GLOVE SRG 8 PF TXTR STRL LF DI (GLOVE) ×2 IMPLANT
GLOVE SURG ENC TEXT LTX SZ7.5 (GLOVE) ×3 IMPLANT
GLOVE SURG UNDER POLY LF SZ8 (GLOVE) ×2
LENS IOL TECNIS EYHANCE 20.0 (Intraocular Lens) ×1 IMPLANT
NDL FILTER BLUNT 18X1 1/2 (NEEDLE) ×2 IMPLANT
NEEDLE FILTER BLUNT 18X 1/2SAF (NEEDLE) ×1
NEEDLE FILTER BLUNT 18X1 1/2 (NEEDLE) ×1 IMPLANT
SYR 3ML LL SCALE MARK (SYRINGE) ×3 IMPLANT
WATER STERILE IRR 250ML POUR (IV SOLUTION) ×3 IMPLANT

## 2021-11-13 NOTE — Anesthesia Preprocedure Evaluation (Signed)
Anesthesia Evaluation  Patient identified by MRN, date of birth, ID band Patient awake    Reviewed: Allergy & Precautions, H&P , NPO status , Patient's Chart, lab work & pertinent test results  Airway Mallampati: II  TM Distance: >3 FB Neck ROM: full    Dental no notable dental hx.    Pulmonary neg pulmonary ROS, former smoker,    Pulmonary exam normal        Cardiovascular hypertension, On Medications + CAD and + Past MI  Normal cardiovascular exam Rhythm:regular Rate:Normal     Neuro/Psych TIAnegative psych ROS   GI/Hepatic negative GI ROS, Neg liver ROS,   Endo/Other  diabetes, Well Controlled, Type 2  Renal/GU   negative genitourinary   Musculoskeletal   Abdominal   Peds  Hematology negative hematology ROS (+)   Anesthesia Other Findings   Reproductive/Obstetrics                             Anesthesia Physical Anesthesia Plan  ASA: 3  Anesthesia Plan: MAC   Post-op Pain Management:    Induction:   PONV Risk Score and Plan: 1 and TIVA, Midazolam and Treatment may vary due to age or medical condition  Airway Management Planned:   Additional Equipment:   Intra-op Plan:   Post-operative Plan:   Informed Consent: I have reviewed the patients History and Physical, chart, labs and discussed the procedure including the risks, benefits and alternatives for the proposed anesthesia with the patient or authorized representative who has indicated his/her understanding and acceptance.       Plan Discussed with:   Anesthesia Plan Comments:         Anesthesia Quick Evaluation

## 2021-11-13 NOTE — H&P (Signed)
[facility]   Primary Care Physician:  Leonard Downing, MD Ophthalmologist: Dr. Leandrew Koyanagi  Pre-Procedure History & Physical: HPI:  Shane Welch is a 81 y.o. male here for ophthalmic surgery.   Past Medical History:  Diagnosis Date   CAD (coronary artery disease) 12/10/2015   Ant MI 2/17>>DES to LAD // Myoview 12/22: EF 37, no ischemia (int risk)   CKD (chronic kidney disease), stage III ([place]) 10/01/2021   Diabetes ([place]) 12/10/2015   Dilated aortic root ([place]) 09/06/2018   Echo 6/17: 42 mm // Echo 10/2018: 43 mm // Echocardiogram 3/22: 42 mm // Echo 12/22: 44 mm   Essential hypertension 09/03/2017   Hyperlipidemia    Ischemic cardiomyopathy    s/p Ant MI 2/17 - Echo EF 40-45 // Echo 6/17: EF 55 // Echo 10/2018: EF 50 // Echo 3/22: EF 50-55   Paroxysmal atrial fibrillation ([place]) 09/30/2021   S/p CVA in 3/22 >> ILR // AFib IDd on ILR>>anticoagulation started   PVC's (premature ventricular contractions) 09/30/2021   Monitor 9/22: PVCs 33% // Metoprolol succ started // ILR interr 11/22: PVCs 22.5% // Echo 12/22: Frequent PVCs; EF 45-50, global HK, mild concentric LVH, normal RVSF, trivial MR, trivial AI, aortic root 44 mm   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 Dx ~ 2010.    Past Surgical History:  Procedure Laterality Date   CARDIAC CATHETERIZATION N/A 12/08/2015   Procedure: Left Heart Cath and Coronary Angiography;  Surgeon: Jettie Booze, MD;  Location: [facility];  Service: Cardiovascular;  Laterality: N/A;   CARDIAC CATHETERIZATION  12/08/2015   Procedure: Coronary Stent Intervention;  Surgeon: Jettie Booze, MD;  Location: [place] [facility];  Service: Cardiovascular;;   CERVICAL SPINE SURGERY     a. ~ 2002.   Right Knee Surgery     a. 08/2015.    Prior to Admission medications   Medication Sig Start Date End Date Taking? Authorizing Provider   acetaminophen (TYLENOL) 325 MG tablet Take 650 mg by mouth every 6 (six) hours as needed for mild pain, moderate pain or headache.   Yes [provider]  donepezil (ARICEPT) 10 MG tablet Take 1 tablet (10 mg total) by mouth at bedtime. Start 1/2 tablet daily x 4 weeks and then 1 tablet daily 07/09/21  Yes Garvin Fila, MD  finasteride (PROSCAR) 5 MG tablet Take 1 tablet by mouth daily. 01/21/21  Yes [provider]  glipiZIDE (GLUCOTROL XL) 5 MG 24 hr tablet Take 5 mg by mouth daily with breakfast.   Yes [provider]  metFORMIN (GLUCOPHAGE) 1000 MG tablet Take 1 tablet (1,000 mg total) by mouth 2 (two) times daily with a meal. 01/11/21  Yes Emokpae, Courage, MD  metoprolol succinate (TOPROL XL) 25 MG 24 hr tablet Take 1 tablet (25 mg total) by mouth at bedtime. 10/01/21 10/01/22 Yes Weaver, Scott T, PA-C  mexiletine (MEXITIL) 150 MG capsule Take 1 capsule (150 mg total) by mouth 2 (two) times daily. 11/08/21  Yes Camnitz, Will Hassell Done, MD  midodrine (PROAMATINE) 2.5 MG tablet Take 1 tablet (2.5 mg total) by mouth 3 (three) times daily with meals. 02/19/21  Yes Garvin Fila, MD  rivaroxaban (XARELTO) 20 MG TABS tablet Take 1 tablet (20 mg total) by mouth daily with supper. 11/08/21  Yes Camnitz, Ocie Doyne, MD  rosuvastatin (CRESTOR) 20 MG  tablet Take 1 tablet (20 mg total) by mouth daily. 05/13/21  Yes Jettie Booze, MD  tamsulosin [facility]) 0.4 MG CAPS capsule  10/29/20  Yes [provider]  donepezil (ARICEPT) 23 MG TABS tablet Take 1 tablet (23 mg total) by mouth at bedtime. Start only after first taking aricept 10 mg daily 11/11/21   Garvin Fila, MD    Allergies as of 09/26/2021 - Review Complete 09/25/2021  Allergen Reaction Noted   Sulfa antibiotics Hives 12/08/2015    Family History  Problem Relation Age of Onset   Cancer Father        died in his 45's.   Dementia Mother        died in her 21's.    Social History   Socioeconomic  History   Marital status: Married    Spouse name: Vaughan Basta   Number of children: Not on file   Years of education: Not on file   Highest education level: Not on file  Occupational History   Not on file  Tobacco Use   Smoking status: Former    Packs/day: 1.00    Years: 30.00    Pack years: 30.00    Types: Cigarettes   Smokeless tobacco: Never   Tobacco comments:    quit 1993  Vaping Use   Vaping Use: Never used  Substance and Sexual Activity   Alcohol use: Yes    Alcohol/week: 0.0 standard drinks    Comment: rare drink   Drug use: No   Sexual activity: Not on file  Other Topics Concern   Not on file  Social History Narrative   Lives with wife and son   Right Handed   Drinks 2-3 cups caffeine daily   Social Determinants of Health   Financial Resource Strain: Not on file  Food Insecurity: Not on file  Transportation Needs: Not on file  Physical Activity: Not on file  Stress: Not on file  Social Connections: Not on file  Intimate Partner Violence: Not on file    Review of Systems: See HPI, otherwise negative ROS  Physical Exam: BP 126/85    Pulse 71    Temp 97.6 F (36.4 C) (Temporal)    Ht 5\' 11"  (1.803 m)    Wt 92.1 kg    SpO2 98%    BMI 28.31 kg/m  General:   Alert,  pleasant and cooperative in NAD Head:  Normocephalic and atraumatic. Lungs:  Clear to auscultation.    Heart:  Regular rate and rhythm.   Impression/Plan: Shane Welch is here for ophthalmic surgery.  Risks, benefits, limitations, and alternatives regarding ophthalmic surgery have been reviewed with the patient.  Questions have been answered.  All parties agreeable.   Leandrew Koyanagi, MD  11/13/2021, 12:33 PM

## 2021-11-13 NOTE — Op Note (Signed)
OPERATIVE NOTE  Maninder Deboer [id number] 11/13/2021   PREOPERATIVE DIAGNOSIS:  Nuclear sclerotic cataract left eye. H25.12   POSTOPERATIVE DIAGNOSIS:    Nuclear sclerotic cataract left eye.     PROCEDURE:  Phacoemusification with posterior chamber intraocular lens placement of the left eye  Ultrasound time: Procedure(s): CATARACT EXTRACTION PHACO AND INTRAOCULAR LENS PLACEMENT (IOC) LEFT (Left)  LENS:   Implant Name Type Inv. Item Serial No. Manufacturer Lot No. LRB No. Used Action  LENS IOL TECNIS EYHANCE 20.0 - [id number] Intraocular Lens LENS IOL TECNIS EYHANCE 20.0 [id number] [employer]  Left 1 Implanted      SURGEON:  Deirdre Evener, MD   ANESTHESIA:  Topical with tetracaine drops and 2% Xylocaine jelly, augmented with 1% preservative-free intracameral lidocaine.    COMPLICATIONS:  None.   DESCRIPTION OF PROCEDURE:  The patient was identified in the holding room and transported to the operating room and placed in the supine position under the operating microscope.  The left eye was identified as the operative eye and it was prepped and draped in the usual sterile ophthalmic fashion.   A 1 millimeter clear-corneal paracentesis was made at the 1:30 position.  0.5 ml of preservative-free 1% lidocaine was injected into the anterior chamber.  The anterior chamber was filled with Viscoat viscoelastic.  A 2.4 millimeter keratome was used to make a near-clear corneal incision at the 10:30 position.  .  A curvilinear capsulorrhexis was made with a cystotome and capsulorrhexis forceps.  Balanced salt solution was used to hydrodissect and hydrodelineate the nucleus.   Phacoemulsification was then used in stop and chop fashion to remove the lens nucleus and epinucleus.  The remaining cortex was then removed using the irrigation and aspiration handpiece. Provisc was then placed into the capsular bag to distend it for lens placement.  A lens was then injected into the capsular bag.  The  remaining viscoelastic was aspirated.   Wounds were hydrated with balanced salt solution.  The anterior chamber was inflated to a physiologic pressure with balanced salt solution.  No wound leaks were noted. Cefuroxime 0.1 ml of a 10mg /ml solution was injected into the anterior chamber for a dose of 1 mg of intracameral antibiotic at the completion of the case.   Timolol and Brimonidine drops were applied to the eye.  The patient was taken to the recovery room in stable condition without complications of anesthesia or surgery.  Dareth Andrew 11/13/2021, 1:24 PM

## 2021-11-13 NOTE — Anesthesia Procedure Notes (Signed)
Procedure Name: MAC Date/Time: 11/13/2021 1:09 PM Performed by: Mayme Genta, CRNA Pre-anesthesia Checklist: Patient identified, Emergency Drugs available, Suction available, Timeout performed and Patient being monitored Patient Re-evaluated:Patient Re-evaluated prior to induction Oxygen Delivery Method: Nasal cannula Placement Confirmation: positive ETCO2

## 2021-11-13 NOTE — Anesthesia Postprocedure Evaluation (Signed)
Anesthesia Post Note  Patient: Shane Welch  Procedure(s) Performed: CATARACT EXTRACTION PHACO AND INTRAOCULAR LENS PLACEMENT (IOC) LEFT (Left: Eye)     Patient location during evaluation: PACU Anesthesia Type: MAC Level of consciousness: awake and alert Pain management: pain level controlled Vital Signs Assessment: post-procedure vital signs reviewed and stable Respiratory status: spontaneous breathing Cardiovascular status: stable Anesthetic complications: no   No notable events documented.  Gillian Scarce

## 2021-11-13 NOTE — Transfer of Care (Signed)
Immediate Anesthesia Transfer of Care Note  Patient: Shane Welch  Procedure(s) Performed: CATARACT EXTRACTION PHACO AND INTRAOCULAR LENS PLACEMENT (IOC) LEFT (Left: Eye)  Patient Location: PACU  Anesthesia Type: MAC  Level of Consciousness: awake, alert  and patient cooperative  Airway and Oxygen Therapy: Patient Spontanous Breathing and Patient connected to supplemental oxygen  Post-op Assessment: Post-op Vital signs reviewed, Patient's Cardiovascular Status Stable, Respiratory Function Stable, Patent Airway and No signs of Nausea or vomiting  Post-op Vital Signs: Reviewed and stable  Complications: No notable events documented.

## 2021-11-14 ENCOUNTER — Encounter: Payer: Self-pay | Admitting: Ophthalmology

## 2021-11-18 ENCOUNTER — Ambulatory Visit: Payer: Medicare PPO | Admitting: Interventional Cardiology

## 2021-11-22 ENCOUNTER — Telehealth: Payer: Self-pay | Admitting: Cardiology

## 2021-11-22 MED ORDER — MEXILETINE HCL 250 MG PO CAPS: 250.0000 mg | ORAL_CAPSULE | Freq: Two times a day (BID) | ORAL | 3 refills | Status: DC

## 2021-11-22 NOTE — Telephone Encounter (Signed)
Advised pt should be taking 250 mg BID. Rx corrected and sent into requested pharmacy. Wife advised to call if issues begin after this change. Wife  verbalized understanding and agreeable to plan.

## 2021-11-22 NOTE — Telephone Encounter (Signed)
° °  Pt c/o medication issue:  1. Name of Medication:   mexiletine (MEXITIL) 150 MG capsule    2. How are you currently taking this medication (dosage and times per day)? Take 1 capsule (150 mg total) by mouth 2 (two) times daily.  3. Are you having a reaction (difficulty breathing--STAT)?   4. What is your medication issue? Pt's wife said, on pt's AVS the pt needs to take 250 mg twice a day of this meds, but the prescription they got from pharmacy is 150 mg twice a day. She would like to clarify what dosage the pt needs to take.

## 2021-11-25 ENCOUNTER — Ambulatory Visit (INDEPENDENT_AMBULATORY_CARE_PROVIDER_SITE_OTHER): Payer: Medicare PPO

## 2021-11-25 DIAGNOSIS — I639 Cerebral infarction, unspecified: Secondary | ICD-10-CM

## 2021-11-25 LAB — CUP PACEART REMOTE DEVICE CHECK
Date Time Interrogation Session: 20230205231030
Implantable Pulse Generator Implant Date: 20220610

## 2021-11-27 ENCOUNTER — Ambulatory Visit: Payer: Medicare PPO | Admitting: Anesthesiology

## 2021-11-27 ENCOUNTER — Other Ambulatory Visit: Payer: Self-pay

## 2021-11-27 ENCOUNTER — Encounter
Admission: RE | Disposition: A | Discharge status: Home / Self Care | Payer: Self-pay | Source: Home / Self Care | Attending: Ophthalmology

## 2021-11-27 ENCOUNTER — Encounter: Payer: Self-pay | Admitting: Ophthalmology

## 2021-11-27 ENCOUNTER — Ambulatory Visit
Admission: RE | Admit: 2021-11-27 | Discharge: 2021-11-27 | Disposition: A | Discharge status: Home / Self Care | Payer: Medicare PPO | Attending: Ophthalmology | Admitting: Ophthalmology

## 2021-11-27 DIAGNOSIS — I4891 Unspecified atrial fibrillation: Secondary | ICD-10-CM | POA: Insufficient documentation

## 2021-11-27 DIAGNOSIS — Z87891 Personal history of nicotine dependence: Secondary | ICD-10-CM | POA: Diagnosis not present

## 2021-11-27 DIAGNOSIS — I10 Essential (primary) hypertension: Secondary | ICD-10-CM | POA: Insufficient documentation

## 2021-11-27 DIAGNOSIS — E1136 Type 2 diabetes mellitus with diabetic cataract: Secondary | ICD-10-CM | POA: Insufficient documentation

## 2021-11-27 DIAGNOSIS — Z8673 Personal history of transient ischemic attack (TIA), and cerebral infarction without residual deficits: Secondary | ICD-10-CM | POA: Insufficient documentation

## 2021-11-27 DIAGNOSIS — H2511 Age-related nuclear cataract, right eye: Secondary | ICD-10-CM | POA: Diagnosis present

## 2021-11-27 DIAGNOSIS — I252 Old myocardial infarction: Secondary | ICD-10-CM | POA: Diagnosis not present

## 2021-11-27 DIAGNOSIS — I2510 Atherosclerotic heart disease of native coronary artery without angina pectoris: Secondary | ICD-10-CM | POA: Insufficient documentation

## 2021-11-27 HISTORY — PX: CATARACT EXTRACTION W/PHACO: SHX586

## 2021-11-27 LAB — GLUCOSE, CAPILLARY
Glucose-Capillary: 143 mg/dL — ABNORMAL HIGH (ref 70–99)
Glucose-Capillary: 145 mg/dL — ABNORMAL HIGH (ref 70–99)

## 2021-11-27 SURGERY — PHACOEMULSIFICATION, CATARACT, WITH IOL INSERTION
Anesthesia: Monitor Anesthesia Care | Site: Eye | Laterality: Right

## 2021-11-27 MED ORDER — SIGHTPATH DOSE#1 BSS IO SOLN
INTRAOCULAR | Status: DC | PRN
  Administered 2021-11-27: 1 mL via INTRAMUSCULAR

## 2021-11-27 MED ORDER — ACETAMINOPHEN 325 MG PO TABS: 325.0000 mg | ORAL_TABLET | ORAL | Status: DC | PRN

## 2021-11-27 MED ORDER — BRIMONIDINE TARTRATE-TIMOLOL 0.2-0.5 % OP SOLN
OPHTHALMIC | Status: DC | PRN
  Administered 2021-11-27: 1 [drp] via OPHTHALMIC

## 2021-11-27 MED ORDER — ARMC OPHTHALMIC DILATING DROPS
1.0000 "application " | OPHTHALMIC | Status: DC | PRN
  Administered 2021-11-27 (×3): 1 via OPHTHALMIC

## 2021-11-27 MED ORDER — TETRACAINE HCL 0.5 % OP SOLN
1.0000 [drp] | OPHTHALMIC | Status: DC | PRN
  Administered 2021-11-27 (×3): 1 [drp] via OPHTHALMIC

## 2021-11-27 MED ORDER — FENTANYL CITRATE (PF) 100 MCG/2ML IJ SOLN
INTRAMUSCULAR | Status: DC | PRN
Start: 2021-11-27 — End: 2021-11-27
  Administered 2021-11-27: 50 ug via INTRAVENOUS

## 2021-11-27 MED ORDER — ACETAMINOPHEN 160 MG/5ML PO SOLN: 325.0000 mg | ORAL | Status: DC | PRN

## 2021-11-27 MED ORDER — LACTATED RINGERS IV SOLN: INTRAVENOUS | Status: DC

## 2021-11-27 MED ORDER — CEFUROXIME OPHTHALMIC INJECTION 1 MG/0.1 ML
INJECTION | OPHTHALMIC | Status: DC | PRN
  Administered 2021-11-27: 0.1 mL via INTRACAMERAL

## 2021-11-27 MED ORDER — SIGHTPATH DOSE#1 BSS IO SOLN
INTRAOCULAR | Status: DC | PRN
  Administered 2021-11-27: 15 mL

## 2021-11-27 MED ORDER — SIGHTPATH DOSE#1 BSS IO SOLN
INTRAOCULAR | Status: DC | PRN
  Administered 2021-11-27: 89 mL via OPHTHALMIC

## 2021-11-27 MED ORDER — SIGHTPATH DOSE#1 NA HYALUR & NA CHOND-NA HYALUR IO KIT
PACK | INTRAOCULAR | Status: DC | PRN
  Administered 2021-11-27: 1 via OPHTHALMIC

## 2021-11-27 MED ORDER — MIDAZOLAM HCL 2 MG/2ML IJ SOLN
INTRAMUSCULAR | Status: DC | PRN
  Administered 2021-11-27: 1 mg via INTRAVENOUS

## 2021-11-27 SURGICAL SUPPLY — 11 items
CATARACT SUITE SIGHTPATH (MISCELLANEOUS) ×2 IMPLANT
FEE CATARACT SUITE SIGHTPATH (MISCELLANEOUS) ×2 IMPLANT
GLOVE SRG 8 PF TXTR STRL LF DI (GLOVE) ×2 IMPLANT
GLOVE SURG ENC TEXT LTX SZ7.5 (GLOVE) ×3 IMPLANT
GLOVE SURG UNDER POLY LF SZ8 (GLOVE) ×2
LENS IOL TECNIS EYHANCE 21.0 (Intraocular Lens) ×1 IMPLANT
NDL FILTER BLUNT 18X1 1/2 (NEEDLE) ×2 IMPLANT
NEEDLE FILTER BLUNT 18X 1/2SAF (NEEDLE) ×1
NEEDLE FILTER BLUNT 18X1 1/2 (NEEDLE) ×1 IMPLANT
SYR 3ML LL SCALE MARK (SYRINGE) ×3 IMPLANT
WATER STERILE IRR 250ML POUR (IV SOLUTION) ×3 IMPLANT

## 2021-11-27 NOTE — Anesthesia Preprocedure Evaluation (Signed)
Anesthesia Evaluation  Patient identified by MRN, date of birth, ID band Patient awake    Reviewed: Allergy & Precautions, H&P , NPO status , Patient's Chart, lab work & pertinent test results, reviewed documented beta blocker date and time   Airway Mallampati: II  TM Distance: >3 FB Neck ROM: full    Dental no notable dental hx.    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 ischemic cardiomyopathy with EF 37% - improved to 50%   Neuro/Psych CVA affecting memory and L weakness CVA, Residual Symptoms negative psych ROS   GI/Hepatic negative GI ROS, Neg liver ROS,   Endo/Other  negative endocrine ROSdiabetes  Renal/GU CRFRenal disease  negative genitourinary   Musculoskeletal   Abdominal   Peds  Hematology negative hematology ROS (+)   Anesthesia Other Findings   Reproductive/Obstetrics negative OB ROS                             Anesthesia Physical Anesthesia Plan  ASA: 3  Anesthesia Plan: MAC   Post-op Pain Management:    Induction:   PONV Risk Score and Plan:   Airway Management Planned:   Additional Equipment:   Intra-op Plan:   Post-operative Plan:   Informed Consent: I have reviewed the patients History and Physical, chart, labs and discussed the procedure including the risks, benefits and alternatives for the proposed anesthesia with the patient or authorized representative who has indicated his/her understanding and acceptance.     Dental Advisory Given  Plan Discussed with: CRNA and Anesthesiologist  Anesthesia Plan Comments:         Anesthesia Quick Evaluation

## 2021-11-27 NOTE — Anesthesia Postprocedure Evaluation (Signed)
Anesthesia Post Note  Patient: Kailin Leu  Procedure(s) Performed: CATARACT EXTRACTION PHACO AND INTRAOCULAR LENS PLACEMENT (IOC) RIGHT 8.84 01:28.4 (Right: Eye)     Patient location during evaluation: PACU Anesthesia Type: MAC Level of consciousness: awake and alert Pain management: pain level controlled Vital Signs Assessment: post-procedure vital signs reviewed and stable Respiratory status: spontaneous breathing, nonlabored ventilation, respiratory function stable and patient connected to nasal cannula oxygen Cardiovascular status: stable and blood pressure returned to baseline Postop Assessment: no apparent nausea or vomiting Anesthetic complications: no   No notable events documented.  Trecia Rogers

## 2021-11-27 NOTE — H&P (Signed)
[facility]   Primary Care Physician:  Leonard Downing, MD Ophthalmologist: Dr. Leandrew Koyanagi  Pre-Procedure History & Physical: HPI:  Shane Welch is a 81 y.o. male here for ophthalmic surgery.   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   Echo 6/17: 42 mm // Echo 10/2018: 43 mm // Echocardiogram 3/22: 42 mm // Echo 12/22: 44 mm   Essential hypertension 09/03/2017   Hyperlipidemia    Ischemic cardiomyopathy    s/p Ant MI 2/17 - Echo EF 40-45 // Echo 6/17: EF 55 // Echo 10/2018: EF 50 // Echo 3/22: EF 50-55   Paroxysmal atrial fibrillation ([place]) 09/30/2021   S/p CVA in 3/22 >> ILR // AFib IDd on ILR>>anticoagulation started   PVC's (premature ventricular contractions) 09/30/2021   Monitor 9/22: PVCs 33% // Metoprolol succ started // ILR interr 11/22: PVCs 22.5% // Echo 12/22: Frequent PVCs; EF 45-50, global HK, mild concentric LVH, normal RVSF, trivial MR, trivial AI, aortic root 44 mm   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 Dx ~ 2010.    Past Surgical History:  Procedure Laterality Date   CARDIAC CATHETERIZATION N/A 12/08/2015   Procedure: Left Heart Cath and Coronary Angiography;  Surgeon: Jettie Booze, MD;  Location: [place] [facility];  Service: Cardiovascular;  Laterality: N/A;   CARDIAC CATHETERIZATION  12/08/2015   Procedure: Coronary Stent Intervention;  Surgeon: Jettie Booze, MD;  Location: [place] [facility];  Service: Cardiovascular;;   CATARACT EXTRACTION W/PHACO Left 11/13/2021   Procedure: CATARACT EXTRACTION PHACO AND INTRAOCULAR LENS PLACEMENT ([place]) LEFT;  Surgeon: Leandrew Koyanagi, MD;  Location: [place];   Service: Ophthalmology;  Laterality: Left;  9.50 01:22.5   CERVICAL SPINE SURGERY     a. ~ 2002.   Right Knee Surgery     a. 08/2015.    Prior to Admission medications   Medication Sig Start Date End Date Taking? Authorizing Provider  acetaminophen (TYLENOL) 325 MG tablet Take 650 mg by mouth every 6 (six) hours as needed for mild pain, moderate pain or headache.   Yes [provider]  donepezil (ARICEPT) 10 MG tablet Take 1 tablet (10 mg total) by mouth at bedtime. Start 1/2 tablet daily x 4 weeks and then 1 tablet daily 07/09/21  Yes Garvin Fila, MD  donepezil (ARICEPT) 23 MG TABS tablet Take 1 tablet (23 mg total) by mouth at bedtime. Start only after first taking aricept 10 mg daily 11/11/21  Yes Garvin Fila, MD  finasteride (PROSCAR) 5 MG tablet Take 1 tablet by mouth daily. 01/21/21  Yes [provider]  glipiZIDE (GLUCOTROL XL) 5 MG 24 hr tablet Take 5 mg by mouth daily with breakfast.   Yes [provider]  metFORMIN (GLUCOPHAGE) 1000 MG tablet Take 1 tablet (1,000 mg total) by mouth 2 (two) times daily with a meal. 01/11/21  Yes Emokpae, Courage, MD  metoprolol succinate (TOPROL XL) 25 MG 24 hr tablet Take 1 tablet (25 mg total) by mouth at bedtime. 10/01/21 10/01/22 Yes Weaver, Scott T, PA-C  mexiletine (MEXITIL) 250 MG capsule Take 1 capsule (250 mg  total) by mouth 2 (two) times daily. 11/22/21  Yes Camnitz, Will Hassell Done, MD  midodrine (PROAMATINE) 2.5 MG tablet Take 1 tablet (2.5 mg total) by mouth 3 (three) times daily with meals. 02/19/21  Yes Garvin Fila, MD  rivaroxaban (XARELTO) 20 MG TABS tablet Take 1 tablet (20 mg total) by mouth daily with supper. 11/08/21  Yes Camnitz, Will Hassell Done, MD  rosuvastatin (CRESTOR) 20 MG tablet Take 1 tablet (20 mg total) by mouth daily. 05/13/21  Yes Jettie Booze, MD  tamsulosin [facility]) 0.4 MG CAPS capsule  10/29/20  Yes [provider]    Allergies as of 09/26/2021 - Review Complete 09/25/2021   Allergen Reaction Noted   Sulfa antibiotics Hives 12/08/2015    Family History  Problem Relation Age of Onset   Cancer Father        died in his 56's.   Dementia Mother        died in her 72's.    Social History   Socioeconomic History   Marital status: Married    Spouse name: Vaughan Basta   Number of children: Not on file   Years of education: Not on file   Highest education level: Not on file  Occupational History   Not on file  Tobacco Use   Smoking status: Former    Packs/day: 1.00    Years: 30.00    Pack years: 30.00    Types: Cigarettes   Smokeless tobacco: Never   Tobacco comments:    quit 1993  Vaping Use   Vaping Use: Never used  Substance and Sexual Activity   Alcohol use: Yes    Alcohol/week: 0.0 standard drinks    Comment: rare drink   Drug use: No   Sexual activity: Not on file  Other Topics Concern   Not on file  Social History Narrative   Lives with wife and son   Right Handed   Drinks 2-3 cups caffeine daily   Social Determinants of Health   Financial Resource Strain: Not on file  Food Insecurity: Not on file  Transportation Needs: Not on file  Physical Activity: Not on file  Stress: Not on file  Social Connections: Not on file  Intimate Partner Violence: Not on file    Review of Systems: See HPI, otherwise negative ROS  Physical Exam: BP (!) 148/103    Pulse 66    Temp (!) 97.3 F (36.3 C) (Temporal)    Wt 91.6 kg    SpO2 96%    BMI 28.17 kg/m  General:   Alert,  pleasant and cooperative in NAD Head:  Normocephalic and atraumatic. Lungs:  Clear to auscultation.    Heart:  irregular rate and rhythm.  Impression/Plan: Shane Welch is here for ophthalmic surgery.  Risks, benefits, limitations, and alternatives regarding ophthalmic surgery have been reviewed with the patient.  Questions have been answered.  All parties agreeable.   Leandrew Koyanagi, MD  11/27/2021, 8:08 AM

## 2021-11-27 NOTE — Op Note (Signed)
LOCATION:  [facility]   PREOPERATIVE DIAGNOSIS:    Nuclear sclerotic cataract right eye. H25.11   POSTOPERATIVE DIAGNOSIS:  Nuclear sclerotic cataract right eye.     PROCEDURE:  Phacoemusification with posterior chamber intraocular lens placement of the right eye   ULTRASOUND TIME: Procedure(s): CATARACT EXTRACTION PHACO AND INTRAOCULAR LENS PLACEMENT (IOC) RIGHT 8.84 01:28.4 (Right)  LENS:   Implant Name Type Inv. Item Serial No. Manufacturer Lot No. LRB No. Used Action  LENS IOL TECNIS EYHANCE 21.0 - [id number] Intraocular Lens LENS IOL TECNIS EYHANCE 21.0 [id number] [employer]  Right 1 Implanted         SURGEON:  Deirdre Evener, MD   ANESTHESIA:  Topical with tetracaine drops and 2% Xylocaine jelly, augmented with 1% preservative-free intracameral lidocaine.    COMPLICATIONS:  None.   DESCRIPTION OF PROCEDURE:  The patient was identified in the holding room and transported to the operating room and placed in the supine position under the operating microscope.  The right eye was identified as the operative eye and it was prepped and draped in the usual sterile ophthalmic fashion.   A 1 millimeter clear-corneal paracentesis was made at the 12:00 position.  0.5 ml of preservative-free 1% lidocaine was injected into the anterior chamber. The anterior chamber was filled with Viscoat viscoelastic.  A 2.4 millimeter keratome was used to make a near-clear corneal incision at the 9:00 position.  A curvilinear capsulorrhexis was made with a cystotome and capsulorrhexis forceps.  Balanced salt solution was used to hydrodissect and hydrodelineate the nucleus.   Phacoemulsification was then used in stop and chop fashion to remove the lens nucleus and epinucleus.  The remaining cortex was then removed using the irrigation and aspiration handpiece. Provisc was then placed into the capsular bag to distend it for lens placement.  A lens was then injected into the capsular bag.  The  remaining viscoelastic was aspirated.   Wounds were hydrated with balanced salt solution.  The anterior chamber was inflated to a physiologic pressure with balanced salt solution.  No wound leaks were noted. Cefuroxime 0.1 ml of a 10mg /ml solution was injected into the anterior chamber for a dose of 1 mg of intracameral antibiotic at the completion of the case.   Timolol and Brimonidine drops were applied to the eye.  The patient was taken to the recovery room in stable condition without complications of anesthesia or surgery.   Holden Draughon 11/27/2021, 8:58 AM

## 2021-11-27 NOTE — Progress Notes (Signed)
Carelink Summary Report / Loop Recorder 

## 2021-11-27 NOTE — Transfer of Care (Signed)
Immediate Anesthesia Transfer of Care Note  Patient: Shane Welch  Procedure(s) Performed: CATARACT EXTRACTION PHACO AND INTRAOCULAR LENS PLACEMENT (IOC) RIGHT 8.84 01:28.4 (Right: Eye)  Patient Location: PACU  Anesthesia Type: MAC  Level of Consciousness: awake, alert  and patient cooperative  Airway and Oxygen Therapy: Patient Spontanous Breathing and Patient connected to supplemental oxygen  Post-op Assessment: Post-op Vital signs reviewed, Patient's Cardiovascular Status Stable, Respiratory Function Stable, Patent Airway and No signs of Nausea or vomiting  Post-op Vital Signs: Reviewed and stable  Complications: No notable events documented.

## 2021-11-28 ENCOUNTER — Encounter: Payer: Self-pay | Admitting: Ophthalmology

## 2021-12-03 ENCOUNTER — Ambulatory Visit: Payer: Medicare PPO | Admitting: Internal Medicine

## 2021-12-03 ENCOUNTER — Other Ambulatory Visit: Payer: Self-pay

## 2021-12-03 ENCOUNTER — Telehealth: Payer: Self-pay

## 2021-12-03 ENCOUNTER — Encounter: Payer: Self-pay | Admitting: Internal Medicine

## 2021-12-03 VITALS — BP 126/86 | HR 64 | Temp 98.0°F | Ht 71.0 in | Wt 204.0 lb

## 2021-12-03 DIAGNOSIS — G4719 Other hypersomnia: Secondary | ICD-10-CM | POA: Diagnosis not present

## 2021-12-03 DIAGNOSIS — R5382 Chronic fatigue, unspecified: Secondary | ICD-10-CM

## 2021-12-03 DIAGNOSIS — R5383 Other fatigue: Secondary | ICD-10-CM | POA: Insufficient documentation

## 2021-12-03 DIAGNOSIS — J8410 Pulmonary fibrosis, unspecified: Secondary | ICD-10-CM

## 2021-12-03 NOTE — Addendum Note (Signed)
Addended by: Carleene Mains D on: 12/03/2021 04:42 PM   Modules accepted: Orders

## 2021-12-03 NOTE — Patient Instructions (Addendum)
We will walk you today   We will set you up for a sleep study with follow up with one of our sleep docs here.   We will set you up for a High Resolution CT chest and I will call the results when available   Pulmonary follow up is as needed

## 2021-12-03 NOTE — Assessment & Plan Note (Signed)
Incidental finding  on Coronary CT chest 11/05/21  -   12/03/2021   Walked on RA  x  3  lap(s) =  approx 450  ft  @ mod fast pace, stopped due to end of study  with lowest 02 sats 97% and no sob  -   HRCT 12/03/2021 >>>   No risk factors for NSIP identified so could be very early phase of IPF / UIP > HRCT next and if abnormal then pfts 6 m apart to gauge progression with serial walking sats in meantime

## 2021-12-03 NOTE — Progress Notes (Signed)
Shane Welch, male    DOB: 06-11-1941,   MRN: [id number]   Brief patient profile:  60  yowm  quit smoking 1993 s/p cva March 2022 affected speech/ memory referred to pulmonary clinic in [place]  12/03/2021 by Shane Welch for abn ct      History of Present Illness  12/03/2021  Pulmonary/ 1st Welch eval/ Shane Welch / Shane Welch  Chief Complaint  Patient presents with   Consult    Abnormal chest ct at [facility] 11/05/2021  Dyspnea:  [employer] a week prior to OV  x one aisle 100 ft then legs give out due to fatiuge  Cough: none  Sleep: 4-10 h all positions bed is flat/ one pillow weak and very  tired in am but not classic hypersomnolence SABA use: none  0 2 none   No obvious day to day or daytime variability or assoc excess/ purulent sputum or mucus plugs or hemoptysis or cp or chest tightness, subjective wheeze or overt sinus or hb symptoms.   Sleeeping as above  without obvious nocturnal  or early am exacerbation  of respiratory  c/o's or need for noct saba. Also denies any obvious fluctuation of symptoms with weather or environmental changes or other aggravating or alleviating factors except as outlined above   No unusual exposure hx or h/o childhood pna/ asthma or knowledge of premature birth.  Current Allergies, Complete Past Medical History, Past Surgical History, Family History, and Social History were reviewed in Shane Welch.  ROS  The following are not active complaints unless bolded Hoarseness, sore throat, dysphagia, dental problems, itching, sneezing,  nasal congestion or discharge of excess mucus or purulent secretions, ear ache,   fever, chills, sweats, unintended wt loss or wt gain, classically pleuritic or exertional cp,  orthopnea pnd or arm/hand swelling  or leg swelling, presyncope, palpitations, abdominal pain, anorexia, nausea, vomiting, diarrhea  or change in bowel habits or change in bladder habits, change in stools or change in urine, dysuria,  hematuria,  rash, arthralgias, visual complaints, headache, numbness, weakness or ataxia or problems with walking or coordination,  change in mood or  memory.           Past Medical History:  Diagnosis Date   CAD (coronary artery disease) 12/10/2015   Ant MI 2/17>>DES to LAD // Myoview 12/22: EF 37, no ischemia (int risk)   CKD (chronic kidney disease), stage III (Shane Welch) 10/01/2021   Diabetes ([place]) 12/10/2015   Dilated aortic root (Shane Welch) 09/06/2018   Echo 6/17: 42 mm // Echo 10/2018: 43 mm // Echocardiogram 3/22: 42 mm // Echo 12/22: 44 mm   Essential hypertension 09/03/2017   Hyperlipidemia    Ischemic cardiomyopathy    s/p Ant MI 2/17 - Echo EF 40-45 // Echo 6/17: EF 55 // Echo 10/2018: EF 50 // Echo 3/22: EF 50-55   Paroxysmal atrial fibrillation ([place]) 09/30/2021   S/p CVA in 3/22 >> ILR // AFib IDd on ILR>>anticoagulation started   PVC's (premature ventricular contractions) 09/30/2021   Monitor 9/22: PVCs 33% // Metoprolol succ started // ILR interr 11/22: PVCs 22.5% // Echo 12/22: Frequent PVCs; EF 45-50, global HK, mild concentric LVH, normal RVSF, trivial MR, trivial AI, aortic root 44 mm   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 Dx ~ 2010.    Outpatient Medications Prior to Visit  Medication Sig Dispense Refill  acetaminophen (TYLENOL) 325 MG tablet Take 650 mg by mouth every 6 (six) hours as needed for mild pain, moderate pain or headache.     donepezil (ARICEPT) 23 MG TABS tablet Take 1 tablet (23 mg total) by mouth at bedtime. Start only after first taking aricept 10 mg daily 30 tablet 3   finasteride (PROSCAR) 5 MG tablet Take 1 tablet by mouth daily.     glipiZIDE (GLUCOTROL XL) 5 MG 24 hr tablet Take 5 mg by mouth daily with breakfast.     metFORMIN (GLUCOPHAGE) 1000 MG tablet Take 1 tablet (1,000 mg total) by mouth 2 (two) times daily with a meal. 60 tablet 3   metoprolol succinate (TOPROL XL) 25 MG  24 hr tablet Take 1 tablet (25 mg total) by mouth at bedtime. 90 tablet 3   mexiletine (MEXITIL) 250 MG capsule Take 1 capsule (250 mg total) by mouth 2 (two) times daily. 60 capsule 3   midodrine (PROAMATINE) 2.5 MG tablet Take 1 tablet (2.5 mg total) by mouth 3 (three) times daily with meals. 30 tablet 0   rivaroxaban (XARELTO) 20 MG TABS tablet Take 1 tablet (20 mg total) by mouth daily with supper. 30 tablet 6   rosuvastatin (CRESTOR) 20 MG tablet Take 1 tablet (20 mg total) by mouth daily. 90 tablet 3   tamsulosin (FLOMAX) 0.4 MG CAPS capsule      donepezil (ARICEPT) 10 MG tablet Take 1 tablet (10 mg total) by mouth at bedtime. Start 1/2 tablet daily x 4 weeks and then 1 tablet daily 30 tablet 3   No facility-administered medications prior to visit.     Objective:     BP 126/86 (BP Location: Left Arm, Patient Position: Sitting)    Pulse 64    Temp 98 F (36.7 C) (Temporal)    Ht 5\' 11"  (1.803 m)    Wt 204 lb 0.6 oz (92.6 kg)    SpO2 97% Comment: ra   BMI 28.46 kg/m   SpO2: 97 % (ra)   HEENT : pt wearing mask not removed for exam due to covid -19 concerns.    NECK :  without JVD/Nodes/TM/ nl carotid upstrokes bilaterally   LUNGS: no acc muscle use,  Nl contour chest which is clear to A and P bilaterally without cough on insp or exp maneuvers   CV:  RRR  no s3 or murmur or increase in P2, and no edema   ABD:  soft and nontender with nl inspiratory excursion in the supine position. No bruits or organomegaly appreciated, bowel sounds nl  MS:  Nl gait/ ext warm without deformities, calf tenderness, cyanosis or clubbing No obvious joint restrictions   SKIN: warm and dry without lesions    NEURO:  alert, approp, nl sensorium with  no motor or cerebellar deficits apparent.      Assessment   Postinflammatory pulmonary fibrosis (Shane Welch) Incidental finding  on Coronary CT chest 11/05/21  -   12/03/2021   Walked on RA  x  3  lap(s) =  approx 450  ft  @ mod fast pace, stopped due to  end of study  with lowest 02 sats 97% and no sob  -   HRCT 12/03/2021 >>>   No risk factors for NSIP identified so could be very early phase of IPF / UIP > HRCT next and if abnormal then pfts 6 m apart to gauge progression with serial walking sats in meantime      Fatigue Onset p CVA  March 2022 involving R Occ lobe by MRI  - Split night sleep study @ rec of Dr Elsworth Soho with f/u with Dr Elsworth Soho if positive.  If negative unlikely his symptoms have anything to do with osa or a pulmonary origin and would point more to a functional illness following what was apparently a relatively mild cva   Discussed in detail all the  indications, usual  risks and alternatives  relative to the benefits with patient who agrees to proceed with w/u as outlined.     Each maintenance medication was reviewed in detail including emphasizing most importantly the difference between maintenance and prns and under what circumstances the prns are to be triggered using an action plan format where appropriate.  Total time for H and P, chart review, counseling,  directly observing portions of ambulatory 02 saturation study/ and generating customized AVS unique to this Welch visit / same day charting =  45 min         Christinia Gully, MD 12/03/2021

## 2021-12-03 NOTE — Assessment & Plan Note (Addendum)
Onset p CVA March 2022 involving R Occ lobe by MRI  - Split night sleep study @ rec of Dr Vassie Loll with f/u with Dr Vassie Loll if positive.  If negative unlikely his symptoms have anything to do with osa or a pulmonary origin and would point more to a functional illness following what was apparently a relatively mild cva   Discussed in detail all the  indications, usual  risks and alternatives  relative to the benefits with patient who agrees to proceed with w/u as outlined.     Each maintenance medication was reviewed in detail including emphasizing most importantly the difference between maintenance and prns and under what circumstances the prns are to be triggered using an action plan format where appropriate.  Total time for H and P, chart review, counseling,  directly observing portions of ambulatory 02 saturation study/ and generating customized AVS unique to this office visit / same day charting =  45 min

## 2021-12-03 NOTE — Telephone Encounter (Signed)
Patient saw Dr. Sherene Sires today in office and rec a sleep eval for excessive daytime sleepiness. Wanted to go ahead and have pt's sleep study done. Patient is scheduled for a sleep consult in April 2023 with Dr. Craige Cotta. Per Dr. Sherene Sires, Dr. Vassie Loll recommends a split night study for this patient.   Dr.Wert suggested placing order for split night study under Dr. Silverio Lay name since consult will be with him. Will route to Dr. Craige Cotta to make sure this is okay.   Please advise. Thanks!

## 2021-12-04 ENCOUNTER — Other Ambulatory Visit: Payer: Self-pay

## 2021-12-04 DIAGNOSIS — G4719 Other hypersomnia: Secondary | ICD-10-CM

## 2021-12-04 NOTE — Telephone Encounter (Signed)
Order placed under Dr. Rolin Barry name for in lab study.  Called and informed patients wife [name]- ok per dpr) that we are going to try to do an in lab study but if insurance denies we will do a home sleep study.  Patients wife voiced understanding and is going to relay message to patient and have him call back for any questions. Nothing further needed.

## 2021-12-04 NOTE — Telephone Encounter (Signed)
Fine to put my name, if insurance balks just do home study as the cva was not so severe that we absolutely need split night anyway, at least as first step

## 2021-12-04 NOTE — Telephone Encounter (Signed)
Will route to [facility] to check on this

## 2021-12-04 NOTE — Telephone Encounter (Signed)
Can you check with [facility] if that will be an issue with his insurance if I order the test, but I wasn't the provider that did the face to face visit before the test was done.

## 2021-12-30 ENCOUNTER — Ambulatory Visit (INDEPENDENT_AMBULATORY_CARE_PROVIDER_SITE_OTHER): Payer: Medicare PPO

## 2021-12-30 DIAGNOSIS — I639 Cerebral infarction, unspecified: Secondary | ICD-10-CM | POA: Diagnosis not present

## 2021-12-30 LAB — CUP PACEART REMOTE DEVICE CHECK
Date Time Interrogation Session: 20230312230537
Implantable Pulse Generator Implant Date: 20220610

## 2022-01-09 NOTE — Progress Notes (Signed)
Carelink Summary Report / Loop Recorder 

## 2022-01-13 ENCOUNTER — Other Ambulatory Visit: Payer: Self-pay

## 2022-01-13 ENCOUNTER — Ambulatory Visit (HOSPITAL_COMMUNITY)
Admission: RE | Admit: 2022-01-13 | Discharge: 2022-01-13 | Disposition: A | Discharge status: Home / Self Care | Payer: Medicare PPO | Source: Ambulatory Visit | Attending: Internal Medicine | Admitting: Internal Medicine

## 2022-01-13 DIAGNOSIS — J8410 Pulmonary fibrosis, unspecified: Secondary | ICD-10-CM | POA: Insufficient documentation

## 2022-01-15 ENCOUNTER — Telehealth: Payer: Self-pay | Admitting: Internal Medicine

## 2022-01-15 NOTE — Telephone Encounter (Signed)
Called and spoke to patients wife (ok per DPR) she voiced understanding of results and states the patient is not having anymore SOB or DOE. She states I he begins to have SOB again they will call for an appt. She asked that I cancel the sleep consult appt with Dr. Craige Cotta. Appt cancelled. Nothing further needed ?

## 2022-01-15 NOTE — Telephone Encounter (Signed)
No documentation of a prev. Phone call noted.  ?ATC patient and patients wife regarding Ct results and no answer LMTCB  ?

## 2022-01-20 ENCOUNTER — Encounter: Payer: Medicare PPO | Admitting: Pulmonary Disease

## 2022-01-23 ENCOUNTER — Encounter: Payer: Medicare PPO | Admitting: Cardiology

## 2022-02-03 ENCOUNTER — Ambulatory Visit (INDEPENDENT_AMBULATORY_CARE_PROVIDER_SITE_OTHER): Payer: Medicare PPO

## 2022-02-03 DIAGNOSIS — I639 Cerebral infarction, unspecified: Secondary | ICD-10-CM

## 2022-02-03 LAB — CUP PACEART REMOTE DEVICE CHECK
Date Time Interrogation Session: 20230414230909
Implantable Pulse Generator Implant Date: 20220610

## 2022-02-07 ENCOUNTER — Encounter (HOSPITAL_COMMUNITY): Payer: Self-pay

## 2022-02-07 ENCOUNTER — Emergency Department (HOSPITAL_COMMUNITY): Payer: Medicare PPO

## 2022-02-07 ENCOUNTER — Observation Stay (HOSPITAL_COMMUNITY)
Admission: EM | Admit: 2022-02-07 | Discharge: 2022-02-09 | Disposition: A | Discharge status: Home Health | Payer: Medicare PPO | Attending: Internal Medicine | Admitting: Internal Medicine

## 2022-02-07 ENCOUNTER — Other Ambulatory Visit: Payer: Self-pay

## 2022-02-07 DIAGNOSIS — Z87891 Personal history of nicotine dependence: Secondary | ICD-10-CM | POA: Insufficient documentation

## 2022-02-07 DIAGNOSIS — E1122 Type 2 diabetes mellitus with diabetic chronic kidney disease: Secondary | ICD-10-CM | POA: Insufficient documentation

## 2022-02-07 DIAGNOSIS — Z20822 Contact with and (suspected) exposure to covid-19: Secondary | ICD-10-CM | POA: Diagnosis not present

## 2022-02-07 DIAGNOSIS — E119 Type 2 diabetes mellitus without complications: Secondary | ICD-10-CM | POA: Diagnosis not present

## 2022-02-07 DIAGNOSIS — N1832 Chronic kidney disease, stage 3b: Secondary | ICD-10-CM | POA: Diagnosis not present

## 2022-02-07 DIAGNOSIS — I493 Ventricular premature depolarization: Secondary | ICD-10-CM | POA: Diagnosis present

## 2022-02-07 DIAGNOSIS — I129 Hypertensive chronic kidney disease with stage 1 through stage 4 chronic kidney disease, or unspecified chronic kidney disease: Secondary | ICD-10-CM | POA: Insufficient documentation

## 2022-02-07 DIAGNOSIS — R4701 Aphasia: Secondary | ICD-10-CM | POA: Diagnosis present

## 2022-02-07 DIAGNOSIS — E785 Hyperlipidemia, unspecified: Secondary | ICD-10-CM | POA: Insufficient documentation

## 2022-02-07 DIAGNOSIS — Z79899 Other long term (current) drug therapy: Secondary | ICD-10-CM | POA: Insufficient documentation

## 2022-02-07 DIAGNOSIS — I639 Cerebral infarction, unspecified: Secondary | ICD-10-CM | POA: Diagnosis present

## 2022-02-07 DIAGNOSIS — I480 Paroxysmal atrial fibrillation: Secondary | ICD-10-CM | POA: Diagnosis present

## 2022-02-07 DIAGNOSIS — Z7984 Long term (current) use of oral hypoglycemic drugs: Secondary | ICD-10-CM | POA: Insufficient documentation

## 2022-02-07 DIAGNOSIS — G459 Transient cerebral ischemic attack, unspecified: Principal | ICD-10-CM | POA: Insufficient documentation

## 2022-02-07 DIAGNOSIS — R2681 Unsteadiness on feet: Secondary | ICD-10-CM | POA: Insufficient documentation

## 2022-02-07 DIAGNOSIS — I2510 Atherosclerotic heart disease of native coronary artery without angina pectoris: Secondary | ICD-10-CM | POA: Diagnosis present

## 2022-02-07 DIAGNOSIS — I10 Essential (primary) hypertension: Secondary | ICD-10-CM | POA: Diagnosis present

## 2022-02-07 LAB — CBC
HCT: 46.5 % (ref 39.0–52.0)
Hemoglobin: 15.2 g/dL (ref 13.0–17.0)
MCH: 29.6 pg (ref 26.0–34.0)
MCHC: 32.7 g/dL (ref 30.0–36.0)
MCV: 90.6 fL (ref 80.0–100.0)
Platelets: 221 10*3/uL (ref 150–400)
RBC: 5.13 MIL/uL (ref 4.22–5.81)
RDW: 13.9 % (ref 11.5–15.5)
WBC: 10.3 10*3/uL (ref 4.0–10.5)
nRBC: 0 % (ref 0.0–0.2)

## 2022-02-07 LAB — I-STAT CHEM 8, ED
BUN: 26 mg/dL — ABNORMAL HIGH (ref 8–23)
Calcium, Ion: 1.01 mmol/L — ABNORMAL LOW (ref 1.15–1.40)
Chloride: 102 mmol/L (ref 98–111)
Creatinine, Ser: 1.9 mg/dL — ABNORMAL HIGH (ref 0.61–1.24)
Glucose, Bld: 148 mg/dL — ABNORMAL HIGH (ref 70–99)
HCT: 47 % (ref 39.0–52.0)
Hemoglobin: 16 g/dL (ref 13.0–17.0)
Potassium: 4.3 mmol/L (ref 3.5–5.1)
Sodium: 141 mmol/L (ref 135–145)
TCO2: 29 mmol/L (ref 22–32)

## 2022-02-07 LAB — COMPREHENSIVE METABOLIC PANEL
ALT: 16 U/L (ref 0–44)
AST: 24 U/L (ref 15–41)
Albumin: 3.6 g/dL (ref 3.5–5.0)
Alkaline Phosphatase: 46 U/L (ref 38–126)
Anion gap: 10 (ref 5–15)
BUN: 20 mg/dL (ref 8–23)
CO2: 26 mmol/L (ref 22–32)
Calcium: 8.9 mg/dL (ref 8.9–10.3)
Chloride: 104 mmol/L (ref 98–111)
Creatinine, Ser: 1.84 mg/dL — ABNORMAL HIGH (ref 0.61–1.24)
GFR, Estimated: 37 mL/min — ABNORMAL LOW (ref >60)
Glucose, Bld: 151 mg/dL — ABNORMAL HIGH (ref 70–99)
Potassium: 4.4 mmol/L (ref 3.5–5.1)
Sodium: 140 mmol/L (ref 135–145)
Total Bilirubin: 0.7 mg/dL (ref 0.3–1.2)
Total Protein: 6.6 g/dL (ref 6.5–8.1)

## 2022-02-07 LAB — ETHANOL: Alcohol, Ethyl (B): <10 mg/dL (ref <10)

## 2022-02-07 LAB — RESP PANEL BY RT-PCR (FLU A&B, COVID) ARPGX2
Influenza A by PCR: NEGATIVE
Influenza B by PCR: NEGATIVE
SARS Coronavirus 2 by RT PCR: NEGATIVE

## 2022-02-07 LAB — GLUCOSE, CAPILLARY: Glucose-Capillary: 122 mg/dL — ABNORMAL HIGH (ref 70–99)

## 2022-02-07 LAB — CBG MONITORING, ED: Glucose-Capillary: 191 mg/dL — ABNORMAL HIGH (ref 70–99)

## 2022-02-07 LAB — DIFFERENTIAL
Abs Immature Granulocytes: 0.04 10*3/uL (ref 0.00–0.07)
Basophils Absolute: 0.1 10*3/uL (ref 0.0–0.1)
Basophils Relative: 1 %
Eosinophils Absolute: 0.2 10*3/uL (ref 0.0–0.5)
Eosinophils Relative: 2 %
Immature Granulocytes: 0 %
Lymphocytes Relative: 33 %
Lymphs Abs: 3.4 10*3/uL (ref 0.7–4.0)
Monocytes Absolute: 0.9 10*3/uL (ref 0.1–1.0)
Monocytes Relative: 8 %
Neutro Abs: 5.8 10*3/uL (ref 1.7–7.7)
Neutrophils Relative %: 56 %

## 2022-02-07 LAB — PROTIME-INR
INR: 1 (ref 0.8–1.2)
Prothrombin Time: 12.7 seconds (ref 11.4–15.2)

## 2022-02-07 LAB — APTT: aPTT: 27 seconds (ref 24–36)

## 2022-02-07 MED ORDER — ACETAMINOPHEN 325 MG PO TABS: 650.0000 mg | ORAL_TABLET | ORAL | Status: DC | PRN

## 2022-02-07 MED ORDER — IOHEXOL 350 MG/ML SOLN
60.0000 mL | Freq: Once | INTRAVENOUS | Status: AC | PRN
Start: 2022-02-07 — End: 2022-02-07
  Administered 2022-02-07: 60 mL via INTRAVENOUS

## 2022-02-07 MED ORDER — ASPIRIN 300 MG RE SUPP
300.0000 mg | Freq: Every day | RECTAL | Status: DC
  Filled 2022-02-07: qty 1

## 2022-02-07 MED ORDER — SENNOSIDES-DOCUSATE SODIUM 8.6-50 MG PO TABS
1.0000 | ORAL_TABLET | Freq: Every evening | ORAL | Status: DC | PRN
  Filled 2022-02-07: qty 1

## 2022-02-07 MED ORDER — SODIUM CHLORIDE 0.9 % IV SOLN: INTRAVENOUS | Status: AC

## 2022-02-07 MED ORDER — STROKE: EARLY STAGES OF RECOVERY BOOK
Freq: Once | Status: AC
  Administered 2022-02-07: 1
  Filled 2022-02-07: qty 1

## 2022-02-07 MED ORDER — INSULIN ASPART 100 UNIT/ML IJ SOLN: 0.0000 [IU] | Freq: Every day | INTRAMUSCULAR | Status: DC

## 2022-02-07 MED ORDER — ASPIRIN 325 MG PO TABS
325.0000 mg | ORAL_TABLET | Freq: Every day | ORAL | Status: DC
  Administered 2022-02-07 – 2022-02-08 (×2): 325 mg via ORAL
  Filled 2022-02-07 (×2): qty 1

## 2022-02-07 MED ORDER — ACETAMINOPHEN 650 MG RE SUPP: 650.0000 mg | RECTAL | Status: DC | PRN

## 2022-02-07 MED ORDER — ACETAMINOPHEN 160 MG/5ML PO SOLN: 650.0000 mg | ORAL | Status: DC | PRN

## 2022-02-07 MED ORDER — HEPARIN SODIUM (PORCINE) 5000 UNIT/ML IJ SOLN
5000.0000 [IU] | Freq: Three times a day (TID) | INTRAMUSCULAR | Status: DC
  Administered 2022-02-07 – 2022-02-08 (×3): 5000 [IU] via SUBCUTANEOUS
  Filled 2022-02-07 (×3): qty 1

## 2022-02-07 MED ORDER — INSULIN ASPART 100 UNIT/ML IJ SOLN
0.0000 [IU] | Freq: Three times a day (TID) | INTRAMUSCULAR | Status: DC
  Administered 2022-02-08: 2 [IU] via SUBCUTANEOUS
  Administered 2022-02-09 (×2): 1 [IU] via SUBCUTANEOUS

## 2022-02-07 NOTE — ED Provider Notes (Addendum)
I was called to triage to evaluate this patient.  Seems that he is an 81 year old male with a history of CVA presented to the emergency room today with abrupt onset of difficulty speaking.  Per his wife who is at bedside patient "just was not saying what he was trying to say" ? ?Seems that he was using the wrong word but per wife was not necessarily slurring his words.  He denies any numbness or weakness during this episode.  He seems to be speaking relatively clearly now although he was unable to identify examiners watch as a watch. ? ?He is neurologically intact on my examination no extremity weakness, neglect ? ?Patient is VAN negative but concern for stroke. Will place orders.  ? ?He will need to be placed immediately in a bed. Discussed briefly with Dr. Darl Householder.  ? ?Nursing staff aware the patient needs to be placed immediately in bed. ?  ?Notably patient is on DOAC and therefore not tPA candidate ? ? ?Tedd Sias, [place] ?02/07/22 1807 ? ?  ?Tedd Sias, [place] ?02/07/22 1820 ? ?  ?Drenda Freeze, MD ?02/07/22 2301 ? ?

## 2022-02-07 NOTE — Consult Note (Signed)
Neurology Consultation ? ?Reason for Consult: stroke ?Referring Physician: Dr Shane Welch ? ?CC: Trouble with speech ? ?History is obtained from: chart wife patient ? ?HPI: Shane Welch is a 81 y.o. 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 ?He reports that he was having trouble making words and doing things and appeared confused. ?Brought to the ER, evaluated initially on the bridge and code stroke not activated but eventually upon provider evaluation concern for mild aphasia for which a code stroke was activated ?Initial last known well was somewhat confusing but I teased out the history and the patient himself confirmed that last known well was either 2 PM or before that. ?By the time code stroke was activated as she was outside the 4 and half hour window ? ? ? ?LKW: 2 PM today ?tpa given?: no, outside the window ?Premorbid modified Rankin scale (mRS): 1 as documented in the last neurology note from Dr. Pearlean Welch ? ?ROS: Full ROS was performed and is negative except as noted in the HPI.  ?Past Medical History:  ?Diagnosis Date  ? CAD (coronary artery disease) 12/10/2015  ? Ant MI 2/17>>DES to LAD // Myoview 12/22: EF 37, no ischemia (int risk)  ? CKD (chronic kidney disease), stage III ([facility]) 10/01/2021  ? Diabetes ([facility]) 12/10/2015  ? Dilated aortic root ([facility]) 09/06/2018  ? Echo 6/17: 42 mm // Echo 10/2018: 43 mm // Echocardiogram 3/22: 42 mm // Echo 12/22: 44 mm  ? Essential hypertension 09/03/2017  ? Hyperlipidemia   ? Ischemic cardiomyopathy   ? s/p Ant MI 2/17 - Echo EF 40-45 // Echo 6/17: EF 55 // Echo 10/2018: EF 50 // Echo 3/22: EF 50-55  ? Paroxysmal atrial fibrillation ([facility]) 09/30/2021  ? S/p CVA in  3/22 >> ILR // AFib IDd on ILR>>anticoagulation started  ? PVC's (premature ventricular contractions) 09/30/2021  ? Monitor 9/22: PVCs 33% // Metoprolol succ started // ILR interr 11/22: PVCs 22.5% // Echo 12/22: Frequent PVCs; EF 45-50, global HK, mild concentric LVH, normal RVSF, trivial MR, trivial AI, aortic root 44 mm  ? 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 Stroke [facility]) 01/07/2021  ? Type II diabetes mellitus ([facility])   ? a. Dx ~ 2010.  ? ?Family History  ?Problem Relation Age of Onset  ? Cancer Father   ?     died in his 35's.  ? Dementia Mother   ?     died in her 74's.  ? ? ? ?Social History:  ? reports that he has quit smoking. His smoking use included cigarettes. He has a 30.00 pack-year smoking history. He has never used smokeless tobacco. He reports current alcohol use. He reports that he does not use drugs. ? ?Medications ?No current facility-administered medications for this encounter. ? ?Current Outpatient Medications:  ?  acetaminophen (TYLENOL) 325 MG tablet, Take 650 mg by mouth every 6 (six) hours as needed for mild pain, moderate pain or headache., Disp: , Rfl:  ?  donepezil (ARICEPT) 23 MG TABS tablet, Take 1 tablet (23 mg total) by mouth at bedtime. Start only after first taking aricept 10 mg daily, Disp: 30 tablet, Rfl: 3 ?  finasteride (PROSCAR) 5 MG tablet, Take 1  tablet by mouth daily., Disp: , Rfl:  ?  glipiZIDE (GLUCOTROL XL) 5 MG 24 hr tablet, Take 5 mg by mouth daily with breakfast., Disp: , Rfl:  ?  metFORMIN (GLUCOPHAGE) 1000 MG tablet, Take 1 tablet (1,000 mg total) by mouth 2 (two) times daily with a meal., Disp: 60 tablet, Rfl: 3 ?  metoprolol succinate (TOPROL XL) 25 MG 24 hr tablet, Take 1 tablet (25 mg total) by mouth at bedtime., Disp: 90 tablet, Rfl: 3 ?  mexiletine (MEXITIL) 250 MG capsule, Take 1 capsule (250 mg total) by mouth 2 (two) times daily., Disp: 60 capsule, Rfl: 3 ?  midodrine (PROAMATINE) 2.5 MG tablet, Take 1 tablet (2.5 mg total) by mouth 3 (three)  times daily with meals., Disp: 30 tablet, Rfl: 0 ?  rivaroxaban (XARELTO) 20 MG TABS tablet, Take 1 tablet (20 mg total) by mouth daily with supper., Disp: 30 tablet, Rfl: 6 ?  rosuvastatin (CRESTOR) 20 MG tablet, Take 1 tablet (20 mg total) by mouth daily., Disp: 90 tablet, Rfl: 3 ?  tamsulosin (FLOMAX) 0.4 MG CAPS capsule, , Disp: , Rfl:  ? ? ?Exam: ?Current vital signs: ?BP (!) 174/74 (BP Location: Left Arm)   Pulse (!) 41   Temp 98.6 ?F (37 ?C) (Oral)   Resp 16   Ht 5\' 11"  (1.803 m)   Wt 92.6 kg   SpO2 100%   BMI 28.47 kg/m?  ?Vital signs in last 24 hours: ?Temp:  [98.6 ?F (37 ?C)] 98.6 ?F (37 ?C) (04/21 1749) ?Pulse Rate:  [41] 41 (04/21 1749) ?Resp:  [16] 16 (04/21 1749) ?BP: (174)/(74) 174/74 (04/21 1749) ?SpO2:  [100 %] 100 % (04/21 1749) ?Weight:  [92.6 kg] 92.6 kg (04/21 1800) ? ?GENERAL: Awake, alert in NAD ?HEENT: - Normocephalic and atraumatic, dry mm, no LN++, no Thyromegally ?LUNGS - Clear to auscultation bilaterally with no wheezes ?CV - S1S2 RRR, no m/r/g, equal pulses bilaterally. ?ABDOMEN - Soft, nontender, nondistended with normoactive BS ?Ext: warm, well perfused, intact peripheral pulses, __ edema ? ?NEURO:  ?Mental Status: AA&Ox3  ?Language: speech is nondysarthric.  Naming, repetition, fluency, and comprehension is grossly intact but he has trouble forming long sentences and somewhat loses fluency with longer sentences. ?Cranial Nerves: PEERL, Shane Welch. EOMI, visual fields full, no facial asymmetry,facial sensation intact, hearing intact, tongue/uvula/soft palate midline, normal sternocleidomastoid and trapezius muscle strength. No evidence of tongue atrophy or fibrillations ?Motor: 5/5 without drift all over ?Tone: is normal and bulk is normal ?Sensation- Intact to light touch bilaterally ?Coordination: FTN intact bilaterally, no ataxia in BLE. ?Gait- deferred ? ?NIHSS--1 ? ? ?Labs ?I have reviewed labs in epic and the results pertinent to this consultation are: ? ?CBC ?   ?Component  Value Date/Time  ? WBC 10.3 02/07/2022 1744  ? RBC 5.13 02/07/2022 1744  ? HGB 16.0 02/07/2022 1831  ? HGB 14.6 08/20/2021 1639  ? HCT 47.0 02/07/2022 1831  ? HCT 43.0 08/20/2021 1639  ? PLT 221 02/07/2022 1744  ? PLT 206 08/20/2021 1639  ? MCV 90.6 02/07/2022 1744  ? MCV 89 08/20/2021 1639  ? MCH 29.6 02/07/2022 1744  ? MCHC 32.7 02/07/2022 1744  ? RDW 13.9 02/07/2022 1744  ? RDW 13.6 08/20/2021 1639  ? LYMPHSABS 3.4 02/07/2022 1744  ? MONOABS 0.9 02/07/2022 1744  ? EOSABS 0.2 02/07/2022 1744  ? BASOSABS 0.1 02/07/2022 1744  ? ? ?CMP  ?   ?Component Value Date/Time  ? NA 141 02/07/2022 1831  ? NA 143 10/23/2021  1423  ? K 4.3 02/07/2022 1831  ? CL 102 02/07/2022 1831  ? CO2 23 10/23/2021 1423  ? GLUCOSE 148 (H) 02/07/2022 1831  ? BUN 26 (H) 02/07/2022 1831  ? BUN 24 10/23/2021 1423  ? CREATININE 1.90 (H) 02/07/2022 1831  ? CALCIUM 8.7 10/23/2021 1423  ? PROT 6.3 10/07/2021 1024  ? ALBUMIN 4.0 10/07/2021 1024  ? AST 18 10/07/2021 1024  ? ALT 14 10/07/2021 1024  ? ALKPHOS 51 10/07/2021 1024  ? BILITOT 0.4 10/07/2021 1024  ? GFRNONAA 41 (L) 01/07/2021 2100  ? GFRAA 50 (L) 11/18/2018 1052  ? ? ?Lipid Panel  ?   ?Component Value Date/Time  ? CHOL 102 10/07/2021 1024  ? TRIG 87 10/07/2021 1024  ? HDL 42 10/07/2021 1024  ? CHOLHDL 2.4 10/07/2021 1024  ? CHOLHDL 5.6 01/08/2021 0630  ? VLDL 33 01/08/2021 0630  ? [place] 43 10/07/2021 1024  ? ? ? ?Imaging ?I have reviewed the images obtained: ? ?CT-head-aspects 10. ?CTA preliminary review with no LVO.  Right P2 severe stenosis versus occlusion.  That is likely chronic.  Bilateral fetal PCAs. ?Assessment:  ?81 year old man past history of stroke with no residual deficits who is supposed to be on anticoagulation but is noncompliant by choice presenting for evaluation of speech difficulty. ?NIH stroke scale-1 for mild aphasia. 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 ?Outside the window for tPA ?NIH 1 precludes intervention-also preliminary review of  CTA with no emergent LVO. ? 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 ?Recommendations: ?Admit to hospitalist ?Frequent neurochecks ?MRI brain without con

## 2022-02-07 NOTE — Code Documentation (Signed)
Pt Shane Welch is a 81 yr old male with a history of CAD CKD DM HTN prior CVA, AF (prescribed DOAC, not taking). He had a sudden onset of difficulty speaking today at 1400. Code stroke activated in ED. CBG WNL. Pt taken to CT at 1845. Pt with very mild word-finding difficulties. Unable to name wristwatch band, or hammock. CTNC neg for acute hemorrhage per Dr Rory Percy. CTA obtained. CTA negative for LVO per Dr Rory Percy. Pt returned to room 22 for remainder of workup. Bedside handoff with Mickle Plumb. Pt will need q 2 hr VS and NIHSS. Permissive HTN up to 220, HOB flat as tolerated. Pt not given TNK as OOW. Pt not IR candidate as LVO negative. ?

## 2022-02-07 NOTE — ED Notes (Signed)
Received patient  in room 22 on cardiac monitor . Patient being assessed by RN Margaretha Sheffield, neuro  nurse . Told by RN per assessment patient has mild aphasia . Face is symmetric . No weakness noted . Airway is clear, respirations are even and non labored. Per MD NIH stroke scale due every 2 hours . Systolic B/P  to be maintained less than 220. Patient passes dysphasia screen . Continuing monitoring .  ?

## 2022-02-07 NOTE — ED Triage Notes (Signed)
Per wife, pt has aphasia and AMSx2 hrs. LKW 1400 today. Pt is A&Ox2 to self and place. Dr Landis Gandy at bedside. ?

## 2022-02-07 NOTE — H&P (Signed)
?History and Physical  ? ? ?Shane Welch [id number] DOB: 27-Jun-1941 DOA: 02/07/2022 ? ?PCP: Leonard Downing, MD  ? ?Patient coming from: Home  ? ?Chief Complaint: Speech difficulty  ? ?HPI: Shane Welch is a pleasant 81 y.o. male with medical history significant for hypertension, hyperlipidemia, type 2 diabetes mellitus, CAD, history of CVA, and paroxysmal atrial fibrillation no longer anticoagulated, presenting to the emergency department for evaluation of acute-onset speech difficulty.  Patient was in his usual state of health and having an uneventful day when he developed speech difficulty at approximately 2 PM.  He describes having difficulty finding the right words to use and family could see that he was struggling to communicate.  He denies any change in vision or hearing, focal numbness or weakness, headache, or palpitations.  Symptoms have since improved in the ED and he feels close to his baseline at this point.   ? 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  He was switched to Xarelto, but elected to just stop taking anticoagulants altogether.  Family notes that he also stopped taking all of his other medications except for his diabetes treatments. ? ?ED Course: Upon arrival to the ED, patient is found to be afebrile and saturating well on room air with elevated blood pressure.  EKG features sinus rhythm with frequent PVCs and LAFB.  Head CT negative for acute intracranial abnormality.  Chemistry panel notable for creatinine 1.84.  Neurology was consulted by the ED physician and hospitalists asked to admit for CVA work-up. ? ?Review of Systems:  ?All other systems reviewed and apart from HPI, are negative. ? ?Past Medical History:  ?Diagnosis Date  ? CAD (coronary artery disease) 12/10/2015  ? Ant MI 2/17>>DES to LAD // Myoview 12/22: EF 37, no ischemia (int risk)  ? CKD (chronic  kidney disease), stage III ([place]) 10/01/2021  ? Diabetes ([place]) 12/10/2015  ? Dilated aortic root ([place]) 09/06/2018  ? Echo 6/17: 42 mm // Echo 10/2018: 43 mm // Echocardiogram 3/22: 42 mm // Echo 12/22: 44 mm  ? Essential hypertension 09/03/2017  ? Hyperlipidemia   ? Ischemic cardiomyopathy   ? s/p Ant MI 2/17 - Echo EF 40-45 // Echo 6/17: EF 55 // Echo 10/2018: EF 50 // Echo 3/22: EF 50-55  ? Paroxysmal atrial fibrillation ([place]) 09/30/2021  ? S/p CVA in 3/22 >> ILR // AFib IDd on ILR>>anticoagulation started  ? PVC's (premature ventricular contractions) 09/30/2021  ? Monitor 9/22: PVCs 33% // Metoprolol succ started // ILR interr 11/22: PVCs 22.5% // Echo 12/22: Frequent PVCs; EF 45-50, global HK, mild concentric LVH, normal RVSF, trivial MR, trivial AI, aortic root 44 mm  ? 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 Stroke [facility]) 01/07/2021  ? Type II diabetes mellitus ([place])   ? a. Dx ~ 2010.  ? ? ?Past Surgical History:  ?Procedure Laterality Date  ? CARDIAC CATHETERIZATION N/A 12/08/2015  ? Procedure: Left Heart Cath and Coronary Angiography;  Surgeon: Jettie Booze, MD;  Location: [facility];  Service: Cardiovascular;  Laterality: N/A;  ? CARDIAC CATHETERIZATION  12/08/2015  ? Procedure: Coronary Stent Intervention;  Surgeon: Jettie Booze, MD;  Location: [place] [facility];  Service: Cardiovascular;;  ? CATARACT EXTRACTION W/PHACO Left 11/13/2021  ? Procedure: CATARACT EXTRACTION PHACO AND INTRAOCULAR LENS PLACEMENT ([place]) LEFT;  Surgeon: Leandrew Koyanagi, MD;  Location: [facility];  Service: Ophthalmology;  Laterality: Left;  9.50 ?01:22.5  ? CATARACT EXTRACTION W/PHACO Right 11/27/2021  ? Procedure: CATARACT EXTRACTION PHACO AND INTRAOCULAR LENS PLACEMENT ([facility]) RIGHT 8.84 01:28.4;  Surgeon: Lockie Mola, MD;  Location: [facility];  Service: Ophthalmology;  Laterality: Right;  ? CERVICAL SPINE SURGERY    ? a. ~ 2002.  ? Right Knee Surgery    ? a. 08/2015.   ? ? ?Social History:  ? reports that he has quit smoking. His smoking use included cigarettes. He has a 30.00 pack-year smoking history. He has never used smokeless tobacco. He reports current alcohol use. He reports that he does not use drugs. ? ?Allergies  ?Allergen Reactions  ? Sulfa Antibiotics Hives  ? ? ?Family History  ?Problem Relation Age of Onset  ? Cancer Father   ?     died in his 50's.  ? Dementia Mother   ?     died in her 76's.  ? ? ? ?Prior to Admission medications   ?Medication Sig Start Date End Date Taking? Authorizing Provider  ?acetaminophen (TYLENOL) 325 MG tablet Take 650 mg by mouth every 6 (six) hours as needed for mild pain, moderate pain or headache.    [provider]  ?donepezil (ARICEPT) 23 MG TABS tablet Take 1 tablet (23 mg total) by mouth at bedtime. Start only after first taking aricept 10 mg daily 11/11/21   Micki Riley, MD  ?finasteride (PROSCAR) 5 MG tablet Take 1 tablet by mouth daily. 01/21/21   [provider]  ?glipiZIDE (GLUCOTROL XL) 5 MG 24 hr tablet Take 5 mg by mouth daily with breakfast.    [provider]  ?metFORMIN (GLUCOPHAGE) 1000 MG tablet Take 1 tablet (1,000 mg total) by mouth 2 (two) times daily with a meal. 01/11/21   Mariea Clonts, Courage, MD  ?metoprolol succinate (TOPROL XL) 25 MG 24 hr tablet Take 1 tablet (25 mg total) by mouth at bedtime. 10/01/21 10/01/22  Tereso Newcomer T, PA-C  ?mexiletine (MEXITIL) 250 MG capsule Take 1 capsule (250 mg total) by mouth 2 (two) times daily. 11/22/21   Camnitz, Andree Coss, MD  ?midodrine (PROAMATINE) 2.5 MG tablet Take 1 tablet (2.5 mg total) by mouth 3 (three) times daily with meals. 02/19/21   Micki Riley, MD  ?rivaroxaban (XARELTO) 20 MG TABS tablet Take 1 tablet (20 mg total) by mouth daily with supper. 11/08/21   Camnitz, Andree Coss, MD  ?rosuvastatin (CRESTOR) 20 MG tablet Take 1 tablet (20 mg total) by mouth daily. 05/13/21   Corky Crafts, MD  ?tamsulosin [facility]) 0.4 MG CAPS capsule   10/29/20   [provider]  ? ? ?Physical Exam: ?Vitals:  ? 02/07/22 1749 02/07/22 1800 02/07/22 1900 02/07/22 1910  ?BP: (!) 174/74  (!) 158/82 (!) 193/65  ?Pulse: (!) 41   84  ?Resp: 16   18  ?Temp: 98.6 ?F (37 ?C)   98.1 ?F (36.7 ?C)  ?TempSrc: Oral   Oral  ?SpO2: 100%   100%  ?Weight:  92.6 kg    ?Height:  5\' 11"  (1.803 m)    ? ? ?Constitutional: NAD, calm  ?Eyes: PERTLA, lids and conjunctivae normal ?ENMT: Mucous membranes are moist. Posterior pharynx clear of any exudate or lesions.   ?Neck: supple, no masses  ?Respiratory: no wheezing, no crackles. No accessory muscle use.  ?Cardiovascular: S1 & S2 heard, regular rate and rhythm. No extremity edema.   ?Abdomen: No distension, no tenderness,  soft. Bowel sounds active.  ?Musculoskeletal: no clubbing / cyanosis. No joint deformity upper and lower extremities.   ?Skin: no significant rashes, lesions, ulcers. Warm, dry, well-perfused. ?Neurologic: CN 2-12 grossly intact. Sensation intact. Strength 5/5 in all 4 limbs. Alert and oriented.  ?Psychiatric: Very pleasant. Cooperative.  ? ? ?Labs and Imaging on Admission: I have personally reviewed following labs and imaging studies ? ?CBC: ?Recent Labs  ?Lab 02/07/22 ?1744 02/07/22 ?1831  ?WBC 10.3  --   ?NEUTROABS 5.8  --   ?HGB 15.2 16.0  ?HCT 46.5 47.0  ?MCV 90.6  --   ?PLT 221  --   ? ?Basic Metabolic Panel: ?Recent Labs  ?Lab 02/07/22 ?1744 02/07/22 ?1831  ?NA 140 141  ?K 4.4 4.3  ?CL 104 102  ?CO2 26  --   ?GLUCOSE 151* 148*  ?BUN 20 26*  ?CREATININE 1.84* 1.90*  ?CALCIUM 8.9  --   ? ?GFR: ?Estimated Creatinine Clearance: 36.1 mL/min (A) (by C-G formula based on SCr of 1.9 mg/dL (H)). ?Liver Function Tests: ?Recent Labs  ?Lab 02/07/22 ?[id number]  ?AST 24  ?ALT 16  ?ALKPHOS 46  ?BILITOT 0.7  ?PROT 6.6  ?ALBUMIN 3.6  ? ?No results for input(s): LIPASE, AMYLASE in the last 168 hours. ?No results for input(s): AMMONIA in the last 168 hours. ?Coagulation Profile: ?Recent Labs  ?Lab 02/07/22 ?[id number]  ?INR 1.0   ? ?Cardiac Enzymes: ?No results for input(s): CKTOTAL, CKMB, CKMBINDEX, TROPONINI in the last 168 hours. ?BNP (last 3 results) ?No results for input(s): PROBNP in the last 8760 hours. ?HbA1C: ?No results for input(s)

## 2022-02-07 NOTE — ED Provider Notes (Signed)
?[facility] ?Provider Note ? ? ?CSN: [id number] ?Arrival date & time: 02/07/22  1744 ? ?  ? ?History ? ?Chief Complaint  ?Patient presents with  ? Stroke Symptoms  ? ? ?Hanzo Flyte is a 81 y.o. male. ? ?81 yo M with a chief complaint of sudden onset aphasia.  He thinks this started about 4 hours ago.  Has had a stroke in the past that was more dizziness.  Denies any head injury denies headaches.  Level 5 caveat acuity of condition. ? ? ? ?  ? ?Home Medications ?Prior to Admission medications   ?Medication Sig Start Date End Date Taking? Authorizing Provider  ?acetaminophen (TYLENOL) 325 MG tablet Take 650 mg by mouth every 6 (six) hours as needed for mild pain, moderate pain or headache.    [provider]  ?donepezil (ARICEPT) 23 MG TABS tablet Take 1 tablet (23 mg total) by mouth at bedtime. Start only after first taking aricept 10 mg daily 11/11/21   Garvin Fila, MD  ?finasteride (PROSCAR) 5 MG tablet Take 1 tablet by mouth daily. 01/21/21   [provider]  ?glipiZIDE (GLUCOTROL XL) 5 MG 24 hr tablet Take 5 mg by mouth daily with breakfast.    [provider]  ?metFORMIN (GLUCOPHAGE) 1000 MG tablet Take 1 tablet (1,000 mg total) by mouth 2 (two) times daily with a meal. 01/11/21   Denton Brick, Courage, MD  ?metoprolol succinate (TOPROL XL) 25 MG 24 hr tablet Take 1 tablet (25 mg total) by mouth at bedtime. 10/01/21 10/01/22  Richardson Dopp T, PA-C  ?mexiletine (MEXITIL) 250 MG capsule Take 1 capsule (250 mg total) by mouth 2 (two) times daily. 11/22/21   Camnitz, Ocie Doyne, MD  ?midodrine (PROAMATINE) 2.5 MG tablet Take 1 tablet (2.5 mg total) by mouth 3 (three) times daily with meals. 02/19/21   Garvin Fila, MD  ?rivaroxaban (XARELTO) 20 MG TABS tablet Take 1 tablet (20 mg total) by mouth daily with supper. 11/08/21   Camnitz, Ocie Doyne, MD  ?rosuvastatin (CRESTOR) 20 MG tablet Take 1 tablet (20 mg total) by mouth daily. 05/13/21   Jettie Booze, MD  ?tamsulosin [facility]) 0.4 MG CAPS capsule  10/29/20   [provider]  ?   ? ?Allergies    ?Sulfa antibiotics   ? ?Review of Systems   ?Review of Systems ? ?Physical Exam ?Updated Vital Signs ?BP (!) 193/65 (BP Location: Left Arm)   Pulse 84   Temp 98.1 ?F (36.7 ?C) (Oral)   Resp 18   Ht 5\' 11"  (1.803 m)   Wt 92.6 kg   SpO2 100%   BMI 28.47 kg/m?  ?Physical Exam ?Vitals and nursing note reviewed.  ?Constitutional:   ?   Appearance: He is well-developed.  ?HENT:  ?   Head: Normocephalic and atraumatic.  ?Eyes:  ?   Pupils: Pupils are equal, round, and reactive to light.  ?Neck:  ?   Vascular: No JVD.  ?Cardiovascular:  ?   Rate and Rhythm: Normal rate and regular rhythm.  ?   Heart sounds: No murmur heard. ?  No friction rub. No gallop.  ?Pulmonary:  ?   Effort: No respiratory distress.  ?   Breath sounds: No wheezing.  ?Abdominal:  ?   General: There is no distension.  ?   Tenderness: There is no abdominal tenderness. There is no guarding or rebound.  ?Musculoskeletal:     ?   General: Normal range of  motion.  ?   Cervical back: Normal range of motion and neck supple.  ?Skin: ?   Coloration: Skin is not pale.  ?   Findings: No rash.  ?Neurological:  ?   Mental Status: He is alert and oriented to person, place, and time.  ?   Comments: Broca's aphasia.  Left upper extremity weakness compared to right especially with shoulder shrug.  ?Psychiatric:     ?   Behavior: Behavior normal.  ? ? ?ED Results / Procedures / Treatments   ?Labs ?(all labs ordered are listed, but only abnormal results are displayed) ?Labs Reviewed  ?I-STAT CHEM 8, ED - Abnormal; Notable for the following components:  ?    Result Value  ? BUN 26 (*)   ? Creatinine, Ser 1.90 (*)   ? Glucose, Bld 148 (*)   ? Calcium, Ion 1.01 (*)   ? All other components within normal limits  ?CBG MONITORING, ED - Abnormal; Notable for the following components:  ? Glucose-Capillary 191 (*)   ? All other components within normal limits  ?RESP PANEL  BY RT-PCR (FLU A&B, COVID) [id number]  ?PROTIME-INR  ?APTT  ?CBC  ?DIFFERENTIAL  ?ETHANOL  ?COMPREHENSIVE METABOLIC PANEL  ?RAPID URINE DRUG SCREEN, HOSP PERFORMED  ?URINALYSIS, ROUTINE W REFLEX MICROSCOPIC  ? ? ?EKG ?None ? ?Radiology ?CT HEAD WO CONTRAST (5MM) ? ?Result Date: 02/07/2022 ?CLINICAL DATA:  Stroke suspected, aphasia EXAM: CT HEAD WITHOUT CONTRAST TECHNIQUE: Contiguous axial images were obtained from the base of the skull through the vertex without intravenous contrast. RADIATION DOSE REDUCTION: This exam was performed according to the departmental dose-optimization program which includes automated exposure control, adjustment of the mA and/or kV according to patient size and/or use of iterative reconstruction technique. COMPARISON:  01/07/2021 FINDINGS: Brain: No evidence of acute infarction, hemorrhage, cerebral edema, mass, mass effect, or midline shift. No hydrocephalus or extra-axial fluid collection. Age-related cerebral atrophy. Periventricular white matter changes, likely the sequela of chronic small vessel ischemic disease. Vascular: No hyperdense vessel or unexpected calcification. Skull: Normal. Negative for fracture or focal lesion. Sinuses/Orbits: No acute finding. Status post bilateral lens replacements. Other: The mastoid air cells are well aerated. ASPECTS [facility] Stroke Program Early CT Score) - Ganglionic level infarction (caudate, lentiform nuclei, internal capsule, insula, M1-M3 cortex): 7 - Supraganglionic infarction (M4-M6 cortex): 3 Total score (0-10 with 10 being normal): 10 IMPRESSION: IMPRESSION No acute intracranial process. No evidence of acute infarct or hemorrhage. Code stroke imaging results were communicated on 02/07/2022 at 6:53 pm to provider Dr. Rory Percy via secure text paging. Electronically Signed   By: Merilyn Baba M.D.   On: 02/07/2022 18:53   ? ?Procedures ?Procedures  ? ? ?Medications Ordered in ED ?Medications  ?iohexol (OMNIPAQUE) 350 MG/ML injection 60 mL (60 mLs  Intravenous Contrast Given 02/07/22 1855)  ? ? ?ED Course/ Medical Decision Making/ A&P ?  ?                        ?Medical Decision Making ? ?81 yo M with a chief complaints of sudden onset neurologic symptoms.  Patient was seen in the quick look process and was immediately sent back to her room.  Code stroke was not initially activated due to the history that the patient is on a blood thinner.  On further discussion with the patient and family the patient has been off of that medication for at least 3 months.  Will activate a code stroke. ?3 ?Patient was  evaluated by neurology.  CT scan of the head without intracranial hemorrhage is independently interpreted by me.  Dr. Rory Percy discussed the case with me and felt it was most likely an embolic stroke.  Recommended medical admission. ? ?The patients results and plan were reviewed and discussed.   ?Any x-rays performed were independently reviewed by myself.  ? ?Differential diagnosis were considered with the presenting HPI. ? ?Medications  ?iohexol (OMNIPAQUE) 350 MG/ML injection 60 mL (60 mLs Intravenous Contrast Given 02/07/22 1855)  ? ? ?Vitals:  ? 02/07/22 1749 02/07/22 1800 02/07/22 1910  ?BP: (!) 174/74  (!) 193/65  ?Pulse: (!) 41  84  ?Resp: 16  18  ?Temp: 98.6 ?F (37 ?C)  98.1 ?F (36.7 ?C)  ?TempSrc: Oral  Oral  ?SpO2: 100%  100%  ?Weight:  92.6 kg   ?Height:  5\' 11"  (1.803 m)   ? ? ?Final diagnoses:  ?Aphasia  ? ? ?Admission/ observation were discussed with the admitting physician, patient and/or family and they are comfortable with the plan.  ? ? ? ? ? ? ? ? ? ? ? ? ?Final Clinical Impression(s) / ED Diagnoses ?Final diagnoses:  ?Aphasia  ? ? ?Rx / DC Orders ?ED Discharge Orders   ? ? None  ? ?  ? ? ?  ?Deno Etienne, DO ?02/07/22 1912 ? ?

## 2022-02-08 ENCOUNTER — Observation Stay (HOSPITAL_BASED_OUTPATIENT_CLINIC_OR_DEPARTMENT_OTHER): Payer: Medicare PPO

## 2022-02-08 ENCOUNTER — Observation Stay (HOSPITAL_COMMUNITY): Payer: Medicare PPO

## 2022-02-08 DIAGNOSIS — I10 Essential (primary) hypertension: Secondary | ICD-10-CM

## 2022-02-08 DIAGNOSIS — I639 Cerebral infarction, unspecified: Secondary | ICD-10-CM

## 2022-02-08 DIAGNOSIS — E785 Hyperlipidemia, unspecified: Secondary | ICD-10-CM

## 2022-02-08 DIAGNOSIS — E119 Type 2 diabetes mellitus without complications: Secondary | ICD-10-CM | POA: Diagnosis not present

## 2022-02-08 DIAGNOSIS — I480 Paroxysmal atrial fibrillation: Secondary | ICD-10-CM | POA: Diagnosis not present

## 2022-02-08 DIAGNOSIS — R29818 Other symptoms and signs involving the nervous system: Secondary | ICD-10-CM | POA: Diagnosis not present

## 2022-02-08 LAB — LIPID PANEL
Cholesterol: 180 mg/dL (ref 0–200)
HDL: 28 mg/dL — ABNORMAL LOW (ref >40)
LDL Cholesterol: 119 mg/dL — ABNORMAL HIGH (ref 0–99)
Total CHOL/HDL Ratio: 6.4 RATIO
Triglycerides: 165 mg/dL — ABNORMAL HIGH (ref <150)
VLDL: 33 mg/dL (ref 0–40)

## 2022-02-08 LAB — CBC
HCT: 40 % (ref 39.0–52.0)
Hemoglobin: 13.2 g/dL (ref 13.0–17.0)
MCH: 29.5 pg (ref 26.0–34.0)
MCHC: 33 g/dL (ref 30.0–36.0)
MCV: 89.3 fL (ref 80.0–100.0)
Platelets: 185 10*3/uL (ref 150–400)
RBC: 4.48 MIL/uL (ref 4.22–5.81)
RDW: 13.8 % (ref 11.5–15.5)
WBC: 10 10*3/uL (ref 4.0–10.5)
nRBC: 0 % (ref 0.0–0.2)

## 2022-02-08 LAB — URINALYSIS, ROUTINE W REFLEX MICROSCOPIC
Bilirubin Urine: NEGATIVE
Glucose, UA: NEGATIVE mg/dL
Hgb urine dipstick: NEGATIVE
Ketones, ur: NEGATIVE mg/dL
Leukocytes,Ua: NEGATIVE
Nitrite: NEGATIVE
Protein, ur: NEGATIVE mg/dL
Specific Gravity, Urine: 1.011 (ref 1.005–1.030)
pH: 6 (ref 5.0–8.0)

## 2022-02-08 LAB — GLUCOSE, CAPILLARY
Glucose-Capillary: 90 mg/dL (ref 70–99)
Glucose-Capillary: 173 mg/dL — ABNORMAL HIGH (ref 70–99)
Glucose-Capillary: 91 mg/dL (ref 70–99)
Glucose-Capillary: 131 mg/dL — ABNORMAL HIGH (ref 70–99)

## 2022-02-08 LAB — RAPID URINE DRUG SCREEN, HOSP PERFORMED
Amphetamines: NOT DETECTED
Barbiturates: NOT DETECTED
Benzodiazepines: NOT DETECTED
Cocaine: NOT DETECTED
Opiates: NOT DETECTED
Tetrahydrocannabinol: NOT DETECTED

## 2022-02-08 LAB — COMPREHENSIVE METABOLIC PANEL
ALT: 15 U/L (ref 0–44)
AST: 19 U/L (ref 15–41)
Albumin: 3.1 g/dL — ABNORMAL LOW (ref 3.5–5.0)
Alkaline Phosphatase: 38 U/L (ref 38–126)
Anion gap: 11 (ref 5–15)
BUN: 22 mg/dL (ref 8–23)
CO2: 21 mmol/L — ABNORMAL LOW (ref 22–32)
Calcium: 8.2 mg/dL — ABNORMAL LOW (ref 8.9–10.3)
Chloride: 110 mmol/L (ref 98–111)
Creatinine, Ser: 1.76 mg/dL — ABNORMAL HIGH (ref 0.61–1.24)
GFR, Estimated: 39 mL/min — ABNORMAL LOW (ref >60)
Glucose, Bld: 80 mg/dL (ref 70–99)
Potassium: 4.1 mmol/L (ref 3.5–5.1)
Sodium: 142 mmol/L (ref 135–145)
Total Bilirubin: 0.7 mg/dL (ref 0.3–1.2)
Total Protein: 5.7 g/dL — ABNORMAL LOW (ref 6.5–8.1)

## 2022-02-08 LAB — ECHOCARDIOGRAM COMPLETE
Height: 71 in
S' Lateral: 4.1 cm
Weight: 3361.57 oz

## 2022-02-08 LAB — HEMOGLOBIN A1C
Hgb A1c MFr Bld: 6.6 % — ABNORMAL HIGH (ref 4.8–5.6)
Mean Plasma Glucose: 142.72 mg/dL

## 2022-02-08 LAB — PHOSPHORUS: Phosphorus: 3.6 mg/dL (ref 2.5–4.6)

## 2022-02-08 LAB — MAGNESIUM: Magnesium: 1.4 mg/dL — ABNORMAL LOW (ref 1.7–2.4)

## 2022-02-08 MED ORDER — RIVAROXABAN 15 MG PO TABS
15.0000 mg | ORAL_TABLET | Freq: Every day | ORAL | Status: DC
  Administered 2022-02-08 – 2022-02-09 (×2): 15 mg via ORAL
  Filled 2022-02-08 (×2): qty 1

## 2022-02-08 MED ORDER — METOPROLOL SUCCINATE ER 25 MG PO TB24
25.0000 mg | ORAL_TABLET | Freq: Every day | ORAL | Status: DC
  Administered 2022-02-08: 25 mg via ORAL
  Filled 2022-02-08: qty 1

## 2022-02-08 MED ORDER — RIVAROXABAN 20 MG PO TABS: 20.0000 mg | ORAL_TABLET | Freq: Every day | ORAL | Status: DC

## 2022-02-08 MED ORDER — ROSUVASTATIN CALCIUM 20 MG PO TABS
40.0000 mg | ORAL_TABLET | Freq: Every day | ORAL | Status: DC
Start: 2022-02-08 — End: 2022-02-09
  Administered 2022-02-08: 40 mg via ORAL
  Filled 2022-02-08: qty 2

## 2022-02-08 MED ORDER — MAGNESIUM SULFATE 2 GM/50ML IV SOLN
2.0000 g | Freq: Once | INTRAVENOUS | Status: AC
  Administered 2022-02-08: 2 g via INTRAVENOUS
  Filled 2022-02-08: qty 50

## 2022-02-08 NOTE — Evaluation (Signed)
Speech Language Pathology Evaluation ?Patient Details ?Name: Shane Welch ?MRN: [id number] ?DOB: 08/02/1941 ?Today's Date: 02/08/2022 ?Time: [id number] ?SLP Time Calculation (min) (ACUTE ONLY): 15 min ? ?Problem List:  ?Patient Active Problem List  ? Diagnosis Date Noted  ? HLD (hyperlipidemia) 02/08/2022  ? Hypomagnesemia 02/08/2022  ? TIA (transient ischemic attack) 02/07/2022  ? 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 Fatigue 12/03/2021  ? Stage 3b chronic kidney disease (CKD) ([place]) 10/01/2021  ? PVC's (premature ventricular contractions) 09/30/2021  ? Hypotension 09/30/2021  ? Paroxysmal atrial fibrillation ([place]) 09/30/2021  ? Memory loss 07/10/2021  ? Acute CVA (cerebrovascular accident)/Rt occipital Stroke  01/09/2021  ? Hypertensive emergency 01/08/2021  ? Dilated aortic root ([place]) 09/06/2018  ? Essential hypertension 09/03/2017  ? 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 Abnormal echocardiogram 03/11/2016  ? CAD (coronary artery disease) 12/10/2015  ? Hyperlipidemia 12/10/2015  ? Diabetes mellitus type II, non insulin dependent ([place]) 12/10/2015  ? Acute MI anterior wall first episode care [facility])   ? ?Past Medical History:  ?Past Medical History:  ?Diagnosis Date  ? CAD (coronary artery disease) 12/10/2015  ? Ant MI 2/17>>DES to LAD // Myoview 12/22: EF 37, no ischemia (int risk)  ? CKD (chronic kidney disease), stage III ([place]) 10/01/2021  ? Diabetes ([place]) 12/10/2015  ? Dilated aortic root ([place]) 09/06/2018  ? Echo 6/17: 42 mm // Echo 10/2018: 43 mm // Echocardiogram 3/22: 42 mm // Echo 12/22: 44 mm  ? Essential hypertension 09/03/2017  ? Hyperlipidemia   ? Ischemic cardiomyopathy   ? s/p Ant MI 2/17 - Echo EF 40-45 // Echo 6/17: EF 55 // Echo 10/2018: EF 50 // Echo 3/22: EF 50-55  ? Paroxysmal atrial fibrillation ([place]) 09/30/2021  ? S/p CVA in 3/22 >> ILR // AFib IDd on ILR>>anticoagulation started  ? PVC's (premature ventricular contractions) 09/30/2021  ? Monitor 9/22: PVCs 33% // Metoprolol  succ started // ILR interr 11/22: PVCs 22.5% // Echo 12/22: Frequent PVCs; EF 45-50, global HK, mild concentric LVH, normal RVSF, trivial MR, trivial AI, aortic root 44 mm  ? 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 Stroke [facility]) 01/07/2021  ? Type II diabetes mellitus ([place])   ? a. Dx ~ 2010.  ? ?Past Surgical History:  ?Past Surgical History:  ?Procedure Laterality Date  ? CARDIAC CATHETERIZATION N/A 12/08/2015  ? Procedure: Left Heart Cath and Coronary Angiography;  Surgeon: Jettie Booze, MD;  Location: [facility];  Service: Cardiovascular;  Laterality: N/A;  ? CARDIAC CATHETERIZATION  12/08/2015  ? Procedure: Coronary Stent Intervention;  Surgeon: Jettie Booze, MD;  Location: [facility];  Service: Cardiovascular;;  ? CATARACT EXTRACTION W/PHACO Left 11/13/2021  ? Procedure: CATARACT EXTRACTION PHACO AND INTRAOCULAR LENS PLACEMENT ([place]) LEFT;  Surgeon: Leandrew Koyanagi, MD;  Location: [facility];  Service: Ophthalmology;  Laterality: Left;  9.50 ?01:22.5  ? CATARACT EXTRACTION W/PHACO Right 11/27/2021  ? Procedure: CATARACT EXTRACTION PHACO AND INTRAOCULAR LENS PLACEMENT ([place]) RIGHT 8.84 01:28.4;  Surgeon: Leandrew Koyanagi, MD;  Location: [facility];  Service: Ophthalmology;  Laterality: Right;  ? CERVICAL SPINE SURGERY    ? a. ~ 2002.  ? Right Knee Surgery    ? a. 08/2015.  ? ?HPI:  ?Patient is an 81 y.o. male who presented with acute-onset speech difficulty. Stroke workup negative for acute findings. Pt with medical history significant for hypertension, hyperlipidemia, type 2 diabetes mellitus, CAD, history of CVA, and paroxysmal atrial fibrillation  no longer anticoagulated.  ? ?Assessment / Plan / Recommendation ?Clinical Impression ? Patient presents with moderate cognitive impairment as per this evaluation however based on his baseline cognitive deficits and wife's report, he is likely at or near baseline. She reported that the only change she has  noticed is that at home he does start sundowning "at night" but while he has been here in the hospital, his mentation has been fluctuating every 1-2 hours. 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 Of note, about 3/4 into testing, patient abruptly asking purpose of test and asking SLP's "affiliation" with the hospital and seemed to be getting a little agitated, asking how long it was going to take. His wife then did interject that they were under the impression that he might be able to go home today and he has been thrown off by having to get all the different tests today. Patient was able to recall that a doctor was in here earlier and that he "was mad that I had stopped taking my medicine" and that the doctor (Dr. Leonie Man) did help patient understand importance of taking medications from now on. SLP is not recommending further therapy services at this time as patient is likely at or near his baseline. ?   ?SLP Assessment ? SLP Recommendation/Assessment: Patient does not need any further [place] [facility] ?SLP Visit Diagnosis: Cognitive communication deficit (R41.841)  ?  ?Recommendations for follow up therapy are one component of a multi-disciplinary discharge planning process, led by the attending physician.  Recommendations may be updated based on patient status, additional functional criteria and insurance authorization. ?   ?Follow Up Recommendations ? No SLP follow up  ?  ?Assistance Recommended at Discharge ? None  ?Functional Status Assessment Patient has had a recent decline in their functional status and demonstrates the ability to make significant improvements in function in a reasonable and predictable amount of time.  ?Frequency and Duration    ?  ?  ?   ?SLP Evaluation ?Cognition ? Overall Cognitive Status: History of cognitive impairments - at baseline ?Arousal/Alertness:  Awake/alert ?Orientation Level: Oriented X4 ?Year: 2023 ?Month: April ?Day of Week: Correct ?Attention: Sustained ?Sustained Attention: Appears intact ?Memory: Impaired ?Memory Impairment: Retrieval deficit ?Awareness: Impaired ?Awareness Impairment: Intellectual impairment;Emergent impairment ?Problem Solving: Impaired ?Problem Solving Impairment: Verbal complex ?Executive Function: Self Monitoring ?Self Monitoring: Impaired ?Self Monitoring Impairment: Verbal complex ?Safety/Judgment: Impaired  ?  ?   ?Comprehension ? Auditory Comprehension ?Overall Auditory Comprehension: Other (comment) (WFL at basic level)  ?  ?Expression Expression ?Primary Mode of Expression: Verbal ?Verbal Expression ?Overall Verbal Expression: Appears within functional limits for tasks assessed   ?Oral / Motor ? Oral Motor/Sensory Function ?Overall Oral Motor/Sensory Function: Within functional limits ?Motor Speech ?Overall Motor Speech: Appears within functional limits for tasks assessed   ?        ? ?Sonia Baller, MA, CCC-SLP ?Speech Therapy ? ? ?

## 2022-02-08 NOTE — Assessment & Plan Note (Addendum)
Metabolic Acidosis ?-SCr is 1.84 on admission, previously in 1.5-1.8 range  ?-Avoid nephrotoxic medications, contrast dyes, hypotension and dehydration and make sure patient has adequate renal perfusion and renally adjust medications ?-Patient's BUN/creatinine has gone from 20/1.84 -> 26/1.90 -> 22/1.76 -> 19/1.62 ?-The patient has a slight metabolic acidosis with a CO2 of 21, anion gap of 11, chloride level of 110 ?-Was given IV fluid hydration with normal saline at 75 MLS per hour for 12 hours and this is now stopped ?-Repeat CMP in the a.m. ?

## 2022-02-08 NOTE — Assessment & Plan Note (Addendum)
-  A1c was 6.8% in March 2022   ?-Repeat hemoglobin A1c here is now 6.6 ?-We will continue to monitor CBGs, use low-intensity SSI for now and adjust insulin regimen as necessary ?-Hold his home medications with glipizide 5 mg p.o. with breakfast and metformin 1000 mg p.o. twice daily and resume at D/C ?

## 2022-02-08 NOTE — Hospital Course (Addendum)
The patient is an 81 year old overweight male with a past medical history significant for but not due to hypertension, hyperlipidemia, diabetes mellitus type 2, history of CAD, history of CVA, history of PAF no longer on anticoagulation due to nosebleeds as well as other comorbidities who presented to the emergency department for the evaluation of acute onset speech difficulty.  He was in his usual state of health and had an unintelligible day when he developed speech difficulty at 2 PM.  He described having difficulty finding the right words to use and family can see he was struggling to communicate.  He denies any changes in vision, or hearing and denies any focal numbness or weakness, headache or palpitations.  Symptoms have since improved in the ED and he felt close to baseline at this point.  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  He was switched to Xarelto but elected to stop taking anticoagulants altogether.  Family also notes that he also stopped taking all of his other medications except for diabetes medications.  Upon arrival to the ED patient was found to be afebrile and saturating well on room air with an elevated blood pressure.  EKG showed sinus rhythm with frequent PVCs and LAFB.  Head CT was negative for any acute intracranial abnormalities.  Chemistry panel noted a creatinine of 1.8 and neurology was consulted and he was admitted for stroke work-up.  Currently neurology is recommending frequent neurochecks and monitor on telemetry with an MRI of the brain without contrast given that he has a implantable recorder which is MRI safe.  They also recommending a 2D echocardiogram, A1c, lipid panel and initiating aspirin 325 for now and depending on what the MRI shows there recommending that the timing of anticoagulation resumption to be  determined by the stroke team.  PT OT has been consulted for further evaluation and they are recommending allowing for permissive hypertension. ? ?PT and OT recommending home health.  MRI did not show any evidence of acute CVA so EEG was ordered given that the patient could have had a focal seizure but EEG showed no seizure activity..  Neurology recommended that he may need to go back on his dementia medications but patient has refused Aricept and neurology recommends considering Namenda as an outpatient.  Patient was counseled about compliance with medications and was discharged in stable condition only to follow-up with his PCP, cardiology as well as neurology in outpatient setting. ?

## 2022-02-08 NOTE — Progress Notes (Signed)
?PROGRESS NOTE ? ? ? ?Shane Welch  [id number] DOB: 1941-02-26 DOA: 02/07/2022 ?PCP: Leonard Downing, MD  ? ?Brief Narrative:  ?The patient is an 81 year old overweight male with a past medical history significant for but not due to hypertension, hyperlipidemia, diabetes mellitus type 2, history of CAD, history of CVA, history of PAF no longer on anticoagulation due to nosebleeds as well as other comorbidities who presented to the emergency department for the evaluation of acute onset speech difficulty.  He was in his usual state of health and had an unintelligible day when he developed speech difficulty at 2 PM.  He described having difficulty finding the right words to use and family can see he was struggling to communicate.  He denies any changes in vision, or hearing and denies any focal numbness or weakness, headache or palpitations.  Symptoms have since improved in the ED and he felt close to baseline at this point.  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  He was switched to Xarelto but elected to stop taking anticoagulants altogether.  Family also notes that he also stopped taking all of his other medications except for diabetes medications.  Upon arrival to the ED patient was found to be afebrile and saturating well on room air with an elevated blood pressure.  EKG showed sinus rhythm with frequent PVCs and LAFB.  Head CT was negative for any acute intracranial abnormalities.  Chemistry panel noted a creatinine of 1.8 and neurology was consulted and he was admitted for stroke work-up.  Currently neurology is recommending frequent neurochecks and monitor on telemetry with an MRI of the brain without contrast given that he has a implantable recorder which is MRI safe.  They also recommending a 2D echocardiogram, A1c, lipid panel and  initiating aspirin 325 for now and depending on what the MRI shows there recommending that the timing of anticoagulation resumption to be determined by the stroke team.  PT OT has been consulted for further evaluation and they are recommending allowing for permissive hypertension. ? ?PT and OT recommending home health.  MRI did not show any evidence of acute CVA so EEG was ordered given that the patient could have ad a focal seizure.   ? ?Assessment and Plan: ?* TIA (transient ischemic attack) ?Vs. Focal Seizure  ?-Pt with PAF no longer taking anticoagulation p/w -He had acute-onset aphasia  ?-No acute finding on head CT ?-Appreciate neurology consultation, ?-Plan to start ASA 325 for now,  ?-Check MRI brain without Contrast ?-Check TTE done and showed "Left ventricular ejection fraction, by estimation, is 40 to 45%. The  left ventricle has mildly decreased function. The left ventricle  demonstrates global hypokinesis. There is mild left ventricular  hypertrophy. Left ventricular diastolic function  could not be evaluated. Right ventricular systolic function is normal. The right ventricular size is normal. Tricuspid regurgitation signal is inadequate for assessing PA pressure.  Left atrial size was moderately dilated. The mitral valve is abnormal. Trivial mitral valve regurgitation. The aortic valve is tricuspid. Aortic valve regurgitation is not  visualized. Aortic dilatation noted. There is borderline dilatation of the aortic root, measuring 42 mm. The inferior vena cava is normal in size with greater than 50%  respiratory variability, suggesting right atrial pressure of 3 mmHg.   Rhythm strip during this exam demostrated atrial fibrillation."  ?-  Checked Lipid panel and showed a total cholesterol/HDL ratio of 6.4, cholesterol level 180, HDL 28, LDL 119, triglycerides 165, VLDL 33 ?-Hemoglobin A1c was 6.6 ?-Permit HTN, consult PT/OT/SLP and they are recommending Home Health  ?-Continue neuro checks per protocol ,   ?-Kept NPO pending swallow eval but he tolerated bedside swallow studies placed on a heart healthy diet, ?-Resume anticoagulation with timing based on MRI findings  per neurology recommendations however the MRI showed "No acute finding. Generalized brain atrophy. Remote right occipital cortex infarct." ?  ? ?Hypomagnesemia ?- Patient had a mag level 1.4 ?-We will replete with IV mag sulfate 2 g ?-Continue monitor and replete as necessary ?-Repeat magnesium level in the a.m. ? ?HLD (hyperlipidemia) ?- Lipid panel as delineated in acute CVA ?-Continue with statin but increase the rosuvastatin from 20 mg to 40 mg daily ? ?Stage 3b chronic kidney disease (CKD) ([place]) ?Metabolic Acidosis ?-SCr is 1.84 on admission, previously in 1.5-1.8 range  ?-Avoid nephrotoxic medications, contrast dyes, hypotension and dehydration and make sure patient has adequate renal perfusion and renally adjust medications ?-Patient's BUN/creatinine has gone from 20/1.84 -> 26/1.90 -> 22/1.76 ?-The patient has a slight metabolic acidosis with a CO2 of 21, anion gap of 11, chloride level of 110 ?-Was given IV fluid hydration with normal saline at 75 MLS per hour for 12 hours and this is now stopped ?-Repeat CMP in the a.m. ? ?Paroxysmal atrial fibrillation ([place]) ?-Had daily epistaxis on Eliquis and was switched to Xarelto several months ago d/t this but elected to not take it  ?-Continue to monitor on telemetry ?-Resume home metoprolol succinate 25 mg p.o. nightly ?-Pt willing to consider resuming anticoagulation, timing will based on MRI findings which is still pending to be done ?  ? ?Essential hypertension ?-Permit HTN to [id number] systolic in acute-phase ischemic CVA   ?-Continue to Monitor BP per Protocol ?-Last blood pressure reading was 139/77  ?-Upon his MAR review he does not appear to be taking any antihypertensives except metoprolol succinate 25 mg p.o. nightly which we will resume ? ?Diabetes mellitus type II, non insulin dependent  (HCC) ?-A1c was 6.8% in March 2022   ?-Repeat hemoglobin A1c here is now 6.6 ?-We will continue to monitor CBGs, use low-intensity SSI for now and adjust insulin regimen as necessary ?-Hold his home medications with glipizide 5 mg p.o. with breakfast and metformin 1000 mg p.o. twice daily ? ?CAD (coronary artery disease) ?- Is noted to be taking aspirin 81 mg chewable tablet daily at home and a statin of rosuvastatin 20 mg p.o. nightly but will increase his statin to 40 mg p.o. daily ?-We will resume his nitroglycerin 0.4 mg sublingually as needed ?-Continue metoprolol succinate 25 mg p.o. nightly ? ?DVT prophylaxis:  ?Rivaroxaban (XARELTO) tablet 15 mg  ?  Code Status: Full Code ?Family Communication: Discussed with wife at Bedside  ? ?Disposition Plan:  ?Level of care: Telemetry Medical ?Status is: Observation ?The patient remains OBS appropriate and will d/c before 2 midnights. ?  ?Consultants:  ?Neurology ? ?Procedures:  ?MRI ?ECHO ?EEG ? ?Antimicrobials:  ?Anti-infectives (From admission, onward)  ? ? None  ? ?  ?  ?Subjective: ?Seen and examined at bedside after he came back from his echo and states that he is feeling better.  Hopeful to go home.  Thinks his speech is improved but not back to his baseline.  No nausea or vomiting.  Denies any lightheadedness or dizziness.  Was intermittently confused with physical therapy but  appropriate on my examination.  Neurology recommending checking EEG given possible focal seizures because his MRI did not show any evidence of acute CVA.  No other concerns or complaints at this time. ? ?Objective: ?Vitals:  ? 02/08/22 0413 02/08/22 0825 02/08/22 1223 02/08/22 1643  ?BP: (!) 159/89 139/77 134/63 (!) 158/95  ?Pulse: 68 86 68 70  ?Resp: 17 20 20 14   ?Temp: 98.6 ?F (37 ?C) 97.9 ?F (36.6 ?C) 98 ?F (36.7 ?C) 97.8 ?F (36.6 ?C)  ?TempSrc:  Oral Oral Oral  ?SpO2: 100% 100% 98% 99%  ?Weight: 95.3 kg     ?Height: 5\' 11"  (1.803 m)     ? ? ?Intake/Output Summary (Last 24 hours) at  02/08/2022 1652 ?Last data filed at 02/08/2022 1307 ?Gross per 24 hour  ?Intake 1158.75 ml  ?Output 250 ml  ?Net 908.75 ml  ? ?Filed Weights  ? 02/07/22 1800 02/08/22 0413  ?Weight: 92.6 kg 95.3 kg  ? ?Examination: ?Phys

## 2022-02-08 NOTE — Evaluation (Signed)
Physical Therapy Evaluation ?Patient Details ?Name: Shane Welch ?MRN: [id number] ?DOB: 1941-10-12 ?Today's Date: 02/08/2022 ? ?History of Present Illness ? 81 y.o. male who presented with acute-onset speech difficulty. Stroke workup negative for acute findings. Pt with medical history significant for hypertension, hyperlipidemia, type 2 diabetes mellitus, CAD, history of CVA, and paroxysmal atrial fibrillation no longer anticoagulated ?  ?Clinical Impression ? Pt admitted with above. Pt with noted impaired short term memory, impaired processing and sequencing, confused requiring verbal cues for date and place. Pt's spouse reports "Its like his confusion comes and goes." Spouse and OT reports pt to have been oriented and able to follow all commands on their assessment however pt unable with PT during PT assessment. Pt unable to navigate hallway back to room or able to remember room number despite max verbal cues. Pt with noted decreased insight to deficits and safety. Acute PT to cont to follow.   ?   ? ?Recommendations for follow up therapy are one component of a multi-disciplinary discharge planning process, led by the attending physician.  Recommendations may be updated based on patient status, additional functional criteria and insurance authorization. ? ?Follow Up Recommendations Home health PT ? ?  ?Assistance Recommended at Discharge Frequent or constant Supervision/Assistance  ?Patient can return home with the following ? Direct supervision/assist for medications management;Direct supervision/assist for financial management;Assistance with cooking/housework;A little help with walking and/or transfers;A little help with bathing/dressing/bathroom ? ?  ?Equipment Recommendations None recommended by PT  ?Recommendations for Other Services ?    ?  ?Functional Status Assessment Patient has had a recent decline in their functional status and demonstrates the ability to make significant improvements in function in a  reasonable and predictable amount of time.  ? ?  ?Precautions / Restrictions Precautions ?Precautions: Fall ?Restrictions ?Weight Bearing Restrictions: No  ? ?  ? ?Mobility ? Bed Mobility ?Overal bed mobility: Needs Assistance ?Bed Mobility: Supine to Sit ?  ?  ?Supine to sit: Min guard ?  ?  ?General bed mobility comments: min guard for safety and max verbal cues as pt was trying to exit opposite side of bed with the bedrail up (L side), verbal cues required for pt to exit side of bed with bed rail down (the R side). no physical assist needed ?  ? ?Transfers ?Overall transfer level: Needs assistance ?Equipment used: None ?Transfers: Sit to/from Stand ?Sit to Stand: Min guard ?  ?  ?  ?  ?  ?General transfer comment: min guard for safety ?  ? ?Ambulation/Gait ?Ambulation/Gait assistance: Min guard ?Gait Distance (Feet): 200 Feet ?Assistive device: None ?Gait Pattern/deviations: Step-through pattern, Decreased stride length ?Gait velocity: dec ?Gait velocity interpretation: 1.31 - 2.62 ft/sec, indicative of limited community ambulator ?  ?General Gait Details: pt initially mildly unsteady however improved. Pt however unable to navigate hallways and find his way back to his room despite max verbal directional cues ? ?Stairs ?  ?  ?  ?  ?  ? ?Wheelchair Mobility ?  ? ?Modified Rankin (Stroke Patients Only) ?Modified Rankin (Stroke Patients Only) ?Pre-Morbid Rankin Score: Moderate disability ?Modified Rankin: Moderate disability ? ?  ? ?Balance Overall balance assessment: Needs assistance ?Sitting-balance support: Feet supported ?Sitting balance-Leahy Scale: Good ?  ?  ?Standing balance support: No upper extremity supported, During functional activity ?Standing balance-Leahy Scale: Fair ?  ?  ?  ?  ?  ?  ?  ?  ?  ?  ?  ?  ?   ? ? ? ?  Pertinent Vitals/Pain Pain Assessment ?Pain Assessment: No/denies pain  ? ? ?Home Living Family/patient expects to be discharged to:: Private residence ?Living Arrangements:  Spouse/significant other ?Available Help at Discharge: Family;Available 24 hours/day ?Type of Home: House ?Home Access: Stairs to enter ?Entrance Stairs-Rails: Can reach both ?Entrance Stairs-Number of Steps: 3-4 ?  ?Home Layout:  (1 step in the home) ?Home Equipment: Conservation officer, nature (2 wheels);Cane - single point;Grab bars - tub/shower;Shower seat - built in ?   ?  ?Prior Function Prior Level of Function : Independent/Modified Independent;History of Falls (last six months) ?  ?  ?  ?  ?  ?  ?Mobility Comments: uses SPC for community mobility intermittently ?ADLs Comments: indep ?  ? ? ?Hand Dominance  ? Dominant Hand: Right ? ?  ?Extremity/Trunk Assessment  ? Upper Extremity Assessment ?Upper Extremity Assessment: Overall WFL for tasks assessed ?  ? ?Lower Extremity Assessment ?Lower Extremity Assessment: Generalized weakness ?  ? ?Cervical / Trunk Assessment ?Cervical / Trunk Assessment: Normal  ?Communication  ? Communication: Expressive difficulties (some word finding difficulties noted - wife and pt state this is baseline)  ?Cognition Arousal/Alertness: Awake/alert ?Behavior During Therapy: [facility] for tasks assessed/performed ?Overall Cognitive Status: History of cognitive impairments - at baseline ?  ?  ?  ?  ?  ?  ?  ?  ?  ?  ?  ?  ?  ?  ?  ?  ?General Comments: pt's wife reports short term memory impairments at baseline. Per pt's wife he recently stopped taking all of his medication, including his memory meds. Pt requiring verbal cues for date and place. Pt with noted word finding difficulty, unable to navigate hallways back to room. Pt told his room number 5 times in 4 minutes and was unable to recall or find the room without max verbal cues ?  ?  ? ?  ?General Comments General comments (skin integrity, edema, etc.): VSS ? ?  ?Exercises    ? ?Assessment/Plan  ?  ?PT Assessment Patient needs continued PT services  ?PT Problem List Decreased strength;Decreased range of motion;Decreased activity  tolerance;Decreased balance;Decreased mobility ? ?   ?  ?PT Treatment Interventions DME instruction;Gait training;Stair training;Functional mobility training;Therapeutic activities;Therapeutic exercise;Balance training   ? ?PT Goals (Current goals can be found in the Care Plan section)  ?Acute Rehab PT Goals ?Patient Stated Goal: home ?PT Goal Formulation: With patient/family ?Time For Goal Achievement: 02/22/22 ?Potential to Achieve Goals: Good ?Additional Goals ?Additional Goal #1: Pt to score >19 on DGI to indicate minimal falls risk. ? ?  ?Frequency Min 3X/week ?  ? ? ?Co-evaluation   ?  ?  ?  ?  ? ? ?  ?AM-PAC PT "6 Clicks" Mobility  ?Outcome Measure Help needed turning from your back to your side while in a flat bed without using bedrails?: A Little ?Help needed moving from lying on your back to sitting on the side of a flat bed without using bedrails?: A Little ?Help needed moving to and from a bed to a chair (including a wheelchair)?: A Little ?Help needed standing up from a chair using your arms (e.g., wheelchair or bedside chair)?: A Little ?Help needed to walk in hospital room?: A Little ?Help needed climbing 3-5 steps with a railing? : A Little ?6 Click Score: 18 ? ?  ?End of Session Equipment Utilized During Treatment: Gait belt ?Activity Tolerance: Patient tolerated treatment well ?Patient left: in bed;with call bell/phone within reach;with bed alarm set;with family/visitor present ?Nurse  Communication: Mobility status ?PT Visit Diagnosis: Unsteadiness on feet (R26.81) ?  ? ?Time: [id number] ?PT Time Calculation (min) (ACUTE ONLY): 21 min ? ? ?Charges:   PT Evaluation ?$PT Eval Moderate Complexity: 1 Mod ?  ?  ?   ? ? ?Kittie Plater, PT, DPT ?Acute Rehabilitation Services ?Secure chat preferred ?Office #: [phone] ? ? ?Henry Demeritt M Mayo Owczarzak ?02/08/2022, 2:16 PM ? ?

## 2022-02-08 NOTE — Assessment & Plan Note (Addendum)
-  Lipid panel as delineated in acute CVA ?-Continue with statin but increase the rosuvastatin from 20 mg to 40 mg daily ?

## 2022-02-08 NOTE — Discharge Summary (Signed)
?Physician Discharge Summary ?  ?Patient: Shane Welch MRN: [id number] DOB: 1941/01/21  ?Admit date:     02/07/2022  ?Discharge date: 02/09/22  ?Discharge Physician: Raiford Noble, DO  ? ?PCP: Leonard Downing, MD  ? ?Recommendations at discharge:  ? ?Follow-up with PCP within 1 to 2 weeks and repeat CBC, CMP, mag, Phos within 1 week ?Follow-up with cardiology in outpatient setting for his reduced EF and mild global hypokinesia; currently not in acute exacerbation of CHF; resume home cardiac meds ?Follow-up with neurology within 2 months and he has appointment scheduled in June ?Ensure compliance with anticoagulation ? ?Discharge Diagnoses: ?Principal Problem: ?  TIA (transient ischemic attack) ?Active Problems: ?  CAD (coronary artery disease) ?  Diabetes mellitus type II, non insulin dependent ([place]) ?  Essential hypertension ?  PVC's (premature ventricular contractions) ?  Paroxysmal atrial fibrillation (HCC) ?  Stage 3b chronic kidney disease (CKD) ([place]) ?  HLD (hyperlipidemia) ?  Hypomagnesemia ? ?Resolved Problems: ?  * No resolved hospital problems. * ? ?Hospital Course: ?The patient is an 81 year old overweight male with a past medical history significant for but not due to hypertension, hyperlipidemia, diabetes mellitus type 2, history of CAD, history of CVA, history of PAF no longer on anticoagulation due to nosebleeds as well as other comorbidities who presented to the emergency department for the evaluation of acute onset speech difficulty.  He was in his usual state of health and had an unintelligible day when he developed speech difficulty at 2 PM.  He described having difficulty finding the right words to use and family can see he was struggling to communicate.  He denies any changes in vision, or hearing and denies any focal numbness or weakness, headache or palpitations.  Symptoms have since improved in the ED and he felt close to baseline at this point.  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  He was switched to Xarelto but elected to stop taking anticoagulants altogether.  Family also notes that he also stopped taking all of his other medications except for diabetes medications.  Upon arrival to the ED patient was found to be afebrile and saturating well on room air with an elevated blood pressure.  EKG showed sinus rhythm with frequent PVCs and LAFB.  Head CT was negative for any acute intracranial abnormalities.  Chemistry panel noted a creatinine of 1.8 and neurology was consulted and he was admitted for stroke work-up.  Currently neurology is recommending frequent neurochecks and monitor on telemetry with an MRI of the brain without contrast given that he has a implantable recorder which is MRI safe.  They also recommending a 2D echocardiogram, A1c, lipid panel and initiating aspirin 325 for now and depending on what the MRI shows there recommending that the timing of anticoagulation resumption to be determined by the stroke team.  PT OT has been consulted for further evaluation and they are recommending allowing for permissive hypertension. ? ?PT and OT recommending home health.  MRI did not show any evidence of acute CVA so EEG was ordered given that the patient could have had a focal seizure but EEG showed no seizure activity..  Neurology recommended that he may need to go back on his dementia medications but patient has refused Aricept and neurology recommends considering Namenda as an outpatient.  Patient was  counseled about compliance with medications and was discharged in stable condition only to follow-up with his PCP, cardiology as well as neurology in outpatient setting. ? ?Assessment and Plan: ?* TIA (transient ischemic attack) ?-Pt with PAF no longer taking anticoagulation p/w -He had acute-onset aphasia  ?-No acute finding on  head CT ?-Appreciate neurology consultation, ?-Plan to start ASA 325 for now but will be placed back on [facility] with Xarelto ?-Check TTE done and showed "Left ventricular ejection fraction, by estimation, is 40 to 45%. The  left ventricle has mildly decreased function. The left ventricle  demonstrates global hypokinesis. There is mild left ventricular  hypertrophy. Left ventricular diastolic function  could not be evaluated. Right ventricular systolic function is normal. The right ventricular size is normal. Tricuspid regurgitation signal is inadequate for assessing PA pressure.  Left atrial size was moderately dilated. The mitral valve is abnormal. Trivial mitral valve regurgitation. The aortic valve is tricuspid. Aortic valve regurgitation is not  visualized. Aortic dilatation noted. There is borderline dilatation of the aortic root, measuring 42 mm. The inferior vena cava is normal in size with greater than 50%  respiratory variability, suggesting right atrial pressure of 3 mmHg.   Rhythm strip during this exam demostrated atrial fibrillation."  ?-Checked Lipid panel and showed a total cholesterol/HDL ratio of 6.4, cholesterol level 180, HDL 28, LDL 119, triglycerides 165, VLDL 33 ?-Hemoglobin A1c was 6.6 ?-Permit HTN, consult PT/OT/SLP and they are recommending Home Health  ?-Continue neuro checks per protocol ,  ?-Kept NPO pending swallow eval but he tolerated bedside swallow studies placed on a heart healthy diet, ?-Resume anticoagulation with timing based on MRI findings  per neurology recommendations however the MRI showed "No acute finding. Generalized brain atrophy. Remote right occipital cortex infarct." ?-Neurology is now also recommending checking EEG which is done and showed no evidence of seizures but did show suggestive of mild diffuse encephalopathy which is nonspecific in etiology ?-PT/OT/SLP recommending home health ?  ? ?Hypomagnesemia ?- Patient had a mag level 1.4 and improved to 1.8 ?-Continue  monitor and replete as necessary ?-Repeat magnesium level within 1 week  ? ?HLD (hyperlipidemia) ?-Lipid panel as delineated in acute CVA ?-Continue with statin but increase the rosuvastatin from 20 mg to 40 mg daily ? ?Stage 3b chronic kidney disease (CKD) ([place]) ?Metabolic Acidosis ?-SCr is 1.84 on admission, previously in 1.5-1.8 range  ?-Avoid nephrotoxic medications, contrast dyes, hypotension and dehydration and make sure patient has adequate renal perfusion and renally adjust medications ?-Patient's BUN/creatinine has gone from 20/1.84 -> 26/1.90 -> 22/1.76 -> 19/1.62 ?-The patient has a slight metabolic acidosis with a CO2 of 21, anion gap of 11, chloride level of 110 ?-Was given IV fluid hydration with normal saline at 75 MLS per hour for 12 hours and this is now stopped ?-Repeat CMP in the a.m. ? ?Paroxysmal atrial fibrillation ([place]) ?-Had daily epistaxis on Eliquis and was switched to Xarelto several months ago d/t this but elected to not take it  ?-Continue to monitor on telemetry ?-Resume home metoprolol succinate 25 mg p.o. nightly ?-Pt willing to consider resuming anticoagulation, timing will based on MRI findings and he has been resumed on Xarelto by Neurology and will go on Xarelto 15 mg po qHS and titrate up to 20 mg Xarelto in the outpatient setting once CrCl is >50 ?  ? ?PVC's (premature ventricular contractions) ?-Resume Melexitine at D/c ? ?Essential hypertension ?-Permit HTN to [id number] systolic in acute-phase ischemic CVA   ?-Continue to Monitor  BP per Protocol ?-Last blood pressure reading was 139/77  ?-Upon his MAR review he does not appear to be taking any antihypertensives except metoprolol succinate 25 mg p.o. nightly which we will resume at D/C  ? ?Diabetes mellitus type II, non insulin dependent (HCC) ?-A1c was 6.8% in March 2022   ?-Repeat hemoglobin A1c here is now 6.6 ?-We will continue to monitor CBGs, use low-intensity SSI for now and adjust insulin regimen as necessary ?-Hold his home  medications with glipizide 5 mg p.o. with breakfast and metformin 1000 mg p.o. twice daily and resume at D/C ? ?CAD (coronary artery disease) ?-Is noted to be taking aspirin 81 mg chewable tablet daily at

## 2022-02-08 NOTE — Progress Notes (Addendum)
STROKE TEAM PROGRESS NOTE  ? ?INTERVAL HISTORY ?His wife is at the bedside.  Well-known patient to [place].  Wife states that he stopped all of his medications a couple months ago due to feeling poorly.  He had recently had his Aricept dose increased around the time that he stopped taking all of his medications.  He recently had his Eliquis switched to Xarelto due to nosebleeds.  He most recently saw cardiology in January 2023..  Follow-up outpatient to reassess medications for memory.  He is willing to resume his Xarelto and Crestor.  He presented with fluctuating symptoms of speech and word finding difficulties and confusion which are very transient.  MRI scan of the brain is negative for acute stroke.  CT angiogram shows right P2/P3 stenosis.  Urine drug screen is negative.  EEG is pending ? ?Vitals:  ? 02/07/22 2103 02/07/22 2324 02/08/22 0413 02/08/22 0825  ?BP: (!) 192/87 (!) 159/102 (!) 159/89 139/77  ?Pulse: 75 72 68 86  ?Resp: 18 16 17 20   ?Temp: 97.6 ?F (36.4 ?C) 98.8 ?F (37.1 ?C) 98.6 ?F (37 ?C) 97.9 ?F (36.6 ?C)  ?TempSrc: Oral   Oral  ?SpO2: 100% 97% 100% 100%  ?Weight:   95.3 kg   ?Height:   5\' 11"  (1.803 m)   ? ?CBC:  ?Recent Labs  ?Lab 02/07/22 ?1744 02/07/22 ?1831 02/08/22 ?0144  ?WBC 10.3  --  10.0  ?NEUTROABS 5.8  --   --   ?HGB 15.2 16.0 13.2  ?HCT 46.5 47.0 40.0  ?MCV 90.6  --  89.3  ?PLT 221  --  185  ? ?Basic Metabolic Panel:  ?Recent Labs  ?Lab 02/07/22 ?1744 02/07/22 ?1831  ?NA 140 141  ?K 4.4 4.3  ?CL 104 102  ?CO2 26  --   ?GLUCOSE 151* 148*  ?BUN 20 26*  ?CREATININE 1.84* 1.90*  ?CALCIUM 8.9  --   ? ?Lipid Panel:  ?Recent Labs  ?Lab 02/08/22 ?0144  ?CHOL 180  ?TRIG 165*  ?HDL 28*  ?CHOLHDL 6.4  ?VLDL 33  ?LDLCALC 119*  ? ?HgbA1c:  ?Recent Labs  ?Lab 02/08/22 ?0144  ?HGBA1C 6.6*  ? ?Urine Drug Screen:  ?Recent Labs  ?Lab 02/08/22 ?0540  ?LABOPIA NONE DETECTED  ?COCAINSCRNUR NONE DETECTED  ?LABBENZ NONE DETECTED  ?AMPHETMU NONE DETECTED  ?THCU NONE DETECTED  ?LABBARB NONE DETECTED  ?  ?Alcohol  Level  ?Recent Labs  ?Lab 02/07/22 ?[id number]  ?ETH <10  ? ? ?IMAGING past 24 hours ?CT HEAD WO CONTRAST (5MM) ? ?Result Date: 02/07/2022 ?CLINICAL DATA:  Stroke suspected, aphasia EXAM: CT HEAD WITHOUT CONTRAST TECHNIQUE: Contiguous axial images were obtained from the base of the skull through the vertex without intravenous contrast. RADIATION DOSE REDUCTION: This exam was performed according to the departmental dose-optimization program which includes automated exposure control, adjustment of the mA and/or kV according to patient size and/or use of iterative reconstruction technique. COMPARISON:  01/07/2021 FINDINGS: Brain: No evidence of acute infarction, hemorrhage, cerebral edema, mass, mass effect, or midline shift. No hydrocephalus or extra-axial fluid collection. Age-related cerebral atrophy. Periventricular white matter changes, likely the sequela of chronic small vessel ischemic disease. Vascular: No hyperdense vessel or unexpected calcification. Skull: Normal. Negative for fracture or focal lesion. Sinuses/Orbits: No acute finding. Status post bilateral lens replacements. Other: The mastoid air cells are well aerated. ASPECTS [facility] Stroke Program Early CT Score) - Ganglionic level infarction (caudate, lentiform nuclei, internal capsule, insula, M1-M3 cortex): 7 - Supraganglionic infarction (M4-M6 cortex): 3 Total score (0-10  with 10 being normal): 10 IMPRESSION: IMPRESSION No acute intracranial process. No evidence of acute infarct or hemorrhage. Code stroke imaging results were communicated on 02/07/2022 at 6:53 pm to provider Dr. Rory Percy via secure text paging. Electronically Signed   By: Merilyn Baba M.D.   On: 02/07/2022 18:53  ? ?CT ANGIO HEAD NECK W WO CM (CODE STROKE) ? ?Result Date: 02/07/2022 ?CLINICAL DATA:  Neuro deficit, acute, stroke suspected. Additional history provided: Aphasia, altered mental status for 2 hours. EXAM: CT ANGIOGRAPHY HEAD AND NECK TECHNIQUE: Multidetector CT imaging of the head  and neck was performed using the standard protocol during bolus administration of intravenous contrast. Multiplanar CT image reconstructions and MIPs were obtained to evaluate the vascular anatomy. Carotid stenosis measurements (when applicable) are obtained utilizing NASCET criteria, using the distal internal carotid diameter as the denominator. RADIATION DOSE REDUCTION: This exam was performed according to the departmental dose-optimization program which includes automated exposure control, adjustment of the mA and/or kV according to patient size and/or use of iterative reconstruction technique. CONTRAST:  61mL OMNIPAQUE IOHEXOL 350 MG/ML SOLN COMPARISON:  Noncontrast head CT performed earlier today 02/07/2022. MRA of the head and neck 01/08/2021. FINDINGS: CTA NECK FINDINGS Aortic arch: Standard aortic branching. Atherosclerotic plaque within the visualized aortic arch and proximal major branch vessels of the neck. No hemodynamically significant innominate or proximal subclavian artery stenosis. Right carotid system: CCA and ICA patent within the neck without stenosis. Mild atherosclerotic plaque about the carotid bifurcation and within the proximal ICA. Left carotid system: CCA and ICA patent within the neck without stenosis. Mild soft plaque scattered within the CCA. Vertebral arteries: Vertebral artery is developmentally diminutive, but patent within the neck. Calcified plaque at the origin of the right vertebral artery with no more than mild stenosis. Skeleton: Cervical spondylosis. No acute bony abnormality or aggressive osseous lesion. Other neck: Lipoma within the right cervical paraspinal musculature. No cervical lymphadenopathy Upper chest: No consolidation within the imaged lung apices. Paraseptal emphysema. Review of the MIP images confirms the above findings CTA HEAD FINDINGS Anterior circulation: The intracranial internal carotid arteries are patent. The M1 middle cerebral arteries are patent. No M2  proximal branch occlusion or high-grade proximal stenosis is identified. The anterior cerebral arteries are patent. No intracranial aneurysm is identified. Posterior circulation: The vertebral and basilar arteries are developmentally diminutive, but patent. Predominantly fetal origin bilateral posterior cerebral arteries. The posterior cerebral arteries are patent. Atherosclerotic irregularity of both vessels. Most notably, there are redemonstrated sites of severe stenosis within the P2 and P3 right PCA. Venous sinuses: Within the limitations of contrast timing, no convincing thrombus. Anatomic variants: As described Review of the MIP images confirms the above findings No emergent large vessel occlusion identified. These results were communicated to [facility] at 7:40 pmon 4/21/2023by text page via the [facility] messaging system. IMPRESSION: CTA neck: 1. The common carotid and internal carotid arteries are patent within the neck without stenosis. Mild atherosclerotic plaque, bilaterally, as described. 2. Vertebral artery is developmentally diminutive, but patent within the neck. Calcified plaque at the origin of the right vertebral artery with no more than mild stenosis. CTA head: 1. No intracranial large vessel occlusion is identified. 2. Redemonstrated sites of severe stenosis within the P2 and P3 right PCA. 3. Developmentally diminutive vertebral and basilar arteries with predominantly fetal origin bilateral posterior cerebral arteries. Electronically Signed   By: Kellie Simmering D.O.   On: 02/07/2022 19:42   ? ?PHYSICAL EXAM ? ?Physical Exam  ?Constitutional: Appears well-developed and  well-nourished.  Pleasant elderly Caucasian male not in distress. ?Cardiovascular: Normal rate and regular rhythm.  ?Respiratory: Effort normal, non-labored breathing ? ?Neuro: ?Mental Status: ?Patient is awake, alert, oriented to person, place, month, and year ?Speech is clear, difficulty with long sentences and with fluency.  Poor  recall of events ?No signs of aphasia or neglect.  Diminished attention, registration and recall.  Poor insight into his condition.  Easily distractible. ?Cranial Nerves: ?II: Visual Fields are full. Pup

## 2022-02-08 NOTE — Progress Notes (Signed)
OT Cancellation Note ? ?Patient Details ?Name: Shane Welch ?MRN: [id number] ?DOB: 09-18-1941 ? ? ?Cancelled Treatment:    Reason Eval/Treat Not Completed: Patient at procedure or test/ unavailable (Pt at MRI, OT evaluation to f/u as appropriate) ? ?[name] [name] ?02/08/2022, 8:26 AM ?

## 2022-02-08 NOTE — Progress Notes (Signed)
?  Echocardiogram ?2D Echocardiogram has been performed. ? ?Johny Chess ?02/08/2022, 11:48 AM ?

## 2022-02-08 NOTE — Evaluation (Signed)
Occupational Therapy Evaluation ?Patient Details ?Name: Shane Welch ?MRN: [id number] ?DOB: [birth year] ?Today's Date: 02/08/2022 ? ? ?History of Present Illness 81 y.o. male who presented with acute-onset speech difficulty. Stroke workup negative for acute findings. Pt with medical history significant for hypertension, hyperlipidemia, type 2 diabetes mellitus, CAD, history of CVA, and paroxysmal atrial fibrillation no longer anticoagulated  ? ?Clinical Impression ?  ?Shane Welch was evaluated s/p the above admission list, he is generally indep at baseline with intermittent use of SPC. He and his wife report his last fall was "a few months ago" but he has since stopped taking his prescribed medications, and needs assist with memory deficits "sometimes." Upon evaluation pt was fully oriented and followed all commands appropriately. He was noted to have some trouble recalling his home set up and past information. Overall he required supervision for sitting ADLs and min G for all OOB Adls and functional mobility without AD. Pt mildly unsteady with hallway ambulation. He does not have further acute OT needs as he is likely at his baseline. Recommend d/c to home with support of his wife.  ?   ? ?Recommendations for follow up therapy are one component of a multi-disciplinary discharge planning process, led by the attending physician.  Recommendations may be updated based on patient status, additional functional criteria and insurance authorization.  ? ?Follow Up Recommendations ? No OT follow up  ?  ?Assistance Recommended at Discharge Frequent or constant Supervision/Assistance  ?Patient can return home with the following A little help with walking and/or transfers;A little help with bathing/dressing/bathroom;Assistance with cooking/housework;Direct supervision/assist for medications management;Assist for transportation;Direct supervision/assist for financial management;Help with stairs or ramp for entrance ? ?  ?Functional Status  Assessment ? Patient has had a recent decline in their functional status and demonstrates the ability to make significant improvements in function in a reasonable and predictable amount of time.  ?Equipment Recommendations ? None recommended by OT  ?  ?Recommendations for Other Services   ? ? ?  ?Precautions / Restrictions Precautions ?Precautions: Fall ?Restrictions ?Weight Bearing Restrictions: No  ? ?  ? ?Mobility Bed Mobility ?Overal bed mobility: Independent ?  ?  ?  ?  ?  ?  ?  ?  ? ?Transfers ?Overall transfer level: Modified independent ?  ?  ?  ?  ?  ?  ?  ?  ?General transfer comment: supervision provided for safety this date ?  ? ?  ?Balance Overall balance assessment: Needs assistance ?Sitting-balance support: Feet supported ?Sitting balance-Leahy Scale: Good ?  ?  ?Standing balance support: No upper extremity supported, During functional activity ?Standing balance-Leahy Scale: Fair ?Standing balance comment: noted to be mildly unsteady with ambulation. no overt LOB. ?  ?  ?  ?  ?  ?  ?  ?  ?  ?  ?  ?   ? ?ADL either performed or assessed with clinical judgement  ? ?ADL Overall ADL's : Needs assistance/impaired ?Eating/Feeding: Independent;Sitting ?  ?Grooming: Supervision/safety;Standing ?  ?Upper Body Bathing: Supervision/ safety;Sitting ?  ?Lower Body Bathing: Min guard;Sit to/from stand ?  ?Upper Body Dressing : Supervision/safety;Sitting ?  ?Lower Body Dressing: Min guard;Sit to/from stand ?  ?Toilet Transfer: Min guard;Ambulation ?  ?Toileting- Clothing Manipulation and Hygiene: Supervision/safety;Sitting/lateral lean ?  ?  ?  ?Functional mobility during ADLs: Min guard ?General ADL Comments: pt mildly unsteady with functional ambualtion and OOB tasks, needs min G for safety. Pt also presenting wtih memory deficits and needs supervision for general safety and  problem solving  ? ? ? ?Vision Baseline Vision/History: 1 Wears glasses ?Ability to See in Adequate Light: 0 Adequate ?Vision Assessment?: No  apparent visual deficits  ?   ?Perception   ?  ?Praxis   ?  ? ?Pertinent Vitals/Pain Pain Assessment ?Pain Assessment: No/denies pain  ? ? ? ?Hand Dominance Right ?  ?Extremity/Trunk Assessment Upper Extremity Assessment ?Upper Extremity Assessment: Overall WFL for tasks assessed ?  ?Lower Extremity Assessment ?Lower Extremity Assessment: Defer to PT evaluation ?  ?Cervical / Trunk Assessment ?Cervical / Trunk Assessment: Normal ?  ?Communication Communication ?Communication: No difficulties;Expressive difficulties (some word finding difficulties noted - wife and pt state this is baseline) ?  ?Cognition Arousal/Alertness: Awake/alert ?Behavior During Therapy: [facility] for tasks assessed/performed ?Overall Cognitive Status: History of cognitive impairments - at baseline ?  ?  ?  ?  ?  ?  ?  ?  ?  ?  ?  ?  ?  ?  ?  ?  ?General Comments: Pt, pt's wife and MD report memory impairments at baseline. Per pt's wife he recently stopped taking all of his medication, including his memory meds. Overall pt was A&Ox4. Difficulty recalling home set up, and seemed to have some word finding difficulties as if he forgets what he is trying to say. ?  ?  ?General Comments  VSS onRA. wife present and supportive. ? ?  ?Exercises   ?  ?Shoulder Instructions    ? ? ?Home Living Family/patient expects to be discharged to:: Private residence ?Living Arrangements: Spouse/significant other ?Available Help at Discharge: Family;Available 24 hours/day ?Type of Home: House ?Home Access: Stairs to enter ?Entrance Stairs-Number of Steps: 3-4 ?Entrance Stairs-Rails: Can reach both ?Home Layout:  (1 step in the home) ?  ?  ?Bathroom Shower/Tub: Tub/shower unit;Walk-in shower ?  ?Bathroom Toilet: Handicapped height ?Bathroom Accessibility: Yes ?  ?Home Equipment: Conservation officer, nature (2 wheels);Cane - single point;Grab bars - tub/shower;Shower seat - built in ?  ?  ?  ? ?  ?Prior Functioning/Environment Prior Level of Function : Independent/Modified  Independent;History of Falls (last six months) ?  ?  ?  ?  ?  ?  ?Mobility Comments: uses SPC for community mobility intermittently ?ADLs Comments: indep ?  ? ?  ?  ?OT Problem List: Decreased strength;Decreased range of motion;Decreased activity tolerance;Impaired balance (sitting and/or standing);Decreased cognition;Decreased safety awareness;Decreased knowledge of precautions ?  ?   ?OT Treatment/Interventions:    ?  ?OT Goals(Current goals can be found in the care plan section) Acute Rehab OT Goals ?Patient Stated Goal: home today ?OT Goal Formulation: All assessment and education complete, DC therapy ?Time For Goal Achievement: 02/08/22 ?Potential to Achieve Goals: Good  ? ?AM-PAC OT "6 Clicks" Daily Activity     ?Outcome Measure Help from another person eating meals?: None ?Help from another person taking care of personal grooming?: A Little ?Help from another person toileting, which includes using toliet, bedpan, or urinal?: A Little ?Help from another person bathing (including washing, rinsing, drying)?: A Little ?Help from another person to put on and taking off regular upper body clothing?: A Little ?Help from another person to put on and taking off regular lower body clothing?: A Little ?6 Click Score: 19 ?  ?End of Session Equipment Utilized During Treatment: Gait belt ?Nurse Communication: Mobility status ? ?Activity Tolerance: Patient tolerated treatment well ?Patient left: in chair;with call bell/phone within reach;with chair alarm set;with family/visitor present ? ?OT Visit Diagnosis: Unsteadiness on feet (R26.81);Other abnormalities of  gait and mobility (R26.89);History of falling (Z91.81);Muscle weakness (generalized) (M62.81)  ?              ?Time: [id number] ?OT Time Calculation (min): 16 min ?Charges:  OT General Charges ?$OT Visit: 1 Visit ?OT Evaluation ?$OT Eval Moderate Complexity: 1 Mod ? ? ?Rafael Salway A Kiyoshi Schaab ?02/08/2022, 11:22 AM ?

## 2022-02-08 NOTE — Assessment & Plan Note (Addendum)
-   Patient had a mag level 1.4 and improved to 1.8 ?-Continue monitor and replete as necessary ?-Repeat magnesium level within 1 week  ?

## 2022-02-08 NOTE — TOC Initial Note (Signed)
Transition of Care (TOC) - Initial/Assessment Note  ? ? ?Patient Details  ?Name: Shane Welch ?MRN: [id number] ?Date of Birth: 1941/04/24 ? ?Transition of Care (TOC) CM/SW Contact:    ?Lawerance Sabal, RN ?Phone Number: ?02/08/2022, 4:14 PM ? ?Clinical Narrative:         ?Spoke w patient and wife at bedside. Pt has echo and EEg pending.  ?For DC accepted by [facility] for [facility] services. Patient has RW and cane at home. No other DME expected. ?TOC will continue to follow         ? ? ?Expected Discharge Plan: Home w Home Health Services ?Barriers to Discharge: Continued Medical Work up ? ? ?Patient Goals and CMS Choice ?Patient states their goals for this hospitalization and ongoing recovery are:: to go home ?CMS Medicare.gov Compare Post Acute Care list provided to:: Other (Comment Required) ?Choice offered to / list presented to : Spouse ? ?Expected Discharge Plan and Services ?Expected Discharge Plan: Home w Home Health Services ?  ?Discharge Planning Services: CM Consult ?Post Acute Care Choice: Home Health ?Living arrangements for the past 2 months: Single Family Home ?                ?DME Arranged: N/A ?  ?  ?  ?  ?HH Arranged: PT ?HH Agency: [facility] ?Date HH Agency Contacted: 02/08/22 ?Time HH Agency Contacted: 1614 ?Representative spoke with at [facility] Agency: Kandee Keen ? ?Prior Living Arrangements/Services ?Living arrangements for the past 2 months: Single Family Home ?Lives with:: Spouse ?  ?       ?  ?  ?  ?  ? ?Activities of Daily Living ?Home Assistive Devices/Equipment: Cane (specify quad or straight) ?ADL Screening (condition at time of admission) ?Patient's cognitive ability adequate to safely complete daily activities?: Yes ?Is the patient deaf or have difficulty hearing?: No ?Does the patient have difficulty seeing, even when wearing glasses/contacts?: No ?Does the patient have difficulty concentrating, remembering, or making decisions?: Yes ?Patient able to express need for assistance with ADLs?:  Yes ?Does the patient have difficulty dressing or bathing?: No ?Independently performs ADLs?: Yes (appropriate for developmental age) ?Does the patient have difficulty walking or climbing stairs?: No ?Weakness of Legs: None ?Weakness of Arms/Hands: None ? ?Permission Sought/Granted ?  ?  ?   ?   ?   ?   ? ?Emotional Assessment ?  ?  ?  ?  ?  ?  ? ?Admission diagnosis:  Aphasia [R47.01] ?Acute ischemic stroke (HCC) [I63.9] ?Patient Active Problem List  ? Diagnosis Date Noted  ? HLD (hyperlipidemia) 02/08/2022  ? Hypomagnesemia 02/08/2022  ? Acute ischemic stroke (HCC) 02/07/2022  ? 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 Fatigue 12/03/2021  ? Stage 3b chronic kidney disease (CKD) (HCC) 10/01/2021  ? PVC's (premature ventricular contractions) 09/30/2021  ? Hypotension 09/30/2021  ? Paroxysmal atrial fibrillation (HCC) 09/30/2021  ? Memory loss 07/10/2021  ? Acute CVA (cerebrovascular accident)/Rt occipital Stroke  01/09/2021  ? Hypertensive emergency 01/08/2021  ? TIA (transient ischemic attack) 01/08/2021  ? Dilated aortic root (HCC) 09/06/2018  ? Essential hypertension 09/03/2017  ? 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 Abnormal echocardiogram 03/11/2016  ? CAD (coronary artery disease) 12/10/2015  ? Hyperlipidemia 12/10/2015  ? Diabetes mellitus type II, non insulin dependent (HCC) 12/10/2015  ? Acute MI anterior wall first episode care [facility])   ? ?PCP:  Kaleen Mask, MD ?Pharmacy:   ?[facility] [phone] - [place], [place] - [address] AT [address] ?[place] ?[place] [address] ?Phone: [phone] Fax: [phone] ? ? ? ? ?Social Determinants of Health (SDOH) Interventions ?  ? ?Readmission Risk Interventions ? ?  01/11/2021  ?  4:20 PM 01/11/2021  ?  2:20 PM  ?Readmission Risk Prevention Plan  ?Post Dischage Appt Complete   ?Medication Screening  Complete  ?Transportation Screening  Complete  ? ? ? ?

## 2022-02-08 NOTE — Plan of Care (Signed)
  Problem: Coping: Goal: Will verbalize positive feelings about self Outcome: Progressing Goal: Will identify appropriate support needs Outcome: Progressing   

## 2022-02-08 NOTE — Assessment & Plan Note (Addendum)
-  Pt with PAF no longer taking anticoagulation p/w -He had acute-onset aphasia  ?-No acute finding on head CT ?-Appreciate neurology consultation, ?-Plan to start ASA 325 for now but will be placed back on [facility] with Xarelto ?-Check TTE done and showed "Left ventricular ejection fraction, by estimation, is 40 to 45%. The  left ventricle has mildly decreased function. The left ventricle  demonstrates global hypokinesis. There is mild left ventricular  hypertrophy. Left ventricular diastolic function  could not be evaluated. Right ventricular systolic function is normal. The right ventricular size is normal. Tricuspid regurgitation signal is inadequate for assessing PA pressure.  Left atrial size was moderately dilated. The mitral valve is abnormal. Trivial mitral valve regurgitation. The aortic valve is tricuspid. Aortic valve regurgitation is not  visualized. Aortic dilatation noted. There is borderline dilatation of the aortic root, measuring 42 mm. The inferior vena cava is normal in size with greater than 50%  respiratory variability, suggesting right atrial pressure of 3 mmHg.   Rhythm strip during this exam demostrated atrial fibrillation."  ?-Checked Lipid panel and showed a total cholesterol/HDL ratio of 6.4, cholesterol level 180, HDL 28, LDL 119, triglycerides 165, VLDL 33 ?-Hemoglobin A1c was 6.6 ?-Permit HTN, consult PT/OT/SLP and they are recommending Home Health  ?-Continue neuro checks per protocol ,  ?-Kept NPO pending swallow eval but he tolerated bedside swallow studies placed on a heart healthy diet, ?-Resume anticoagulation with timing based on MRI findings  per neurology recommendations however the MRI showed "No acute finding. Generalized brain atrophy. Remote right occipital cortex infarct." ?-Neurology is now also recommending checking EEG which is done and showed no evidence of seizures but did show suggestive of mild diffuse encephalopathy which is nonspecific in etiology ?-PT/OT/SLP  recommending home health ?? ?

## 2022-02-08 NOTE — Care Management Obs Status (Signed)
MEDICARE OBSERVATION STATUS NOTIFICATION ? ? ?Patient Details  ?Name: Shane Welch ?MRN: [id number] ?Date of Birth: 01-06-1941 ? ? ?Medicare Observation Status Notification Given:  Yes ? ? ? ?Carles Collet, RN ?02/08/2022, 4:18 PM ?

## 2022-02-08 NOTE — Procedures (Signed)
Patient Name: Shane Welch  ?MRN: [id number]  ?Epilepsy Attending: Charlsie Quest  ?Referring Physician/Provider: Merlene Laughter, DO ?Date: 02/08/2022 ?Duration: 23.37 mins ? ?Patient history: 81 y.o. male with fluctuating symptoms of speech and word finding difficulties. EEG to evaluate for seizure ? ?Level of alertness: Awake ? ?AEDs during EEG study: None ? ?Technical aspects: This EEG study was done with scalp electrodes positioned according to the 10-20 International system of electrode placement. Electrical activity was acquired at a sampling rate of 500Hz  and reviewed with a high frequency filter of 70Hz  and a low frequency filter of 1Hz . EEG data were recorded continuously and digitally stored.  ? ?Description: The posterior dominant rhythm consists of 7 Hz activity of moderate voltage (25-35 uV) seen predominantly in posterior head regions, symmetric and reactive to eye opening and eye closing. EEG showed intermittent generalized 5 to 6 Hz theta slowing. Physiologic photic driving was not seen during photic stimulation.  Hyperventilation was not performed.    ? ?ABNORMALITY ?- Intermittent slow, generalized ? ?IMPRESSION: ?This study is suggestive of mild diffuse encephalopathy, nonspecific etiology. No seizures or epileptiform discharges were seen throughout the recording. ? ?  ? ?

## 2022-02-08 NOTE — Assessment & Plan Note (Addendum)
-  Had daily epistaxis on Eliquis and was switched to Xarelto several months ago d/t this but elected to not take it  ?-Continue to monitor on telemetry ?-Resume home metoprolol succinate 25 mg p.o. nightly ?-Pt willing to consider resuming anticoagulation, timing will based on MRI findings and he has been resumed on Xarelto by Neurology and will go on Xarelto 15 mg po qHS and titrate up to 20 mg Xarelto in the outpatient setting once CrCl is >50 ?? ?

## 2022-02-08 NOTE — Assessment & Plan Note (Addendum)
-  Is noted to be taking aspirin 81 mg chewable tablet daily at home and a statin of rosuvastatin 20 mg p.o. nightly but will increase his statin to 40 mg p.o. daily ?-We will resume his nitroglycerin 0.4 mg sublingually as needed ?-Continue metoprolol succinate 25 mg p.o. nightly ?-ECHO showed global hypokineses and EF of 40-45%; Will need to follow up with Primary Cardiology  ?

## 2022-02-08 NOTE — Progress Notes (Signed)
EEG complete - results pending 

## 2022-02-08 NOTE — Assessment & Plan Note (Addendum)
-  Permit HTN to 220 systolic in acute-phase ischemic CVA   ?-Continue to Monitor BP per Protocol ?-Last blood pressure reading was 139/77  ?-Upon his MAR review he does not appear to be taking any antihypertensives except metoprolol succinate 25 mg p.o. nightly which we will resume at D/C  ?

## 2022-02-09 DIAGNOSIS — I480 Paroxysmal atrial fibrillation: Secondary | ICD-10-CM | POA: Diagnosis not present

## 2022-02-09 DIAGNOSIS — I493 Ventricular premature depolarization: Secondary | ICD-10-CM

## 2022-02-09 DIAGNOSIS — G459 Transient cerebral ischemic attack, unspecified: Secondary | ICD-10-CM | POA: Diagnosis not present

## 2022-02-09 DIAGNOSIS — E119 Type 2 diabetes mellitus without complications: Secondary | ICD-10-CM | POA: Diagnosis not present

## 2022-02-09 DIAGNOSIS — I10 Essential (primary) hypertension: Secondary | ICD-10-CM | POA: Diagnosis not present

## 2022-02-09 LAB — CBC WITH DIFFERENTIAL/PLATELET
Abs Immature Granulocytes: 0.05 10*3/uL (ref 0.00–0.07)
Basophils Absolute: 0 10*3/uL (ref 0.0–0.1)
Basophils Relative: 0 %
Eosinophils Absolute: 0.2 10*3/uL (ref 0.0–0.5)
Eosinophils Relative: 2 %
HCT: 44.8 % (ref 39.0–52.0)
Hemoglobin: 14.8 g/dL (ref 13.0–17.0)
Immature Granulocytes: 1 %
Lymphocytes Relative: 29 %
Lymphs Abs: 2.7 10*3/uL (ref 0.7–4.0)
MCH: 29.6 pg (ref 26.0–34.0)
MCHC: 33 g/dL (ref 30.0–36.0)
MCV: 89.6 fL (ref 80.0–100.0)
Monocytes Absolute: 0.8 10*3/uL (ref 0.1–1.0)
Monocytes Relative: 8 %
Neutro Abs: 5.5 10*3/uL (ref 1.7–7.7)
Neutrophils Relative %: 60 %
Platelets: 197 10*3/uL (ref 150–400)
RBC: 5 MIL/uL (ref 4.22–5.81)
RDW: 13.9 % (ref 11.5–15.5)
WBC: 9.2 10*3/uL (ref 4.0–10.5)
nRBC: 0 % (ref 0.0–0.2)

## 2022-02-09 LAB — COMPREHENSIVE METABOLIC PANEL
ALT: 16 U/L (ref 0–44)
AST: 16 U/L (ref 15–41)
Albumin: 3.6 g/dL (ref 3.5–5.0)
Alkaline Phosphatase: 43 U/L (ref 38–126)
Anion gap: 7 (ref 5–15)
BUN: 19 mg/dL (ref 8–23)
CO2: 27 mmol/L (ref 22–32)
Calcium: 8.8 mg/dL — ABNORMAL LOW (ref 8.9–10.3)
Chloride: 107 mmol/L (ref 98–111)
Creatinine, Ser: 1.62 mg/dL — ABNORMAL HIGH (ref 0.61–1.24)
GFR, Estimated: 43 mL/min — ABNORMAL LOW (ref >60)
Glucose, Bld: 140 mg/dL — ABNORMAL HIGH (ref 70–99)
Potassium: 4 mmol/L (ref 3.5–5.1)
Sodium: 141 mmol/L (ref 135–145)
Total Bilirubin: 0.7 mg/dL (ref 0.3–1.2)
Total Protein: 6.6 g/dL (ref 6.5–8.1)

## 2022-02-09 LAB — MAGNESIUM: Magnesium: 1.8 mg/dL (ref 1.7–2.4)

## 2022-02-09 LAB — GLUCOSE, CAPILLARY
Glucose-Capillary: 149 mg/dL — ABNORMAL HIGH (ref 70–99)
Glucose-Capillary: 148 mg/dL — ABNORMAL HIGH (ref 70–99)

## 2022-02-09 LAB — PHOSPHORUS: Phosphorus: 3.2 mg/dL (ref 2.5–4.6)

## 2022-02-09 MED ORDER — ROSUVASTATIN CALCIUM 40 MG PO TABS: 40.0000 mg | ORAL_TABLET | Freq: Every day | ORAL | 0 refills | Status: DC

## 2022-02-09 MED ORDER — RIVAROXABAN 15 MG PO TABS: 15.0000 mg | ORAL_TABLET | Freq: Every day | ORAL | 0 refills | Status: DC

## 2022-02-09 MED ORDER — METOPROLOL SUCCINATE ER 25 MG PO TB24: 25.0000 mg | ORAL_TABLET | Freq: Every day | ORAL | 0 refills | Status: DC

## 2022-02-09 NOTE — Progress Notes (Signed)
STROKE TEAM PROGRESS NOTE  ? ?INTERVAL HISTORY ?His wife is at the bedside.   He has had fluctuating intermittent confusion and disorientation this morning appears to be back to his baseline.  He is sitting in a bedside chair.  He wants to go home.  He has no complaints.  EEG showed mild generalized slowing without epileptiform activity.  Echocardiogram showed ejection fraction of 40 to 45% with mild global hypokinesia.  This is slightly lower than previous EF of 50 to 55% on echo from March 2022.  No definite clot. ?Vitals:  ? 02/09/22 0004 02/09/22 0331 02/09/22 0815 02/09/22 1205  ?BP: (!) 159/68 (!) 120/93 (!) 158/95 126/77  ?Pulse: 76 79 82 (!) 43  ?Resp: 16 20 18 14   ?Temp: 98.3 ?F (36.8 ?C) 98.2 ?F (36.8 ?C) (!) 97.5 ?F (36.4 ?C) 97.8 ?F (36.6 ?C)  ?TempSrc: Oral Oral Oral Oral  ?SpO2: 97% 99% 100% 99%  ?Weight:      ?Height:      ? ?CBC:  ?Recent Labs  ?Lab 02/07/22 ?1744 02/07/22 ?1831 02/08/22 ?0144 02/09/22 ?0755  ?WBC 10.3  --  10.0 9.2  ?NEUTROABS 5.8  --   --  5.5  ?HGB 15.2   < > 13.2 14.8  ?HCT 46.5   < > 40.0 44.8  ?MCV 90.6  --  89.3 89.6  ?PLT 221  --  185 197  ? < > = values in this interval not displayed.  ? ?Basic Metabolic Panel:  ?Recent Labs  ?Lab 02/08/22 ?0144 02/09/22 ?[id number]  ?NA 142 141  ?K 4.1 4.0  ?CL 110 107  ?CO2 21* 27  ?GLUCOSE 80 140*  ?BUN 22 19  ?CREATININE 1.76* 1.62*  ?CALCIUM 8.2* 8.8*  ?MG 1.4* 1.8  ?PHOS 3.6 3.2  ? ?Lipid Panel:  ?Recent Labs  ?Lab 02/08/22 ?0144  ?CHOL 180  ?TRIG 165*  ?HDL 28*  ?CHOLHDL 6.4  ?VLDL 33  ?LDLCALC 119*  ? ?HgbA1c:  ?Recent Labs  ?Lab 02/08/22 ?0144  ?HGBA1C 6.6*  ? ?Urine Drug Screen:  ?Recent Labs  ?Lab 02/08/22 ?0540  ?LABOPIA NONE DETECTED  ?COCAINSCRNUR NONE DETECTED  ?LABBENZ NONE DETECTED  ?AMPHETMU NONE DETECTED  ?THCU NONE DETECTED  ?LABBARB NONE DETECTED  ?  ?Alcohol Level  ?Recent Labs  ?Lab 02/07/22 ?[id number]  ?ETH <10  ? ? ?IMAGING past 24 hours ?EEG adult ? ?Result Date: 02/08/2022 ?Lora Havens, MD     02/08/2022  6:32 PM Patient  Name: Bretley Mendillo MRN: [id number] Epilepsy Attending: Lora Havens Referring Physician/Provider: Kerney Elbe, DO Date: 02/08/2022 Duration: 23.37 mins Patient history: 81 y.o. male with fluctuating symptoms of speech and word finding difficulties. EEG to evaluate for seizure Level of alertness: Awake AEDs during EEG study: None Technical aspects: This EEG study was done with scalp electrodes positioned according to the 10-20 International system of electrode placement. Electrical activity was acquired at a sampling rate of 500Hz  and reviewed with a high frequency filter of 70Hz  and a low frequency filter of 1Hz . EEG data were recorded continuously and digitally stored. Description: The posterior dominant rhythm consists of 7 Hz activity of moderate voltage (25-35 uV) seen predominantly in posterior head regions, symmetric and reactive to eye opening and eye closing. EEG showed intermittent generalized 5 to 6 Hz theta slowing. Physiologic photic driving was not seen during photic stimulation.  Hyperventilation was not performed.   ABNORMALITY - Intermittent slow, generalized IMPRESSION: This study is suggestive of mild diffuse encephalopathy, nonspecific etiology. No  seizures or epileptiform discharges were seen throughout the recording. Priyanka Barbra Sarks   ? ?PHYSICAL EXAM ? ?Physical Exam  ?Constitutional: Appears well-developed and well-nourished.  Pleasant elderly Caucasian male not in distress. ?Cardiovascular: Normal rate and regular rhythm.  ?Respiratory: Effort normal, non-labored breathing ? ?Neuro: ?Mental Status: ?Patient is awake, alert, oriented to person, place, month, and year ?Speech is clear, difficulty with long sentences and with fluency.  Poor recall of events ?No signs of aphasia or neglect.  Diminished attention, registration and recall.  Poor insight into his condition.  Easily distractible. ?Cranial Nerves: ?II: Visual Fields are full. Pupils are equal, round, and reactive to light.    ?III,IV, VI: EOMI without ptosis or diploplia.  ?V: Facial sensation is symmetric to temperature ?VII: Facial movement is symmetric resting and smiling ?VIII: Hearing is intact to voice ?X: Palate elevates symmetrically ?XI: Shoulder shrug is symmetric. ?XII: Tongue protrudes midline without atrophy or fasciculations.  ?Motor: ?Tone is normal. Bulk is normal. 5/5 strength was present in all four extremities.  ?Sensory: ?Sensation is symmetric to light touch and temperature in the arms and legs. No extinction to DSS present.  ?Cerebellar: ?FNF and HKS are intact bilaterally ?Gait-slightly staggered initially but steady over time ? ? ? ? ?  ? ?ASSESSMENT/PLAN ?Mr. Jacobanthony Bilbo is a 81 y.o. 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  He is agreeable to resume his medications including Xarelto and Crestor. Resume Xarelto at 15 mg due to kidney function.  Increase Crestor To 40 mg, LDL 119. Plan to keep follow-up appointment with [facility] for June. ? ?Suspect left hemispheric TIA in the setting of paroxysmal atrial fibrillation, noncompliant with medication.  Patient has baseline mild dementia ?Code Stroke CT head No acute abnormality. ASPECTS 10.    ?CTA head & neck no LVO.  Right P2 severe stenosis versus occlusion.  That is likely chronic.  Bilateral fetal PCAs. ?MRI no acute finding, generalized brain atrophy.  Remote right occipital cortex infarct, chronic. ?2D Echo-left ventricular ejection fraction of 40 to 45% with mild global hypokinesis.  Left atrium moderately dilated. ?LDL 119 ?HgbA1c 6.6 ?VTE prophylaxis -Lovenox ?   ?Diet  ? Diet Heart Room service appropriate? Yes; Fluid consistency: Thin  ? ?No antithrombotic prior to admission, now on Xarelto (rivaroxaban) daily.   Noncompliant with Xarelto for the past 2 months.Pharmacy recommends decreasing Xarelto to 15 mg due to kidney function. ASA 325 d/c'd ?Therapy recommendations: No follow-up recommended ?Disposition: Home ? ?Atrial fibrillation ?Loop recorder in place- follows with Dr. Curt Bears ?Xarelto initiated due to nosebleeds on Eliquis ?PVCs noted on Linq monitor- started Mexiletine 250mg  BID 11/08/2021 ? ?Hypertension ?Home meds: Metoprolol succinate ?Stable ?Permissive hypertension (OK if < 220/120) but gradually normalize in 5-7 days ?Long-term BP goal normotensive ? ?Hyperlipidemia ?Home meds:  Crestor 20mg  ?Increased to 40 mg ?LDL 119, goal < 70 ?Continue statin at discharge ? ?Diabetes type II Controlled ?Home meds: Metformin, glipizide ?HgbA1c 6.6, goal < 7.0 ?CBGs ?SSI ? ?Other Stroke Risk Factors ?Advanced Age >/= 75  ?Hx stroke/TIA ?01/07/2021-  Found to have 2 punctate right occipital cortex infarcts and hypoplastic basilar arteries with bilateral fetal origin PCAs.  He was discharged on DAPT therapy and then Plavix alone ?Coronary artery disease ?Stents placed by Dr. Irish Lack ? ? ?Other Active Problems ?Dementia ?Follows outpatient  with [facility]- last seen 11/14/2021 ?Noncompliant with Aricept ?MMSE score 24/30 at the appt ?CKD stage IIIb ?Baseline kidney function appears to be 1.5-1.8 ?Cr 1.76- Continue to trend ?Hypomagnesemia ?Mag 1.4-replete with 2 g mag sulfate IV ? ?Hospital day # 0 ? ?Patient known to me from prior office visits for mild dementia and stroke.  Stopped all his medications several months ago.  He presents with fluctuating symptoms of speech , confusion and word finding difficulties possibly left hemispheric TIAs versus focal seizures.  MRI is negative for acute infarct.  EEG shows no seizure activity.  Patient probably needs to go back on his dementia medications but is reluctant to try Aricept.  May consider Namenda later as an outpatient on follow-up visit which is scheduled in June with me..  Patient  counseled to be compliant taking his medications and is agreeable.  Recommend resume Xarelto.  Long discussion with patient and wife and answered questions.  Stroke team will sign off.  Keep your schedul

## 2022-02-09 NOTE — Assessment & Plan Note (Signed)
-  Resume Melexitine at D/c ?

## 2022-02-09 NOTE — Progress Notes (Signed)
PT Cancellation Note ? ?Patient Details ?Name: Shane Welch ?MRN: [id number] ?DOB: November 28, 1940 ? ? ?Cancelled Treatment:    Reason Eval/Treat Not Completed: Patient declined, no reason specified. Pt reports he was up all night and declines ambulation at this time. PT will attempt to follow up at a later time if the pt remains admitted. ? ? ?Arlyss Gandy ?02/09/2022, 11:19 AM ?

## 2022-02-09 NOTE — Progress Notes (Addendum)
Patient AAOx3 but becoming agitated when he gets up to use the bathroom. Does not call prior to getting up. Has been reminded repeatedly to call prior to getting out of bed. Patient seems forgetful regarding those instructions. He does remain in his room and is steady on his feet when moving in the room.  ? ?Patient repeatedly requesting the bed alarm off. Explained the reason why bed alarm is on. Patient still wanted the bed alarm off.  ? ?Bed alarm is off at this time but bed is in lowest setting, call bell beside patient.  ?

## 2022-02-13 ENCOUNTER — Institutional Professional Consult (permissible substitution): Payer: Medicare PPO | Admitting: Pulmonary Disease

## 2022-02-19 NOTE — Progress Notes (Signed)
Carelink Summary Report / Loop Recorder 

## 2022-02-26 ENCOUNTER — Telehealth: Payer: Self-pay | Admitting: Neurology

## 2022-02-26 ENCOUNTER — Telehealth: Payer: Self-pay | Admitting: Cardiology

## 2022-02-26 NOTE — Telephone Encounter (Signed)
I spoke to [place] w/ [facility]. The patient is seen here for history of stroke and memory loss. He is established with cardiology, Dr. Curt Bears. Deidre says she is on hold with his office now awaiting further instructions.  ?

## 2022-02-26 NOTE — Telephone Encounter (Signed)
Spoke with Deirdre nurse with [facility].  Deirdre reports pt HR is in the 30's pt is asymptomatic.  First noted by nurse last week. BP today 122/74 Pt has a history of PVC's and Afib.  Will forward this message to Dr. Curt Bears nurse Sherri to follow up with pt.   ?

## 2022-02-26 NOTE — Telephone Encounter (Signed)
Shane H. from [facility] called needing to speak to the RN regarding the pt who is having chronic low Pulse of 33 ?Please call and advise. ?

## 2022-02-26 NOTE — Telephone Encounter (Signed)
Followed up with wife, dpr on file. ?Educated wife that pt's HRs are not in the 30s d/t frequent PVCs. ?Aware will send to [facility] for review/advisement. ?Aware once he advises will schedule overdue follow up appt (cancelled April f/u due to not feeling well). ?Informed that we may switch to another antiarrythmic vs OV first. ?Wife aware I will return call once MD advises.  She is agreeable to plan.  ? ? ?

## 2022-03-10 ENCOUNTER — Other Ambulatory Visit: Payer: Self-pay | Admitting: Cardiology

## 2022-03-10 ENCOUNTER — Ambulatory Visit (INDEPENDENT_AMBULATORY_CARE_PROVIDER_SITE_OTHER): Payer: Medicare PPO

## 2022-03-10 DIAGNOSIS — I639 Cerebral infarction, unspecified: Secondary | ICD-10-CM | POA: Diagnosis not present

## 2022-03-12 LAB — CUP PACEART REMOTE DEVICE CHECK
Date Time Interrogation Session: 20230524085232
Implantable Pulse Generator Implant Date: 20220610

## 2022-03-18 ENCOUNTER — Telehealth: Payer: Self-pay

## 2022-03-18 NOTE — Telephone Encounter (Signed)
Pts wife called regarding refil for Crestor. Pts wife stated pts Crestor was increased to 40 mg while in the hospital, and wanted to know if he should continue taking 40mg . If so she would need a new Rx sent to pt pharmacy. Pt stated she can be reached at [phone]. Please address.

## 2022-03-18 NOTE — Telephone Encounter (Signed)
Spoke to wife. Discussed who is following lipids, being that typically Dr. Curt Bears does not, but can if needed. Wife states they are actually seeing PCP in the morning and will discuss with them to take over following/prescribing (used to be followed by Dr. Irish Lack). Wife appreciates my return call. She will call us back if we need to re-address Rx refills w/ Dr. Curt Bears.

## 2022-03-19 ENCOUNTER — Telehealth: Payer: Self-pay | Admitting: *Deleted

## 2022-03-19 NOTE — Telephone Encounter (Signed)
Our office received a fax today of an EKG that Dr. Jeannetta Nap office did. Notes states on fax cover sheet: Pt's recent EKG in our office for your FYI & review. Pt has been advised to f/u for surgery clearance. JMB, RMA.   I tried to call their office to f/u on the portion stating about surgery clearance. I have not seen a clearance request come over to our office. Requesting office will need to fax over clearance request fax # [phone] attn: pre op team. Once we have the clearance request we may proceed with further review.   I tried to call their office but did not get through. I will fax this note to their office.

## 2022-03-20 NOTE — Telephone Encounter (Signed)
I s/w Larita Fife with Dr. Jeannetta Nap office in regard to EKG faxed to our office and stating to f/u with cardiology for surgery clearance.   I stated that I did not have a surgery clearance attached to the EKG fax. Larita Fife reviewed the pt's chart. She tells me that the pt's surgery has not been scheduled yet, due to Dr. Jeannetta Nap wants the pt to f/u w/cardiologist about the EKG that they did in their office yesterday. Larita Fife, states Dr. Jeannetta Nap knows the pt has appt 04/08/22 though wanted to see if possible to get the pt in sooner due to abnormal EKG. I assured Larita Fife that I will bring the EKG to EP scheduler [place] to see if may reach out to the pt with a sooner appt.   I assured Larita Fife, once the pt has been seen and the EKG has been reviewed as well, will have the provider fax over their notes to DR. Jeannetta Nap. Larita Fife, thanked me for the help. I assured Larita Fife, that I will fax over as FYI my notes from our conversation. Larita Fife, states she will  make a note in the pt's chart as well of our conversation today.

## 2022-03-21 NOTE — Telephone Encounter (Addendum)
   Pre-operative Risk Assessment    Patient Name: Shane Welch  DOB: 1941-07-16 MRN: [id number]   LEFT MESSAGE FOR DR. Marcelyn Bruins OFFICE TO PLEASE FAX OVER A CLEARANCE REQUEST TO FAX # [phone] ATTN: PRE OP TEAM. PT IS ON A BLOOD THINNER.   Request for Surgical Clearance    Procedure: PLEASE CLARIFY ACTUAL PROCEDURE; PCP SAYS IT WAS A BLADDER SURGERY  Date of Surgery:  Clearance TBD                                 Surgeon:  DR. Marcelyn Bruins Surgeon's Group or Practice Name:  [facility] Phone number: [phone] Fax number:  NEED FAX # PLEASE    Type of Clearance Requested:   - Medical  - Pharmacy:  Hold Rivaroxaban (Xarelto) ;    Type of Anesthesia:  Not Indicated; NEED TYPE OF ANESTHESIA THAT WILL BE USED    Additional requests/questions:    Shane Welch   03/21/2022, 4:44 PM

## 2022-03-24 NOTE — Telephone Encounter (Signed)
Dr. Logan Bores called back and left a vm for me stating they have no surgical procedure scheduled for the pt, stating not sure what we are asking about. I called back and left a message; per PCP and pt they are under the assumption the pt is supposed to have some bladder procedure. I left message surgery may not be scheduled yet on their end, though if the pt is going to have a procedure that we will still need a clearance request to be faxed to our office. I asked for their office to call me back to discuss further.

## 2022-03-24 NOTE — Telephone Encounter (Addendum)
I s/w Dr. Jeannetta Nap office this morning and they were able to tell me that all they know as well is the pt is having some sort of bladder surgery with Dr. Norberto Sorenson. They did not know for sure what office for Dr. Logan Bores. I did some research on google and did find a Dr. Marcelyn Bruins with [facility]. I did call and leave a detailed message to please call [phone] to confirm if I had the correct Dr. Logan Bores office for this pt. I then s/w the pt's wife and she did confirm for me the pt is seeing Dr. Marcelyn Bruins with [facility]. I confirmed that is also [facility], [facility]. I assured her that I left a message for their office to please fax over a clearance request on this pt. Please see previous notes .    I called Dr. Logan Bores office back to let inform that I did s/w the pt's wife and confirmed I had the right Dr. Logan Bores office. I was able to s/w the operator. She took a message that I needed a clearance request faxed to our office, clarify procedure, anesthesia being used, date of procedure and what fax # to fax back clearance once the pt has been cleared. I also gave my fax # [phone] for clearance request to be faxed to our office.

## 2022-03-26 NOTE — Telephone Encounter (Signed)
Dr. Logan Bores office called and left another vm that the pt is going to see DR. Clayburn Pert, before making any decision on what the surgery will entail. VM left today for me was that once the decision has been made as to what the surgery will be they will fax over a clearance request.

## 2022-03-26 NOTE — Telephone Encounter (Signed)
I have placed the notes from PCP in [facility] box for upcoming appt 04/01/22.   I will fax these notes as FYI to Dr. Amalia Hailey office. I faxed this note to fax # [phone]

## 2022-03-26 NOTE — Progress Notes (Signed)
Carelink Summary Report / Loop Recorder 

## 2022-03-30 ENCOUNTER — Other Ambulatory Visit: Payer: Self-pay | Admitting: Cardiology

## 2022-03-30 NOTE — Progress Notes (Unsigned)
Cardiology Office Note Date:  03/30/2022  Patient ID:  Shane Welch 1941-03-31, MRN [id number] PCP:  Kaleen Mask, MD  Cardiologist:  Dr. Eldridge Dace Electrophysiologist: Dr. Elberta Fortis    Chief Complaint:  6 weeks follow up  History of Present Illness: Shane Welch is a 81 y.o. male with history of DM, HTN, HLD, CAD (PCI to LAD 2017), stroke > loop > AFib  More recently saw A. Tiller, PA-C 07/15/21, c/o fatigue that was not new, reported being told of slow HRs by pulse ox at various office visits, discussed Frequent PVCs / Bradysphygmia, no true bradycardia has been noted. "Bradycardia event" on loop recorder was false due to undersensing after auto-gained for PVCs.  He wore a 3 day monitor that showed a minimum HR of 61 bpm, but >33% PVCs and started on Toprol, with mention of consideration to start mexiletine at his next visit. Planned for 6 week follow up.  I saw him Nov 2022 He is accompanied by his wife. He is clearly very frustrated about how he is feeling, says he has spoken to all of his providers and no one seems to be interested or have explanations, he is ready to just stop all of his medicines.Marland Kitchen He reports that ever since his stroke he has felt weak, tired, can do nothing without having to sit because he feels so weak. We spent a lot of time today trying to get a handle on what this is. He is tired, all of the time. He also feels physically weak, more so since increasing the Crestor, but even before that feels like his legts just do not have the strength to hold him up. And orthostatic., he tries to keep track of this, is no longer falling like he had been early on after his stroke, but always looking for a chair or somewhere to sit in case he needs to quickly (In review of his chart, this seems to echo his concerns/symptoms after his MI) 2. CP In the last couple months he is having a pressure in his center chest, no radiation, not particularly exertional or  positional. No diaphoresis, perhaps a little winded with it A couple weeks ago had a few in a day, some lasting hours.  They seem to come and go, no warm up or down No syncope No palpitations The only thing that is better is his memory since starting aricept by Der. Sethi at his last visit 3. We have identified AFib on his loop  With some orthostatic symptoms Toprol reduced, PVC counter turned on DAPT stopped > Eliquis Planned to f/u with Dr. Abe People about his lipids   He saw Dr. Elberta Fortis Jan 2023, PVC burden 19% and started on mexiletine Report of nose bleeds and his eliquis changed to Xarelto Aortic aneurysm and poss ILD noted on CT scan, lipids and f/u deferred to gen cards and referred to pulmonary  There are numerous RN notes regarding possible upcoming bladder surgery, unclear yet what it is or when.  *** AFib burden *** PVC burden *** lipids/Ao as per gen cards, due to be seen *** bleeding xarelto, nose bleeds? 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, cryptogenic stroke   Past Medical History:  Diagnosis Date   CAD (coronary artery disease) 12/10/2015   Ant MI 2/17>>DES to LAD // Myoview 12/22: EF 37, no ischemia (int risk)   CKD (chronic kidney disease), stage III (HCC) 10/01/2021   Diabetes (HCC) 12/10/2015   Dilated aortic root (  [place]) 09/06/2018   Echo 6/17: 42 mm // Echo 10/2018: 43 mm // Echocardiogram 3/22: 42 mm // Echo 12/22: 44 mm   Essential hypertension 09/03/2017   Hyperlipidemia    Ischemic cardiomyopathy    s/p Ant MI 2/17 - Echo EF 40-45 // Echo 6/17: EF 55 // Echo 10/2018: EF 50 // Echo 3/22: EF 50-55   Paroxysmal atrial fibrillation (HCC) 09/30/2021   S/p CVA in 3/22 >> ILR // AFib IDd on ILR>>anticoagulation started   PVC's (premature ventricular contractions) 09/30/2021   Monitor 9/22: PVCs 33% // Metoprolol succ started // ILR interr 11/22: PVCs 22.5% // Echo 12/22: Frequent PVCs; EF 45-50, global HK, mild concentric LVH, normal  RVSF, trivial MR, trivial AI, aortic root 44 mm   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 Dx ~ 2010.    Past Surgical History:  Procedure Laterality Date   CARDIAC CATHETERIZATION N/A 12/08/2015   Procedure: Left Heart Cath and Coronary Angiography;  Surgeon: Jettie Booze, MD;  Location: [facility];  Service: Cardiovascular;  Laterality: N/A;   CARDIAC CATHETERIZATION  12/08/2015   Procedure: Coronary Stent Intervention;  Surgeon: Jettie Booze, MD;  Location: [facility];  Service: Cardiovascular;;   CATARACT EXTRACTION W/PHACO Left 11/13/2021   Procedure: CATARACT EXTRACTION PHACO AND INTRAOCULAR LENS PLACEMENT ([place]) LEFT;  Surgeon: Leandrew Koyanagi, MD;  Location: [facility];  Service: Ophthalmology;  Laterality: Left;  9.50 01:22.5   CATARACT EXTRACTION W/PHACO Right 11/27/2021   Procedure: CATARACT EXTRACTION PHACO AND INTRAOCULAR LENS PLACEMENT ([place]) RIGHT 8.84 01:28.4;  Surgeon: Leandrew Koyanagi, MD;  Location: [facility];  Service: Ophthalmology;  Laterality: Right;   CERVICAL SPINE SURGERY     a. ~ 2002.   Right Knee Surgery     a. 08/2015.    Current Outpatient Medications  Medication Sig Dispense Refill   acetaminophen (TYLENOL) 325 MG tablet Take 650 mg by mouth every 6 (six) hours as needed for mild pain, moderate pain or headache.     finasteride (PROSCAR) 5 MG tablet Take 1 tablet by mouth daily. (Patient not taking: Reported on 02/07/2022)     glipiZIDE (GLUCOTROL XL) 5 MG 24 hr tablet Take 5 mg by mouth daily with breakfast.     metFORMIN (GLUCOPHAGE) 1000 MG tablet Take 1 tablet (1,000 mg total) by mouth 2 (two) times daily with a meal. 60 tablet 3   metoprolol succinate (TOPROL XL) 25 MG 24 hr tablet Take 1 tablet (25 mg total) by mouth at bedtime. 30 tablet 0   mexiletine (MEXITIL) 250 MG capsule Take 1 capsule (250 mg total) by mouth 2 (two)  times daily. (Patient not taking: Reported on 02/07/2022) 60 capsule 3   midodrine (PROAMATINE) 2.5 MG tablet Take 1 tablet (2.5 mg total) by mouth 3 (three) times daily with meals. (Patient not taking: Reported on 02/07/2022) 30 tablet 0   midodrine (PROAMATINE) 2.5 MG tablet Take 1 tablet (2.5 mg total) by mouth 3 (three) times daily with meals. Please keep upcoming appointment for future refills. Thank you. 90 tablet 0   Multiple Vitamin (MULTIVITAMIN WITH MINERALS) TABS tablet Take 1 tablet by mouth daily.     nitroGLYCERIN (NITROSTAT) 0.4 MG SL tablet Place 0.4 mg under the tongue as needed. (Patient not taking: Reported on 02/07/2022)     Omega-3 Fatty Acids (FISH OIL) 1000 MG CAPS  Take 1 capsule by mouth daily.     Rivaroxaban (XARELTO) 15 MG TABS tablet Take 1 tablet (15 mg total) by mouth daily. Please keep upcoming appointment for future refills. Thank you. 30 tablet 0   rosuvastatin (CRESTOR) 40 MG tablet Take 1 tablet (40 mg total) by mouth at bedtime. 30 tablet 0   tamsulosin (FLOMAX) 0.4 MG CAPS capsule  (Patient not taking: Reported on 02/07/2022)     No current facility-administered medications for this visit.    Allergies:   Sulfa antibiotics   Social History:  The patient  reports that he has quit smoking. His smoking use included cigarettes. He has a 30.00 pack-year smoking history. He has never used smokeless tobacco. He reports current alcohol use. He reports that he does not use drugs.   Family History:  The patient's family history includes Cancer in his father; Dementia in his mother.  ROS:  Please see the history of present illness.    All other systems are reviewed and otherwise negative.   PHYSICAL EXAM:  VS:  There were no vitals taken for this visit. BMI: There is no height or weight on file to calculate BMI. Well nourished, well developed, in no acute distress HEENT: normocephalic, atraumatic Neck: no JVD, carotid bruits or masses Cardiac:  *** RRR; no significant  murmurs, no rubs, or gallops Lungs:  *** CTA b/l, no wheezing, rhonchi or rales Abd: soft, nontender MS: no deformity or atrophy Ext: *** no edema Skin: warm and dry, no rash Neuro:  No gross deficits appreciated Psych: euthymic mood, full affect  ILR site ***   EKG:  not done today  Device interrogation done today and reviewed by myself:  ***   TTE 01/08/21   1. Left ventricular ejection fraction, by estimation, is 50 to 55%. The  left ventricle has normal function. The left ventricle has no regional  wall motion abnormalities. There is mild left ventricular hypertrophy.  Left ventricular diastolic parameters  are indeterminate.   2. Right ventricular systolic function is normal. The right ventricular  size is normal.   3. The mitral valve is normal in structure. No evidence of mitral valve  regurgitation. No evidence of mitral stenosis.   4. The aortic valve is tricuspid. Aortic valve regurgitation is not  visualized. No aortic stenosis is present.   5. Aortic dilatation noted. There is mild dilatation of the aortic root,  measuring 42 mm.   6. The inferior vena cava is normal in size with greater than 50%  respiratory variability, suggesting right atrial pressure of 3 mmHg.    12/08/2015: LHC/PCI Mid LAD lesion, 99% stenosed. Post intervention with aspiration thrombectomy and a 3.0 x 20 Promus drug-eluting stent postdilated to 3.5 mm, there is a 0% residual stenosis. Normal LVEDP. Left ventricular gram not done because wants to avoid excessive contrast exposure. From the right wrist, an EBU 3.5 guide catheter may better engage the left main a future procedure is needed. Left dominant system. Non-dominant RCA was no selectively injected.   Continue dual antiplatelet therapy for at least a year. He'll need aggressive secondary prevention and diabetes control. Continue IV tirofiban for 4 hours. He'll be watched in the ICU. I suspect to be in the hospital for 2 days. Assess  left ventricular function with echocardiogram.     Recent Labs: 02/09/2022: ALT 16; BUN 19; Creatinine, Ser 1.62; Hemoglobin 14.8; Magnesium 1.8; Platelets 197; Potassium 4.0; Sodium 141  02/08/2022: Cholesterol 180; HDL 28; LDL Cholesterol 119; Total  CHOL/HDL Ratio 6.4; Triglycerides 165; VLDL 33   CrCl cannot be calculated (Patient's most recent lab result is older than the maximum 21 days allowed.).   Wt Readings from Last 3 Encounters:  02/08/22 210 lb 1.6 oz (95.3 kg)  12/03/21 204 lb 0.6 oz (92.6 kg)  11/27/21 202 lb (91.6 kg)     Other studies reviewed: Additional studies/records reviewed today include: summarized above  ASSESSMENT AND PLAN:  ILR Cryptogenic stroke 2. Paroxysmal AFib CHA2DS2Vasc is 7, on *** Xarelto appropriately dosed *** % burden  PVCs *** % burden *** mexiletine  CAD ***      Disposition: ***  Current medicines are reviewed at length with the patient today.  The patient did not have any concerns regarding medicines.  Venetia Night, PA-C 03/30/2022 1:41 PM     [address] [place] [id number] [phone] (office)  [phone] (fax)

## 2022-04-01 ENCOUNTER — Ambulatory Visit (INDEPENDENT_AMBULATORY_CARE_PROVIDER_SITE_OTHER): Payer: Medicare PPO | Admitting: Physician Assistant

## 2022-04-01 ENCOUNTER — Encounter: Payer: Self-pay | Admitting: Physician Assistant

## 2022-04-01 VITALS — BP 116/70 | HR 76 | Ht 71.0 in | Wt 202.4 lb

## 2022-04-01 DIAGNOSIS — Z4509 Encounter for adjustment and management of other cardiac device: Secondary | ICD-10-CM

## 2022-04-01 DIAGNOSIS — I428 Other cardiomyopathies: Secondary | ICD-10-CM | POA: Diagnosis not present

## 2022-04-01 DIAGNOSIS — I2510 Atherosclerotic heart disease of native coronary artery without angina pectoris: Secondary | ICD-10-CM | POA: Diagnosis not present

## 2022-04-01 DIAGNOSIS — I480 Paroxysmal atrial fibrillation: Secondary | ICD-10-CM | POA: Diagnosis not present

## 2022-04-01 DIAGNOSIS — I493 Ventricular premature depolarization: Secondary | ICD-10-CM

## 2022-04-01 DIAGNOSIS — Z01818 Encounter for other preprocedural examination: Secondary | ICD-10-CM

## 2022-04-01 LAB — CUP PACEART INCLINIC DEVICE CHECK
Date Time Interrogation Session: 20230613124050
Implantable Pulse Generator Implant Date: 20220610

## 2022-04-01 NOTE — Patient Instructions (Signed)
Medication Instructions:   Your physician recommends that you continue on your current medications as directed. Please refer to the Current Medication list given to you today.  *If you need a refill on your cardiac medications before your next appointment, please call your pharmacy*   Lab Work: NONE ORDERED  TODAY   If you have labs (blood work) drawn today and your tests are completely normal, you will receive your results only by: MyChart Message (if you have MyChart) OR A paper copy in the mail If you have any lab test that is abnormal or we need to change your treatment, we will call you to review the results.   Testing/Procedures:  Your physician has requested that you have an echocardiogram. Echocardiography is a painless test that uses sound waves to create images of your heart. It provides your doctor with information about the size and shape of your heart and how well your heart's chambers and valves are working. This procedure takes approximately one hour. There are no restrictions for this procedure.   Follow-Up: At [facility], you and your health needs are our priority.  As part of our continuing mission to provide you with exceptional heart care, we have created designated Provider Care Teams.  These Care Teams include your primary Cardiologist (physician) and Advanced Practice Providers (APPs -  Physician Assistants and Nurse Practitioners) who all work together to provide you with the care you need, when you need it.  We recommend signing up for the patient portal called "MyChart".  Sign up information is provided on this After Visit Summary.  MyChart is used to connect with patients for Virtual Visits (Telemedicine).  Patients are able to view lab/test results, encounter notes, upcoming appointments, etc.  Non-urgent messages can be sent to your provider as well.   To learn more about what you can do with MyChart, go to [email or web address].    Your next appointment:    over due   Dr Eldridge Dace  needed  ASAP  next available  6 month(s)  The format for your next appointment:   In Person  Provider:   You may see Will Jorja Loa, MD or one of the following Advanced Practice Providers on your designated Care Team:   Francis Dowse, [place] Casimiro Needle "Mardelle Matte" Lanna Poche, [place]   Other Instructions   Important Information About Sugar

## 2022-04-02 NOTE — Addendum Note (Signed)
Addended by: Daleen Bo I on: 04/02/2022 05:42 PM   Modules accepted: Orders

## 2022-04-03 ENCOUNTER — Encounter: Payer: Self-pay | Admitting: Neurology

## 2022-04-03 ENCOUNTER — Ambulatory Visit: Payer: Medicare PPO | Admitting: Neurology

## 2022-04-03 VITALS — BP 116/73 | HR 82 | Ht 71.0 in | Wt 202.2 lb

## 2022-04-03 DIAGNOSIS — F02A0 Dementia in other diseases classified elsewhere, mild, without behavioral disturbance, psychotic disturbance, mood disturbance, and anxiety: Secondary | ICD-10-CM | POA: Diagnosis not present

## 2022-04-03 DIAGNOSIS — G301 Alzheimer's disease with late onset: Secondary | ICD-10-CM

## 2022-04-03 DIAGNOSIS — R269 Unspecified abnormalities of gait and mobility: Secondary | ICD-10-CM | POA: Diagnosis not present

## 2022-04-03 MED ORDER — MEMANTINE HCL 28 X 5 MG & 21 X 10 MG PO TABS: ORAL_TABLET | ORAL | 12 refills | Status: DC

## 2022-04-03 MED ORDER — MEMANTINE HCL 10 MG PO TABS: 10.0000 mg | ORAL_TABLET | Freq: Two times a day (BID) | ORAL | 3 refills | Status: DC

## 2022-04-03 NOTE — Patient Instructions (Signed)
I had a long discussion with patient and wife regarding his recent episode of confusion and TIA and discussed the need to be compliant with taking his Xarelto for anticoagulation for A-fib for stroke prevention and answered questions.  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  Recommend starting with a starter pack and if tolerated increase to 10 mg twice daily.  We also discussed memory compensation strategies.  Outpatient referral to physical therapy for gait and balance training also.  Return for follow-up in 3 months or call earlier if necessary. Memory Compensation Strategies  Use "WARM" strategy.  W= write it down  A= associate it  R= repeat it  M= make a mental note  2.   You can keep a Glass blower/designer.  Use a 3-ring notebook with sections for the following: calendar, important names and phone numbers,  medications, doctors' names/phone numbers, lists/reminders, and a section to journal what you did  each day.   3.    Use a calendar to write appointments down.  4.    Write yourself a schedule for the day.  This can be placed on the calendar or in a separate section of the Memory Notebook.  Keeping a  regular schedule can help memory.  5.    Use medication organizer with sections for each day or morning/evening pills.  You may need help loading it  6.    Keep a basket, or pegboard by the door.  Place items that you need to take out with you in the basket or on the pegboard.  You may also want to  include a message board for reminders.  7.    Use sticky notes.  Place sticky notes with reminders in a place where the task is performed.  For example: " turn off the  stove" placed by the stove, "lock the door" placed on the door at eye level, " take your medications" on  the bathroom mirror or by the place where you normally take your medications.  8.    Use alarms/timers.  Use while cooking  to remind yourself to check on food or as a reminder to take your medicine, or as a  reminder to make a call, or as a reminder to perform another task, etc.

## 2022-04-03 NOTE — Progress Notes (Signed)
[facility] [address] [place]. [place] [address] [phone]       OFFICE FOLLOW UP VISIT NOTE  [name] Date of Birth:  26-Dec-1940 Medical Record Number:  [id number]   Referring MD: Windle Guard Reason for Referral: Stroke HPI: Initial visit 02/19/2021: [name] is a pleasant 5 Caucasian male seen today for initial office consultation visit for stroke.  He is accompanied by his wife [name] and history is obtained from them and review of electronic medical records and I personally reviewed available imaging films in PACS.  He has past medical history of diabetes, hypertension, hyperlipidemia and coronary artery disease.  He was seen on 01/07/2021 at [facility].  He was sitting on a stool in his kitchen table when if fell down.  He is unable to get up and was off balance.  He was also confused and disoriented.  He stated he was seeing black spots in front of both eyes.  He was brought in by EMS and was quite agitated when he came in.  Patient states he has no memory of his 5-day hospitalization.  He was seen on arrival by telemetry neurologist who thought he had a TIA and NIH stroke scale on admission was 0.  CT scan of the head on admission was unremarkable and subsequent hospital admission and MRI and further work-up was suggested.  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  2D echo showed normal ejection fraction without cardiac source of embolism.  LDL cholesterol was 1 1 3  mg percent.  Hemoglobin A1c was 6.8.  Urine drug screen was negative.  Patient was discharged on aspirin and Plavix for 3 weeks and subsequently Plavix alone.  Patient states he still sees occasional black spots in front of his eyes but his vision is mostly  improved.  He is also improved in his coordination and balance.  He however has noticed worsening of his baseline memory and cognitive difficulties which he was actually having for the last 5 6 years ago and before his stroke.  He also complains of feeling tired and having low energy.  Patient was started on Lipitor but his primary care physician has changed him to Crestor because of urinary retention.  Patient does have family history of Alzheimer's in his mother and worries about it.  He is tolerating Plavix well without bruising or bleeding.  His blood pressure is well controlled today slightly elevated in office at 146/95.  He was having some orthostatic dizziness but he has been started on midodrine which seems to have helped.  He has no prior history of strokes TIA seizures prior neurological problems.  He denies any history of atrial fibrillation, cardiac arrhythmias but does have history of coronary artery disease and is undergone stenting by Dr. Eldridge Dace. Update 07/09/2021 : He returns for follow-up after last visit 4 months ago.  He is accompanied by his wife.  She states that his memory is not better.  He continues to have short-term memory difficulties and trouble remembering recent information.  He struggles with calculations and with numbers.  He still mostly independent but wife has to keep a close watch on him.  He denies any symptoms of delusion, hallucinations, unsafe behavior.  His gait and balance are good with no  falls or injuries.  He has been having some bradycardia and has an appointment upcoming to see cardiologist Dr. Elberta Fortis for this.  He has finished working with physical therapy for his balance.  He had lab work for reversible causes of memory loss on 02/19/2021 all of which were normal.  EEG on 03/08/2019 showed mild generalized slowing without epileptiform activity.  He had a loop recorder inserted and so for paroxysmal A. fib has not yet been found.  On Mini-Mental status exam testing  today scored 24/30.  He has s was not found to be depressed on geriatric depression scale.   Update 11/11/2021: He returns for follow-up after last visit 4 months ago.  He is accompanied by his wife.  He states he noticed improvement in his cognitive difficulties after starting Aricept and was tolerating 10 mg daily quite well but then after the first few weeks of improvement his benefit plateaued out.  He however started having some nasal bleeding and he felt this may have been related to Aricept so he reduced the dose to 5 mg.  However his cardiologist recently switched Eliquis to Xarelto last Friday and so far is tolerated well without any recurrence of nasal bleeding.  Patient has not had any recurrent stroke or TIA symptoms.  He had lipid profile on 10/07/2021 with LDL cholesterol being optimal at 43 mg percent.  On Mini-Mental status exam today scored 24/30 on recall 1/3 and able to name 9 animals which can walk on 4 legs.  On the clock drawing he scored 3/4.  This appears unchanged compared to last visit Update 04/03/2022 : He returns for follow-up after last visit for an months ago.  He is accompanied by his wife who provides most of the history.  Patient was admitted on 02/07/2022 to [facility] with sudden onset of speech difficulties.  He had difficult time finding the right words to use and family noted that he was struggling to communicate.  Symptoms improved after reaching the emergency room and hence was not felt to be a candidate for thrombolysis.  Patient had a history of atrial fibrillation and previously been on Eliquis but was discontinued due to nasal bleed.  He was switched to Xarelto but patient electively stopped taking it several months ago.  He also stopped his most of his medications except for diabetes medicine.  CT head was negative for acute process.  Lab work was significant for elevated creatinine of 1.8.  MRI scan of the brain showed no acute infarct.  Echocardiogram showed  decreased ejection fraction of 40 to 45% with mild global hypokinesis.  LDL cholesterol is elevated at 119 mg percent and hemoglobin A1c was 6.6.  Patient was counseled to be compliant with all his medications and restarted on anticoagulation with Xarelto.  Patient has been pain at home but he has been having a urinary problem and has a indwelling urinary catheter.  They are waiting for follow-up appointment with [facility] to see if catheter can come out.  Patient is able to walk with a cane but is unsteady and his wife has to walk close behind him.  He has had 2 falls in the last 3 weeks.  Patient states that when he walks his feet move faster and he has trouble and slowing himself down.  Continues to have memory difficulties and cognitive impairment.  He is tolerating Aricept in the past.  He was offered Namenda but declined in the past but states now he is willing  to try it.  He does get some occasional visual hallucinations and does get disoriented from time to time.  He scored 22/30 on Mini-Mental testing today which is slight decline from 24/30 at last visit. ROS:   14 system review of systems is positive for nasal bleeding,, headache, calculation difficonfusion, disorientation, memory loss, decreased energy, fatigue, falls, walking difficulty, urinary retention, generalized weakness.tiredness all other systems negative  PMH:  Past Medical History:  Diagnosis Date   CAD (coronary artery disease) 12/10/2015   Ant MI 2/17>>DES to LAD // Myoview 12/22: EF 37, no ischemia (int risk)   CKD (chronic kidney disease), stage III ([place]) 10/01/2021   Diabetes ([place]) 12/10/2015   Dilated aortic root ([place]) 09/06/2018   Echo 6/17: 42 mm // Echo 10/2018: 43 mm // Echocardiogram 3/22: 42 mm // Echo 12/22: 44 mm   Essential hypertension 09/03/2017   Hyperlipidemia    Ischemic cardiomyopathy    s/p Ant MI 2/17 - Echo EF 40-45 // Echo 6/17: EF 55 // Echo 10/2018: EF 50 // Echo 3/22: EF 50-55   Paroxysmal  atrial fibrillation ([place]) 09/30/2021   S/p CVA in 3/22 >> ILR // AFib IDd on ILR>>anticoagulation started   PVC's (premature ventricular contractions) 09/30/2021   Monitor 9/22: PVCs 33% // Metoprolol succ started // ILR interr 11/22: PVCs 22.5% // Echo 12/22: Frequent PVCs; EF 45-50, global HK, mild concentric LVH, normal RVSF, trivial MR, trivial AI, aortic root 44 mm   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 Dx ~ 2010.    Social History:  Social History   Socioeconomic History   Marital status: Married    Spouse name: Vaughan Basta   Number of children: Not on file   Years of education: Not on file   Highest education level: Not on file  Occupational History   Not on file  Tobacco Use   Smoking status: Former    Packs/day: 1.00    Years: 30.00    Total pack years: 30.00    Types: Cigarettes   Smokeless tobacco: Never   Tobacco comments:    quit 1993  Vaping Use   Vaping Use: Never used  Substance and Sexual Activity   Alcohol use: Yes    Alcohol/week: 0.0 standard drinks of alcohol    Comment: rare drink   Drug use: No   Sexual activity: Not on file  Other Topics Concern   Not on file  Social History Narrative   Lives with wife and son   Right Handed   Drinks 2-3 cups caffeine daily   Social Determinants of Health   Financial Resource Strain: Not on file  Food Insecurity: Not on file  Transportation Needs: Not on file  Physical Activity: Not on file  Stress: Not on file  Social Connections: Not on file  Intimate Partner Violence: Not on file    Medications:   Current Outpatient Medications on File Prior to Visit  Medication Sig Dispense Refill   acetaminophen (TYLENOL) 325 MG tablet Take 650 mg by mouth every 6 (six) hours as needed for mild pain, moderate pain or headache.     finasteride (PROSCAR) 5 MG tablet Take 1 tablet by mouth daily.     glipiZIDE (GLUCOTROL XL) 5 MG 24 hr tablet  Take 5 mg by mouth daily with breakfast.     metFORMIN (GLUCOPHAGE) 1000 MG tablet Take 1 tablet (  1,000 mg total) by mouth 2 (two) times daily with a meal. 60 tablet 3   metoprolol succinate (TOPROL XL) 25 MG 24 hr tablet Take 1 tablet (25 mg total) by mouth at bedtime. 30 tablet 0   mexiletine (MEXITIL) 250 MG capsule TAKE 1 CAPSULE(250 MG) BY MOUTH TWICE DAILY 60 capsule 9   midodrine (PROAMATINE) 2.5 MG tablet Take 1 tablet (2.5 mg total) by mouth 3 (three) times daily with meals. Please keep upcoming appointment for future refills. Thank you. 90 tablet 0   Multiple Vitamin (MULTIVITAMIN WITH MINERALS) TABS tablet Take 1 tablet by mouth daily.     nitroGLYCERIN (NITROSTAT) 0.4 MG SL tablet Place 0.4 mg under the tongue as needed.     Rivaroxaban (XARELTO) 15 MG TABS tablet Take 1 tablet (15 mg total) by mouth daily. Please keep upcoming appointment for future refills. Thank you. 30 tablet 0   rosuvastatin (CRESTOR) 40 MG tablet Take 1 tablet (40 mg total) by mouth at bedtime. 30 tablet 0   tamsulosin (FLOMAX) 0.4 MG CAPS capsule      No current facility-administered medications on file prior to visit.    Allergies:   Allergies  Allergen Reactions   Sulfa Antibiotics Hives    Physical Exam General: well developed, well nourished elderly Caucasian male, seated, in no evident distress Head: head normocephalic and atraumatic.   Neck: supple with no carotid or supraclavicular bruits Cardiovascular: regular rate and rhythm, no murmurs Musculoskeletal: no deformity Skin:  no rash/petichiae Vascular:  Normal pulses all extremities  Neurologic Exam Mental Status: Awake and fully alert. Oriented to place and time. Recent and remote memory diminished. Attention span, concentration and fund of knowledge appropriate. Mood and affect appropriate.  Recall 1/3.  Able to name 11 animals which can walk on 4 legs.  Clock drawing 3/4.  Mini-Mental status exam score 2230.  Geriatric depression scale not  depressed.. Cranial Nerves: Fundoscopic exam not done . Pupils equal, briskly reactive to light. Extraocular movements full without nystagmus. Visual fields full to confrontation. Hearing diminished bilaterally. Facial sensation intact. Face, tongue, palate moves normally and symmetrically.  Motor: Normal bulk and tone. Normal strength in all tested extremity muscles. Sensory.: intact to touch , pinprick , position and vibratory sensation.  Coordination: Rapid alternating movements normal in all extremities. Finger-to-nose and heel-to-shin performed accurately bilaterally. Gait and Station: Arises from chair without difficulty. Stance is normal.  Uses a cane.  Gait demonstrates slight wide-based and imbalance while turning..  Not able to heel, toe and tandem walk .  Reflexes: 1+ and symmetric except both ankle jerks are depressed slightly.. Toes downgoing.       04/03/2022    2:08 PM 11/11/2021    3:04 PM 07/09/2021    2:03 PM  MMSE - Mini Mental State Exam  Orientation to time 4 5 2   Orientation to Place 4 5 5   Registration 3 3 3   Attention/ Calculation 3 2 5   Recall 0 1 0  Language- name 2 objects 2 2 2   Language- repeat 1 1 1   Language- follow 3 step command 3 3 3   Language- read & follow direction 1 1 1   Write a sentence 1 1 1   Copy design 0 0 1  Copy design-comments   4 legged animals x 1 minute: 11  Total score 22 24 24        04/03/2022    2:08 PM 11/11/2021    3:04 PM 07/09/2021    2:03 PM  MMSE -  Mini Mental State Exam  Orientation to time 4 5 2   Orientation to Place 4 5 5   Registration 3 3 3   Attention/ Calculation 3 2 5   Recall 0 1 0  Language- name 2 objects 2 2 2   Language- repeat 1 1 1   Language- follow 3 step command 3 3 3   Language- read & follow direction 1 1 1   Write a sentence 1 1 1   Copy design 0 0 1  Copy design-comments   4 legged animals x 1 minute: 11  Total score 22 24 24      ASSESSMENT: 81 year old Caucasian male with 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  Vascular risk factors of coronary artery disease, diabetes, hypertension, hyperlipidemia.  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  Recent episode of left hemispheric TIA due to noncompliance with anticoagulation for A-fib.     PLAN: I had a long discussion with patient and wife regarding his recent episode of confusion and TIA and discussed the need to be compliant with taking his Xarelto for anticoagulation for A-fib for stroke prevention and answered questions.  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  Recommend starting with a starter pack and if tolerated increase to 10 mg twice daily.  We also discussed memory compensation strategies.  Outpatient referral to physical therapy for gait and balance training also.  Return for follow-up in 3 months or call earlier if necessary..Greater than 50% time during this 40-minute  visit was spent on counseling and coordination of care about his embolic occipital stroke,TIA, mild dementia as well as discussion about mild cognitive impairment and answering questions. Antony Contras, MD Note: This document was prepared with digital dictation and possible smart phrase technology. Any transcriptional errors that result from this process are unintentional.

## 2022-04-06 ENCOUNTER — Other Ambulatory Visit: Payer: Self-pay | Admitting: Cardiology

## 2022-04-07 ENCOUNTER — Telehealth: Payer: Self-pay | Admitting: Neurology

## 2022-04-07 NOTE — Telephone Encounter (Signed)
Prescription refill request for Xarelto received.  Indication:Afib Last office visit:6/23 Weight:91.7 kg Age:81 Scr:1.9 CrCl:40.22 ml/min  Prescription refilled

## 2022-04-07 NOTE — Telephone Encounter (Signed)
Referral for Physical Therapy sent to [facility] [place] [phone].

## 2022-04-08 ENCOUNTER — Encounter: Payer: Medicare PPO | Admitting: Student

## 2022-04-14 ENCOUNTER — Other Ambulatory Visit: Payer: Self-pay | Admitting: Physician Assistant

## 2022-04-14 ENCOUNTER — Ambulatory Visit (INDEPENDENT_AMBULATORY_CARE_PROVIDER_SITE_OTHER): Payer: Medicare PPO

## 2022-04-14 DIAGNOSIS — I639 Cerebral infarction, unspecified: Secondary | ICD-10-CM | POA: Diagnosis not present

## 2022-04-14 DIAGNOSIS — R9430 Abnormal result of cardiovascular function study, unspecified: Secondary | ICD-10-CM

## 2022-04-15 LAB — CUP PACEART REMOTE DEVICE CHECK
Date Time Interrogation Session: 20230627083525
Implantable Pulse Generator Implant Date: 20220610

## 2022-04-16 ENCOUNTER — Other Ambulatory Visit: Payer: Self-pay | Admitting: Cardiology

## 2022-04-17 ENCOUNTER — Ambulatory Visit (INDEPENDENT_AMBULATORY_CARE_PROVIDER_SITE_OTHER): Payer: Medicare PPO

## 2022-04-17 DIAGNOSIS — I480 Paroxysmal atrial fibrillation: Secondary | ICD-10-CM | POA: Diagnosis not present

## 2022-04-17 DIAGNOSIS — R9430 Abnormal result of cardiovascular function study, unspecified: Secondary | ICD-10-CM

## 2022-04-17 LAB — ECHOCARDIOGRAM LIMITED
Calc EF: 44.5 %
S' Lateral: 4.23 cm
Single Plane A2C EF: 44.9 %
Single Plane A4C EF: 42.8 %

## 2022-05-01 ENCOUNTER — Telehealth: Payer: Self-pay | Admitting: *Deleted

## 2022-05-01 NOTE — Telephone Encounter (Signed)
Patient with diagnosis of afib on Xarelto for anticoagulation.    Procedure: TURP, SUPRAPUBIC CATHETER PLACEMENT Date of procedure: 05/13/22  CHA2DS2-VASc Score = 7  This indicates a 11.2% annual risk of stroke. The patient's score is based upon: CHF History: 0 HTN History: 1 Diabetes History: 1 Stroke History: 2 Vascular Disease History: 1 Age Score: 2 Gender Score: 0  First CVA noted 12/2020, found to have afib and started on DOAC. Stopped taking his Xarelto, had TIA 02/07/22.  CrCl 71mL/min Platelet count 194K  Per office protocol, patient can hold Xarelto for 1 day prior to procedure. If longer Xarelto hold is required, will need MD input given recent TIA < 3 months ago when pt had stopped his anticoagulation.  **This guidance is not considered finalized until pre-operative APP has relayed final recommendations.**

## 2022-05-01 NOTE — Telephone Encounter (Signed)
Pt and his wife are in agreement to tele visit for pre op clearance 05/07/22 @ 2:20 pm. Med rec and consent are done.

## 2022-05-01 NOTE — Telephone Encounter (Signed)
   Pre-operative Risk Assessment    Patient Name: Shane Welch  DOB: Aug 31, 1941 MRN: [id number]      Request for Surgical Clearance    Procedure:   TURP, SUPRAPUBIC CATHETER PLACEMENT  Date of Surgery:  Clearance 05/13/22                                 Surgeon:  DR. Marcelyn Bruins Surgeon's Group or Practice Name:  [facility] Phone number:  [phone] ATTN: Henreitta Leber Fax number:  [phone]   Type of Clearance Requested:   - Medical  - Pharmacy:  Hold Rivaroxaban (Xarelto)     Type of Anesthesia:  Not Indicated (GENERAL?)   Additional requests/questions:    Elpidio Anis   05/01/2022, 10:21 AM

## 2022-05-01 NOTE — Telephone Encounter (Signed)
Pt and his wife are in agreement to tele visit for pre op clearance 05/07/22 @ 2:20 pm. Med rec and consent are done.     Patient Consent for Virtual Visit        [name] has provided verbal consent on 05/01/2022 for a virtual visit (video or telephone).   CONSENT FOR VIRTUAL VISIT FOR:  Shane Welch  By participating in this virtual visit I agree to the following:  I hereby voluntarily request, consent and authorize [facility] and its employed or contracted physicians, physician assistants, nurse practitioners or other licensed health care professionals (the Practitioner), to provide me with telemedicine health care services (the "Services") as deemed necessary by the treating Practitioner. I acknowledge and consent to receive the Services by the Practitioner via telemedicine. I understand that the telemedicine visit will involve communicating with the Practitioner through live audiovisual communication technology and the disclosure of certain medical information by electronic transmission. I acknowledge that I have been given the opportunity to request an in-person assessment or other available alternative prior to the telemedicine visit and am voluntarily participating in the telemedicine visit.  I understand that I have the right to withhold or withdraw my consent to the use of telemedicine in the course of my care at any time, without affecting my right to future care or treatment, and that the Practitioner or I may terminate the telemedicine visit at any time. I understand that I have the right to inspect all information obtained and/or recorded in the course of the telemedicine visit and may receive copies of available information for a reasonable fee.  I understand that some of the potential risks of receiving the Services via telemedicine include:  Delay or interruption in medical evaluation due to technological equipment failure or disruption; Information transmitted may not be  sufficient (e.g. poor resolution of images) to allow for appropriate medical decision making by the Practitioner; and/or  In rare instances, security protocols could fail, causing a breach of personal health information.  Furthermore, I acknowledge that it is my responsibility to provide information about my medical history, conditions and care that is complete and accurate to the best of my ability. I acknowledge that Practitioner's advice, recommendations, and/or decision may be based on factors not within their control, such as incomplete or inaccurate data provided by me or distortions of diagnostic images or specimens that may result from electronic transmissions. I understand that the practice of medicine is not an exact science and that Practitioner makes no warranties or guarantees regarding treatment outcomes. I acknowledge that a copy of this consent can be made available to me via my patient portal [facility] MyChart), or I can request a printed copy by calling the office of [facility].    I understand that my insurance will be billed for this visit.   I have read or had this consent read to me. I understand the contents of this consent, which adequately explains the benefits and risks of the Services being provided via telemedicine.  I have been provided ample opportunity to ask questions regarding this consent and the Services and have had my questions answered to my satisfaction. I give my informed consent for the services to be provided through the use of telemedicine in my medical care

## 2022-05-07 ENCOUNTER — Ambulatory Visit (INDEPENDENT_AMBULATORY_CARE_PROVIDER_SITE_OTHER): Payer: Medicare PPO | Admitting: Nurse Practitioner

## 2022-05-07 DIAGNOSIS — Z01810 Encounter for preprocedural cardiovascular examination: Secondary | ICD-10-CM

## 2022-05-07 NOTE — Progress Notes (Signed)
Virtual Visit via Telephone Note   Because of Shane Welch's co-morbid illnesses, he is at least at moderate risk for complications without adequate follow up.  This format is felt to be most appropriate for this patient at this time.  The patient did not have access to video technology/had technical difficulties with video requiring transitioning to audio format only (telephone).  All issues noted in this document were discussed and addressed.  No physical exam could be performed with this format.  Please refer to the patient's chart for his consent to telehealth for [facility].  Evaluation Performed:  Preoperative cardiovascular risk assessment _____________   Date:  05/07/2022   Patient ID:  Shane Welch, DOB 03-01-1941, MRN [id number] Patient Location:  Home Provider location:   Office  Primary Care Provider:  Kaleen Mask, MD Primary Cardiologist:  Lance Muss, MD  Chief Complaint / Patient Profile   81 y.o. y/o male with a h/o CAD s/p DES-LAD, ICM, paroxysmal atrial fibrillation, cryptogenic stroke s/p ILR, PVCs, hypertension, hyperlipidemia, CKD stage III, and type 2 diabetes,  who is pending TURP/suprapubic catheter placement on 05/13/2022 with Dr. Marcelyn Bruins of [facility] and presents today for telephonic preoperative cardiovascular risk assessment.  Past Medical History    Past Medical History:  Diagnosis Date   CAD (coronary artery disease) 12/10/2015   Ant MI 2/17>>DES to LAD // Myoview 12/22: EF 37, no ischemia (int risk)   CKD (chronic kidney disease), stage III (HCC) 10/01/2021   Diabetes (HCC) 12/10/2015   Dilated aortic root (HCC) 09/06/2018   Echo 6/17: 42 mm // Echo 10/2018: 43 mm // Echocardiogram 3/22: 42 mm // Echo 12/22: 44 mm   Essential hypertension 09/03/2017   Hyperlipidemia    Ischemic cardiomyopathy    s/p Ant MI 2/17 - Echo EF 40-45 // Echo 6/17: EF 55 // Echo 10/2018: EF 50 // Echo 3/22: EF 50-55   Paroxysmal atrial  fibrillation (HCC) 09/30/2021   S/p CVA in 3/22 >> ILR // AFib IDd on ILR>>anticoagulation started   PVC's (premature ventricular contractions) 09/30/2021   Monitor 9/22: PVCs 33% // Metoprolol succ started // ILR interr 11/22: PVCs 22.5% // Echo 12/22: Frequent PVCs; EF 45-50, global HK, mild concentric LVH, normal RVSF, trivial MR, trivial AI, aortic root 44 mm   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 Dx ~ 2010.   Past Surgical History:  Procedure Laterality Date   CARDIAC CATHETERIZATION N/A 12/08/2015   Procedure: Left Heart Cath and Coronary Angiography;  Surgeon: Corky Crafts, MD;  [facility] INVASIVE CV LAB;  Service: Cardiovascular;  Laterality: N/A;   CARDIAC CATHETERIZATION  12/08/2015   Procedure: Coronary Stent Intervention;  Surgeon: Corky Crafts, MD;  Location: [facility] INVASIVE CV LAB;  Service: Cardiovascular;;   CATARACT EXTRACTION W/PHACO Left 11/13/2021   Procedure: CATARACT EXTRACTION PHACO AND INTRAOCULAR LENS PLACEMENT (IOC) LEFT;  Surgeon: Lockie Mola, MD;  Location: [facility] SURGERY CNTR;  Service: Ophthalmology;  Laterality: Left;  9.50 01:22.5   CATARACT EXTRACTION W/PHACO Right 11/27/2021   Procedure: CATARACT EXTRACTION PHACO AND INTRAOCULAR LENS PLACEMENT (IOC) RIGHT 8.84 01:28.4;  Surgeon: Lockie Mola, MD;  Location: [facility];  Service: Ophthalmology;  Laterality: Right;   CERVICAL SPINE SURGERY     a. ~ 2002.   Right Knee Surgery     a. 08/2015.    Allergies  Allergies  Allergen Reactions   Sulfa Antibiotics Hives    History of Present Illness    Shane Welch is a 81 y.o. male who presents via audio/video conferencing for a telehealth visit today accompanied by his wife via speaker phone. Pt was last seen in cardiology clinic on 04/01/2022 by Shane Standard, PA.  At that time Shane Welch was stable from a cardiac standpoint. The patient is now  pending procedure as outlined above. Since his last visit, he has done well from a cardiac standpoint. He denies chest pain, palpitations, dyspnea, pnd, orthopnea, n, v, dizziness, syncope, edema, weight gain, or early satiety. All other systems reviewed and are otherwise negative except as noted above.   Home Medications    Prior to Admission medications   Medication Sig Start Date End Date Taking? Authorizing Provider  acetaminophen (TYLENOL) 325 MG tablet Take 650 mg by mouth every 6 (six) hours as needed for mild pain, moderate pain or headache.    [provider]  finasteride (PROSCAR) 5 MG tablet Take 1 tablet by mouth daily. 01/21/21   [provider]  glipiZIDE (GLUCOTROL XL) 5 MG 24 hr tablet Take 5 mg by mouth daily with breakfast.    [provider]  memantine (NAMENDA TITRATION PAK) tablet pack 5 mg/day for =1 week; 5 mg twice daily for =1 week; 15 mg/day given in 5 mg and 10 mg separated doses for =1 week; then 10 mg twice daily 04/03/22   Garvin Fila, MD  memantine (NAMENDA) 10 MG tablet Take 1 tablet (10 mg total) by mouth 2 (two) times daily. Patient not taking: Reported on 05/01/2022 04/03/22   Garvin Fila, MD  metFORMIN (GLUCOPHAGE) 1000 MG tablet Take 1 tablet (1,000 mg total) by mouth 2 (two) times daily with a meal. 01/11/21   Denton Brick, Courage, MD  metoprolol succinate (TOPROL XL) 25 MG 24 hr tablet Take 1 tablet (25 mg total) by mouth at bedtime. 02/09/22   Raiford Noble Latif, DO  mexiletine (MEXITIL) 250 MG capsule TAKE 1 CAPSULE(250 MG) BY MOUTH TWICE DAILY 03/31/22   Camnitz, Ocie Doyne, MD  midodrine (PROAMATINE) 2.5 MG tablet Take 1 tablet (2.5 mg total) by mouth 3 (three) times daily with meals. 04/16/22   Camnitz, Ocie Doyne, MD  Multiple Vitamin (MULTIVITAMIN WITH MINERALS) TABS tablet Take 1 tablet by mouth daily.    [provider]  nitroGLYCERIN (NITROSTAT) 0.4 MG SL tablet Place 0.4 mg under the tongue as needed. 08/26/21    [provider]  Rivaroxaban (XARELTO) 15 MG TABS tablet Take 1 tablet (15 mg total) by mouth daily with supper. 04/07/22   Camnitz, Ocie Doyne, MD  rosuvastatin (CRESTOR) 40 MG tablet Take 1 tablet (40 mg total) by mouth at bedtime. 02/09/22   Kerney Elbe, DO  tamsulosin [facility]) 0.4 MG CAPS capsule  10/29/20   [provider]    Physical Exam    Vital Signs:  Shane Welch does not have vital signs available for review today.  Given telephonic nature of communication, physical exam is limited. AAOx3. NAD. Normal affect.  Speech and respirations are unlabored.  Accessory Clinical Findings    None  Assessment & Plan    1.  Preoperative Cardiovascular Risk Assessment:  According to the Revised Cardiac Risk Index (RCRI), his Perioperative Risk of Major Cardiac Event is (%): 11. His Functional Capacity in METs is: 4.3 according to the Duke Activity Status Index (DASI). Therefore, based on ACC/AHA guidelines, patient would be  at acceptable risk for the planned procedure without further cardiovascular testing.  Patient with diagnosis of afib on Xarelto for anticoagulation.     Procedure: TURP, SUPRAPUBIC CATHETER PLACEMENT Date of procedure: 05/13/22   CHA2DS2-VASc Score = 7  This indicates a 11.2% annual risk of stroke. The patient's score is based upon: CHF History: 0 HTN History: 1 Diabetes History: 1 Stroke History: 2 Vascular Disease History: 1 Age Score: 2 Gender Score: 0   First CVA noted 12/2020, found to have afib and started on DOAC. Stopped taking his Xarelto, had TIA 02/07/22.   CrCl 34mL/min Platelet count 194K   Per office protocol, patient can hold Xarelto for 1 day prior to procedure. If longer Xarelto hold is required, will need MD input given recent TIA < 3 months ago when pt had stopped his anticoagulation.  A copy of this note will be routed to requesting surgeon.  Time:   Today, I have spent 7 minutes with the patient with telehealth  technology discussing medical history, symptoms, and management plan.     Joylene Grapes, NP  05/07/2022, 2:37 PM

## 2022-05-07 NOTE — Progress Notes (Signed)
Carelink Summary Report / Loop Recorder 

## 2022-05-15 LAB — CUP PACEART REMOTE DEVICE CHECK
Date Time Interrogation Session: 20230722230449
Implantable Pulse Generator Implant Date: 20220610

## 2022-05-19 ENCOUNTER — Ambulatory Visit (INDEPENDENT_AMBULATORY_CARE_PROVIDER_SITE_OTHER): Payer: Medicare PPO

## 2022-05-19 DIAGNOSIS — I480 Paroxysmal atrial fibrillation: Secondary | ICD-10-CM

## 2022-06-08 NOTE — Progress Notes (Unsigned)
Cardiology Office Note   Date:  06/09/2022   ID:  Shane Welch 01-27-1941, MRN [id number]  PCP:  Kaleen Mask, MD    No chief complaint on file.  CAD  Wt Readings from Last 3 Encounters:  06/09/22 194 lb 3.2 oz (88.1 kg)  04/03/22 202 lb 3.2 oz (91.7 kg)  04/01/22 202 lb 6.4 oz (91.8 kg)       History of Present Illness: Shane Welch is a 81 y.o. 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 He underwent cardiac cath and a drug-eluting stent was placed in the mid LAD.  His ejection fraction was 40-45% with anterior hypokinesis of the time.   Repeat echo in 6/17 showed normal LV function.   He had a trip to [place] a few years ago.   In 2019, he reported: "he never got his stamina back since his MI.   In 12/2020: "Short term memory issues.  Feels this is getting worse.  He wants to avoid statins and as many meds as possible."   Had a stroke in March 2022.  In 2022, "He wore a 3 day monitor that showed a minimum HR of 61 bpm, but >33% PVCs and started on Toprol, with mention of consideration to start mexiletine at his next visit."  MDT LINQ II implanted 03/29/21, cryptogenic stroke  He was hospitalized 02/07/22 - 02/09/22 for TIA, transient speech difficulties. He had gotten frustrated with all of the pills and stopped all of his medicines, including his Xarelto. During his stay he had an echo that noted a nw reduction in his LVEF to 40-45%, (report states he was in AFib with PVCs at the time of the echo.  THought to be due to ectopy.    Once home he started having urinary difficulties, and required indwelling foley.  He had TURP and suprapubic catheter in July 2023.   Fell yesterday.  Did not pass out.  Was not using his walker- has episodic leg weakness.  Having some blood in the urine.  Back on low dose Xarelto.  [facility]  Denies : Chest pain. Dizziness. Leg edema. Nitroglycerin use. Orthopnea. Palpitations. Paroxysmal  nocturnal dyspnea. Shortness of breath. Syncope.    Falls easily.     Past Medical History:  Diagnosis Date   CAD (coronary artery disease) 12/10/2015   Ant MI 2/17>>DES to LAD // Myoview 12/22: EF 37, no ischemia (int risk)   CKD (chronic kidney disease), stage III (HCC) 10/01/2021   Diabetes (HCC) 12/10/2015   Dilated aortic root (HCC) 09/06/2018   Echo 6/17: 42 mm // Echo 10/2018: 43 mm // Echocardiogram 3/22: 42 mm // Echo 12/22: 44 mm   Essential hypertension 09/03/2017   Hyperlipidemia    Ischemic cardiomyopathy    s/p Ant MI 2/17 - Echo EF 40-45 // Echo 6/17: EF 55 // Echo 10/2018: EF 50 // Echo 3/22: EF 50-55   Paroxysmal atrial fibrillation (HCC) 09/30/2021   S/p CVA in 3/22 >> ILR // AFib IDd on ILR>>anticoagulation started   PVC's (premature ventricular contractions) 09/30/2021   Monitor 9/22: PVCs 33% // Metoprolol succ started // ILR interr 11/22: PVCs 22.5% // Echo 12/22: Frequent PVCs; EF 45-50, global HK, mild concentric LVH, normal RVSF, trivial MR, trivial AI, aortic root 44 mm   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 Dx ~  2010.    Past Surgical History:  Procedure Laterality Date   CARDIAC CATHETERIZATION N/A 12/08/2015   Procedure: Left Heart Cath and Coronary Angiography;  Surgeon: Jettie Booze, MD;  Location: [place] CV LAB;  Service: Cardiovascular;  Laterality: N/A;   CARDIAC CATHETERIZATION  12/08/2015   Procedure: Coronary Stent Intervention;  Surgeon: Jettie Booze, MD;  Location: [place] CV LAB;  Service: Cardiovascular;;   CATARACT EXTRACTION W/PHACO Left 11/13/2021   Procedure: CATARACT EXTRACTION PHACO AND INTRAOCULAR LENS PLACEMENT ([place]) LEFT;  Surgeon: Leandrew Koyanagi, MD;  Location: [place];  Service: Ophthalmology;  Laterality: Left;  9.50 01:22.5   CATARACT EXTRACTION W/PHACO Right 11/27/2021   Procedure: CATARACT EXTRACTION PHACO AND  INTRAOCULAR LENS PLACEMENT ([place]) RIGHT 8.84 01:28.4;  Surgeon: Leandrew Koyanagi, MD;  Location: [place];  Service: Ophthalmology;  Laterality: Right;   CERVICAL SPINE SURGERY     a. ~ 2002.   Right Knee Surgery     a. 08/2015.     Current Outpatient Medications  Medication Sig Dispense Refill   acetaminophen (TYLENOL) 325 MG tablet Take 650 mg by mouth every 6 (six) hours as needed for mild pain, moderate pain or headache.     finasteride (PROSCAR) 5 MG tablet Take 1 tablet by mouth daily.     glipiZIDE (GLUCOTROL XL) 5 MG 24 hr tablet Take 5 mg by mouth daily with breakfast.     memantine (NAMENDA TITRATION PAK) tablet pack 5 mg/day for =1 week; 5 mg twice daily for =1 week; 15 mg/day given in 5 mg and 10 mg separated doses for =1 week; then 10 mg twice daily 49 tablet 12   memantine (NAMENDA) 10 MG tablet Take 1 tablet (10 mg total) by mouth 2 (two) times daily. 60 tablet 3   metFORMIN (GLUCOPHAGE) 1000 MG tablet Take 1 tablet (1,000 mg total) by mouth 2 (two) times daily with a meal. 60 tablet 3   metoprolol succinate (TOPROL XL) 25 MG 24 hr tablet Take 1 tablet (25 mg total) by mouth at bedtime. 30 tablet 0   mexiletine (MEXITIL) 250 MG capsule TAKE 1 CAPSULE(250 MG) BY MOUTH TWICE DAILY 60 capsule 9   midodrine (PROAMATINE) 2.5 MG tablet Take 1 tablet (2.5 mg total) by mouth 3 (three) times daily with meals. 270 tablet 3   nitroGLYCERIN (NITROSTAT) 0.4 MG SL tablet Place 0.4 mg under the tongue as needed.     Rivaroxaban (XARELTO) 15 MG TABS tablet Take 1 tablet (15 mg total) by mouth daily with supper. 30 tablet 5   rosuvastatin (CRESTOR) 40 MG tablet Take 1 tablet (40 mg total) by mouth at bedtime. 30 tablet 0   tamsulosin (FLOMAX) 0.4 MG CAPS capsule 0.4 mg 2 (two) times daily.     No current facility-administered medications for this visit.    Allergies:   Sulfa antibiotics    Social History:  The patient  reports that he has quit smoking. His smoking use  included cigarettes. He has a 30.00 pack-year smoking history. He has never used smokeless tobacco. He reports current alcohol use. He reports that he does not use drugs.   Family History:  The patient's family history includes Cancer in his father; Dementia in his mother.    ROS:  Please see the history of present illness.   Otherwise, review of systems are positive for falls easily.   All other systems are reviewed and negative.    PHYSICAL EXAM: VS:  BP 104/66  Pulse 84   Ht 5\' 11"  (1.803 m)   Wt 194 lb 3.2 oz (88.1 kg)   SpO2 98%   BMI 27.09 kg/m  , BMI Body mass index is 27.09 kg/m. GEN: Well nourished, well developed, in no acute distress HEENT: ear stitched up after fall yesterday Neck: no JVD, carotid bruits, or masses Cardiac: RRR; no murmurs, rubs, or gallops,no edema  Respiratory:  clear to auscultation bilaterally, normal work of breathing GI: soft, nontender, nondistended, + BS MS: no deformity or atrophy Skin: warm and dry, no rash Neuro:  Strength and sensation are intact Psych: euthymic mood, full affect   EKG:   The ekg ordered June 2023 demonstrates NSR   Recent Labs: 02/09/2022: ALT 16; BUN 19; Creatinine, Ser 1.62; Hemoglobin 14.8; Magnesium 1.8; Platelets 197; Potassium 4.0; Sodium 141   Lipid Panel    Component Value Date/Time   CHOL 180 02/08/2022 0144   CHOL 102 10/07/2021 1024   TRIG 165 (H) 02/08/2022 0144   HDL 28 (L) 02/08/2022 0144   HDL 42 10/07/2021 1024   CHOLHDL 6.4 02/08/2022 0144   VLDL 33 02/08/2022 0144   LDLCALC 119 (H) 02/08/2022 0144   LDLCALC 43 10/07/2021 1024     Other studies Reviewed: Additional studies/ records that were reviewed today with results demonstrating: labs reviewed.   ASSESSMENT AND PLAN:  CAD/Old MI: No angina on medical therapy. Biggest issue is leg weakness.  HHPT would be helpful.  I stressed the importance of using the walker.  PAF:  CHADS- vasc elevated.  Low dose Xarelto.  PVCs: ILR in place  after stroke.  No PVC on exam today.  Hyperlipidemia: LDL 119.  Continue Crestor.  Would not add meds now given overall situation.   DM: A1c 6.6 in April 2023.  Well-controlled.  Eating regularly per the wife.  Former smoker: quit years ago   Current medicines are reviewed at length with the patient today.  The patient concerns regarding his medicines were addressed.  The following changes have been made:  No change  Labs/ tests ordered today include:  No orders of the defined types were placed in this encounter.   Recommend 150 minutes/week of aerobic exercise Low fat, low carb, high fiber diet recommended  Disposition:   FU in 1 year   Signed, May 2023, MD  06/09/2022 10:09 AM    [facility] [address] [place], [place], [place]  [place] Phone: [phone]; Fax: [phone]

## 2022-06-09 ENCOUNTER — Encounter: Payer: Self-pay | Admitting: Interventional Cardiology

## 2022-06-09 ENCOUNTER — Ambulatory Visit: Payer: Medicare PPO | Admitting: Interventional Cardiology

## 2022-06-09 VITALS — BP 104/66 | HR 84 | Ht 71.0 in | Wt 194.2 lb

## 2022-06-09 DIAGNOSIS — I493 Ventricular premature depolarization: Secondary | ICD-10-CM

## 2022-06-09 DIAGNOSIS — I480 Paroxysmal atrial fibrillation: Secondary | ICD-10-CM

## 2022-06-09 DIAGNOSIS — E782 Mixed hyperlipidemia: Secondary | ICD-10-CM

## 2022-06-09 DIAGNOSIS — E1159 Type 2 diabetes mellitus with other circulatory complications: Secondary | ICD-10-CM

## 2022-06-09 DIAGNOSIS — I25118 Atherosclerotic heart disease of native coronary artery with other forms of angina pectoris: Secondary | ICD-10-CM | POA: Diagnosis not present

## 2022-06-09 NOTE — Patient Instructions (Signed)
Medication Instructions:  Your physician recommends that you continue on your current medications as directed. Please refer to the Current Medication list given to you today.  *If you need a refill on your cardiac medications before your next appointment, please call your pharmacy*   Lab Work: None If you have labs (blood work) drawn today and your tests are completely normal, you will receive your results only by: MyChart Message (if you have MyChart) OR A paper copy in the mail If you have any lab test that is abnormal or we need to change your treatment, we will call you to review the results.   Follow-Up: At [facility], you and your health needs are our priority.  As part of our continuing mission to provide you with exceptional heart care, we have created designated Provider Care Teams.  These Care Teams include your primary Cardiologist (physician) and Advanced Practice Providers (APPs -  Physician Assistants and Nurse Practitioners) who all work together to provide you with the care you need, when you need it.  We recommend signing up for the patient portal called "MyChart".  Sign up information is provided on this After Visit Summary.  MyChart is used to connect with patients for Virtual Visits (Telemedicine).  Patients are able to view lab/test results, encounter notes, upcoming appointments, etc.  Non-urgent messages can be sent to your provider as well.   To learn more about what you can do with MyChart, go to [email or web address].    Your next appointment:   1 year(s)  The format for your next appointment:   In Person  Provider:   Lance Muss, MD {   Important Information About Sugar

## 2022-06-20 NOTE — Progress Notes (Signed)
Carelink Summary Report / Loop Recorder 

## 2022-06-24 ENCOUNTER — Ambulatory Visit (INDEPENDENT_AMBULATORY_CARE_PROVIDER_SITE_OTHER): Payer: Medicare PPO

## 2022-06-24 DIAGNOSIS — I639 Cerebral infarction, unspecified: Secondary | ICD-10-CM

## 2022-06-24 LAB — CUP PACEART REMOTE DEVICE CHECK
Date Time Interrogation Session: 20230905114749
Implantable Pulse Generator Implant Date: 20220610

## 2022-07-14 NOTE — Progress Notes (Signed)
Carelink Summary Report / Loop Recorder 

## 2022-07-23 LAB — CUP PACEART REMOTE DEVICE CHECK
Date Time Interrogation Session: 20231002231543
Implantable Pulse Generator Implant Date: 20220610

## 2022-07-27 ENCOUNTER — Other Ambulatory Visit: Payer: Self-pay | Admitting: Student

## 2022-07-28 ENCOUNTER — Ambulatory Visit (INDEPENDENT_AMBULATORY_CARE_PROVIDER_SITE_OTHER): Payer: Medicare PPO

## 2022-07-28 DIAGNOSIS — I639 Cerebral infarction, unspecified: Secondary | ICD-10-CM

## 2022-07-29 IMAGING — MR MR HEAD W/O CM
6 of 11 series · 24 of 48 positions shown · non-contrast
Comparison: CTA head neck from yesterday

CLINICAL DATA: Neuro deficit with acute stroke suspected

EXAM:
MRI HEAD WITHOUT CONTRAST
TECHNIQUE: Multiplanar, multiecho pulse sequences of the brain and surrounding
structures were obtained without intravenous contrast.

[Series 2: DWI · axial · 3.0mm · 0.94mm/px · z∈[-52,+101]mm · 7 of 104 slices shown (1 of 2)]
[im 1/104]
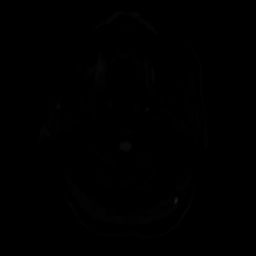
[im 18/104]
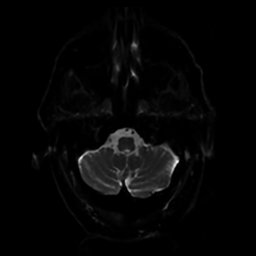
[im 35/104]
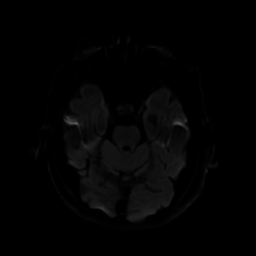
[im 52/104]
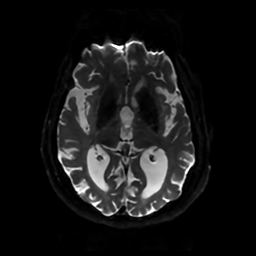
[im 69/104]
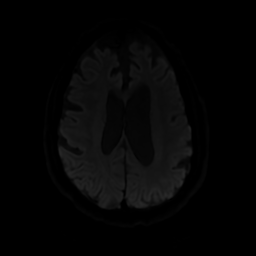
[im 86/104]
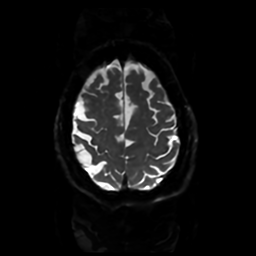
[im 104/104]
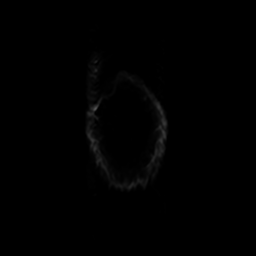

[Series 3: DWI · coronal · 4.0mm · 0.94mm/px · 5 of 75 slices shown (2 of 2)]
[im 1/75]
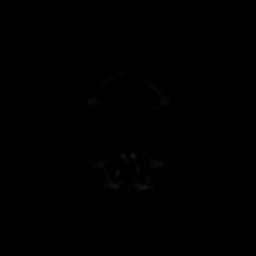
[im 19/75]
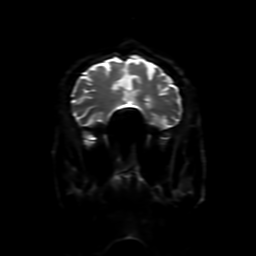
[im 38/75]
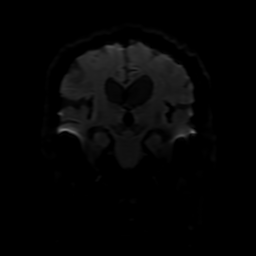
[im 56/75]
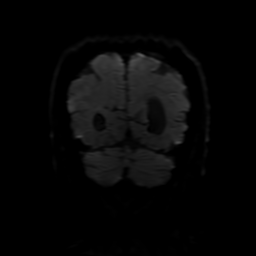
[im 75/75]
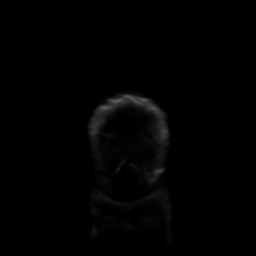

[Series 4: FLAIR · sagittal · 5.0mm · 0.23mm/px · 2 of 24 slices shown (1 of 2)]
[im 1/24]
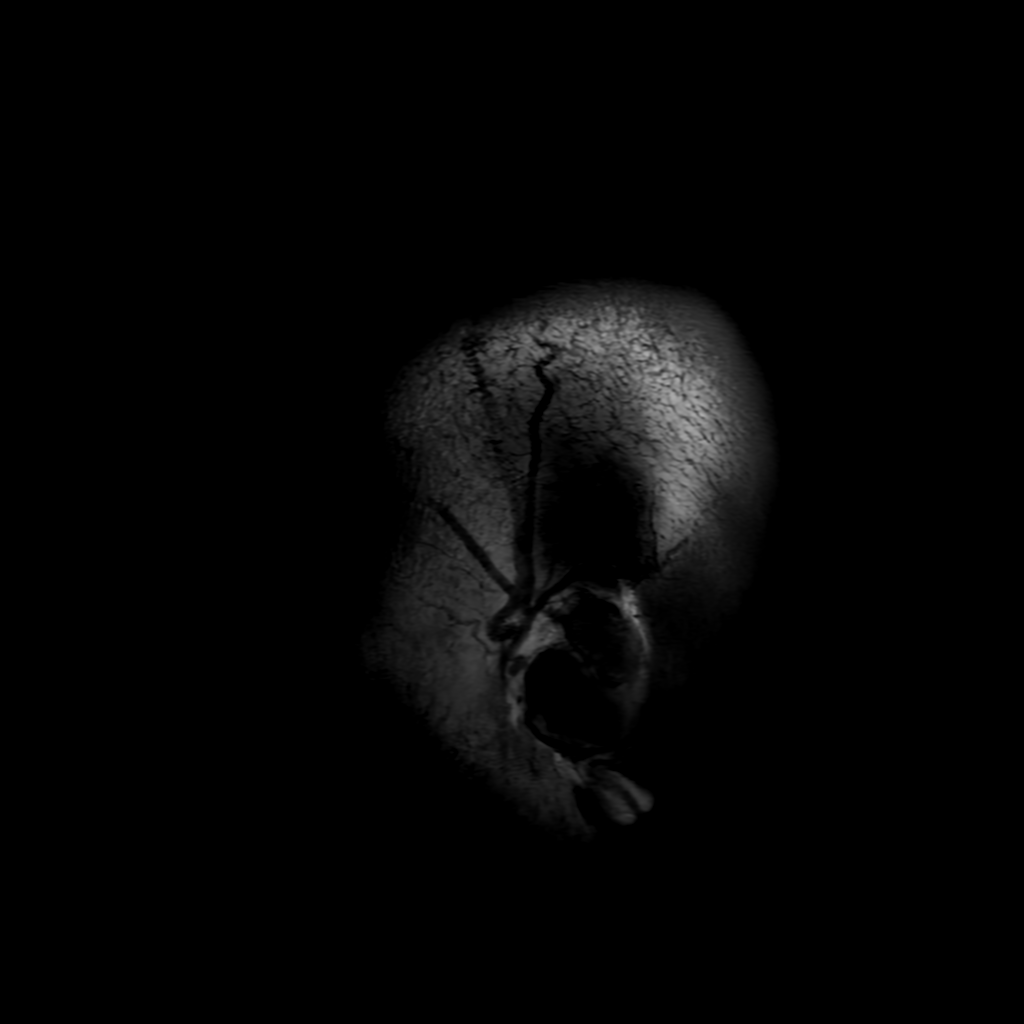
[im 24/24]
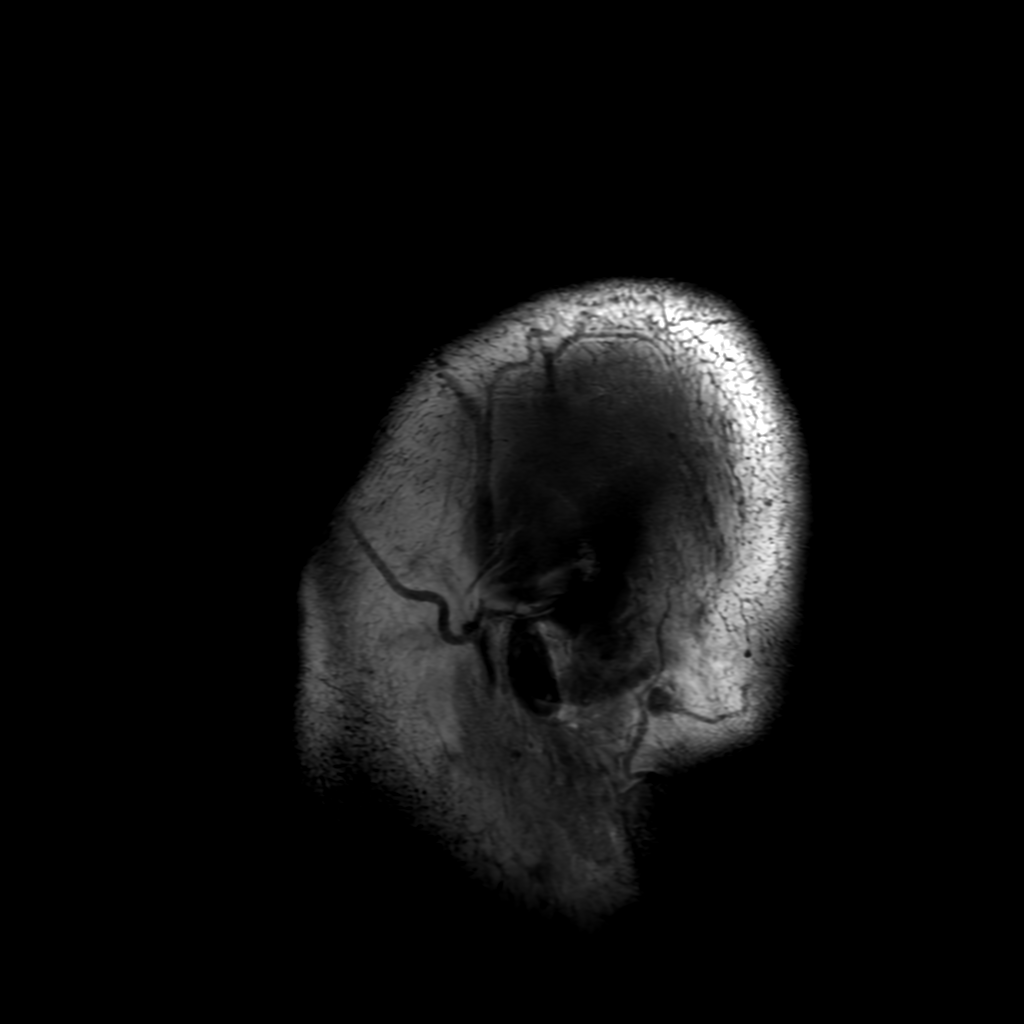

[Series 6: FLAIR · axial · 4.0mm · 0.45mm/px · z∈[-57,+101]mm · 3 of 37 slices shown (2 of 2)]
[im 1/37]
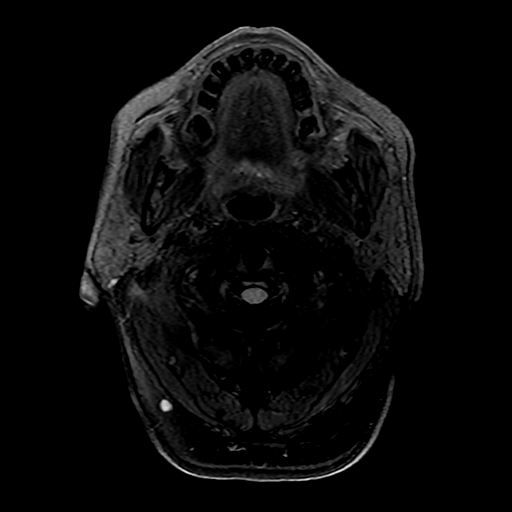
[im 19/37]
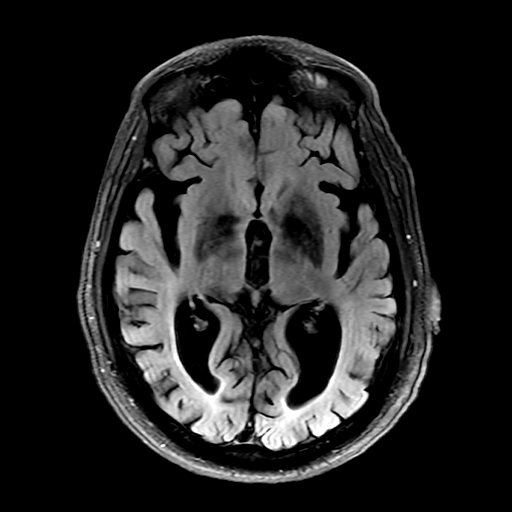
[im 37/37]
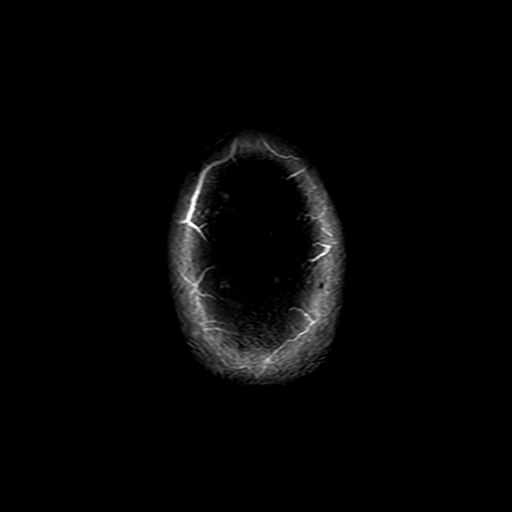

[Series 250: ADC · axial · 3.0mm · 0.94mm/px · z∈[-52,+101]mm · 4 of 52 slices shown (1 of 2)]
[im 1/52]
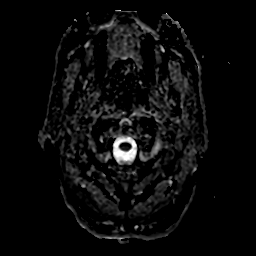
[im 18/52]
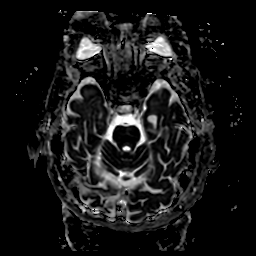
[im 35/52]
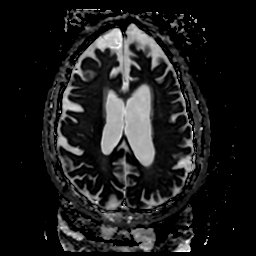
[im 52/52]
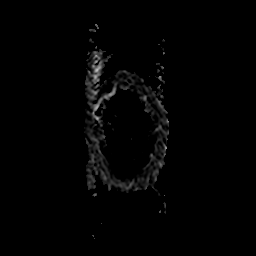

[Series 350: ADC · coronal · 4.0mm · 0.94mm/px · 3 of 38 slices shown (2 of 2)]
[im 1/38]
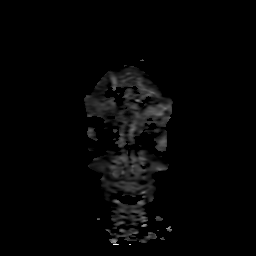
[im 19/38]
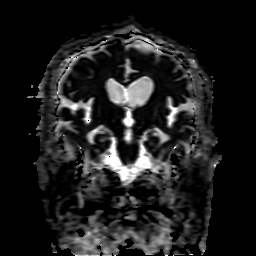
[im 38/38]
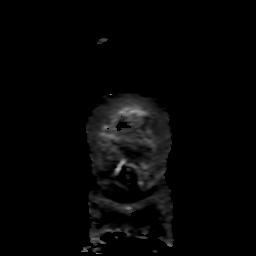

[24 of 48 positions shown; findings below may reference images not displayed]

FINDINGS: Brain: Small area of diffusion hyperintensity in the left centrum
semiovale is shine through based on the ADC map. No acute infarct.
No hydrocephalus or collection. Small to moderate remote right
occipital cortex infarct. Generalized brain atrophy with
ventriculomegaly. Area of appear CSF signal and mild cortical mass
effect at the superior left frontal convexity, compatible with
arachnoid cyst and measuring 4.2 cm. Mild periventricular chronic
small vessel ischemia.

Vascular: Major flow voids are preserved

Skull and upper cervical spine: Normal marrow signal

Sinuses/Orbits: Bilateral cataract resection.
IMPRESSION: 1. No acute finding.
2. Generalized brain atrophy.
3. Remote right occipital cortex infarct.

## 2022-07-30 ENCOUNTER — Other Ambulatory Visit: Payer: Self-pay | Admitting: Neurology

## 2022-08-06 ENCOUNTER — Ambulatory Visit: Payer: Medicare PPO | Admitting: Neurology

## 2022-08-06 VITALS — BP 142/81 | HR 81 | Ht 71.0 in | Wt 196.0 lb

## 2022-08-06 DIAGNOSIS — F01A0 Vascular dementia, mild, without behavioral disturbance, psychotic disturbance, mood disturbance, and anxiety: Secondary | ICD-10-CM | POA: Diagnosis not present

## 2022-08-06 DIAGNOSIS — Z8673 Personal history of transient ischemic attack (TIA), and cerebral infarction without residual deficits: Secondary | ICD-10-CM

## 2022-08-06 MED ORDER — MEMANTINE HCL 10 MG PO TABS: 10.0000 mg | ORAL_TABLET | Freq: Two times a day (BID) | ORAL | 3 refills | Status: DC

## 2022-08-06 NOTE — Progress Notes (Signed)
Carelink Summary Report / Loop Recorder 

## 2022-08-06 NOTE — Progress Notes (Signed)
[facility] [address] [place]. [place] [address] [phone]       OFFICE FOLLOW UP VISIT NOTE  [name] Date of Birth:  Jun 23, 1941 Medical Record Number:  [id number]   Referring MD: Windle Guard Reason for Referral: Stroke HPI: Initial visit 02/19/2021: [name] is a pleasant 81 Caucasian male seen today for initial office consultation visit for stroke.  He is accompanied by his wife [name] and history is obtained from them and review of electronic medical records and I personally reviewed available imaging films in PACS.  He has past medical history of diabetes, hypertension, hyperlipidemia and coronary artery disease.  He was seen on 01/07/2021 at [facility].  He was sitting on a stool in his kitchen table when if fell down.  He is unable to get up and was off balance.  He was also confused and disoriented.  He stated he was seeing black spots in front of both eyes.  He was brought in by EMS and was quite agitated when he came in.  Patient states he has no memory of his 5-day hospitalization.  He was seen on arrival by telemetry neurologist who thought he had a TIA and NIH stroke scale on admission was 0.  CT scan of the head on admission was unremarkable and subsequent hospital admission and MRI and further work-up was suggested.  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  2D echo showed normal ejection fraction without cardiac source of embolism.  LDL cholesterol was 1 1 3  mg percent.  Hemoglobin A1c was 6.8.  Urine drug screen was negative.  Patient was discharged on aspirin and Plavix for 3 weeks and subsequently Plavix alone.  Patient states he still sees occasional black spots in front of his eyes but his vision is mostly  improved.  He is also improved in his coordination and balance.  He however has noticed worsening of his baseline memory and cognitive difficulties which he was actually having for the last 5 6 years ago and before his stroke.  He also complains of feeling tired and having low energy.  Patient was started on Lipitor but his primary care physician has changed him to Crestor because of urinary retention.  Patient does have family history of Alzheimer's in his mother and worries about it.  He is tolerating Plavix well without bruising or bleeding.  His blood pressure is well controlled today slightly elevated in office at 146/95.  He was having some orthostatic dizziness but he has been started on midodrine which seems to have helped.  He has no prior history of strokes TIA seizures prior neurological problems.  He denies any history of atrial fibrillation, cardiac arrhythmias but does have history of coronary artery disease and is undergone stenting by Dr. Eldridge Dace. Update 07/09/2021 : He returns for follow-up after last visit 81 months ago.  He is accompanied by his wife.  She states that his memory is not better.  He continues to have short-term memory difficulties and trouble remembering recent information.  He struggles with calculations and with numbers.  He still mostly independent but wife has to keep a close watch on him.  He denies any symptoms of delusion, hallucinations, unsafe behavior.  His gait and balance are good with no  falls or injuries.  He has been having some bradycardia and has an appointment upcoming to see cardiologist Dr. Elberta Fortis for this.  He has finished working with physical therapy for his balance.  He had lab work for reversible causes of memory loss on 02/19/2021 all of which were normal.  EEG on 03/08/2019 showed mild generalized slowing without epileptiform activity.  He had a loop recorder inserted and so for paroxysmal A. fib has not yet been found.  On Mini-Mental status exam testing  today scored 24/30.  He has s was not found to be depressed on geriatric depression scale.   Update 11/11/2021: He returns for follow-up after last visit 81 months ago.  He is accompanied by his wife.  He states he noticed improvement in his cognitive difficulties after starting Aricept and was tolerating 10 mg daily quite well but then after the first few weeks of improvement his benefit plateaued out.  He however started having some nasal bleeding and he felt this may have been related to Aricept so he reduced the dose to 5 mg.  However his cardiologist recently switched Eliquis to Xarelto last Friday and so far is tolerated well without any recurrence of nasal bleeding.  Patient has not had any recurrent stroke or TIA symptoms.  He had lipid profile on 10/07/2021 with LDL cholesterol being optimal at 43 mg percent.  On Mini-Mental status exam today scored 24/30 on recall 1/3 and able to name 9 animals which can walk on 4 legs.  On the clock drawing he scored 3/4.  This appears unchanged compared to last visit Update 04/03/2022 : He returns for follow-up after last visit for an months ago.  He is accompanied by his wife who provides most of the history.  Patient was admitted on 02/07/2022 to [facility] with sudden onset of speech difficulties.  He had difficult time finding the right words to use and family noted that he was struggling to communicate.  Symptoms improved after reaching the emergency room and hence was not felt to be a candidate for thrombolysis.  Patient had a history of atrial fibrillation and previously been on Eliquis but was discontinued due to nasal bleed.  He was switched to Xarelto but patient electively stopped taking it several months ago.  He also stopped his most of his medications except for diabetes medicine.  CT head was negative for acute process.  Lab work was significant for elevated creatinine of 1.8.  MRI scan of the brain showed no acute infarct.  Echocardiogram showed  decreased ejection fraction of 40 to 45% with mild global hypokinesis.  LDL cholesterol is elevated at 119 mg percent and hemoglobin A1c was 6.6.  Patient was counseled to be compliant with all his medications and restarted on anticoagulation with Xarelto.  Patient has been pain at home but he has been having a urinary problem and has a indwelling urinary catheter.  They are waiting for follow-up appointment with [facility] to see if catheter can come out.  Patient is able to walk with a cane but is unsteady and his wife has to walk close behind him.  He has had 2 falls in the last 3 weeks.  Patient states that when he walks his feet move faster and he has trouble and slowing himself down.  Continues to have memory difficulties and cognitive impairment.  He is tolerating Aricept in the past.  He was offered Namenda but declined in the past but states now he is willing  to try it.  He does get some occasional visual hallucinations and does get disoriented from time to time.  He scored 22/30 on Mini-Mental testing today which is slight decline from 24/30 at last visit. Update 08/06/2022 ; he returns for follow-up after last visit 81 months ago.  He is accompanied by his wife.  They both feel he is doing a lot better after starting Namenda which she is tolerating well without side effects.  He can now dress himself as well as pentoxifylline memory is improved.  The wife in fact feels that his gait instability is also better than his abnormal falls.  Patient denies any delusions, hallucinations, unsafe behavior agitation.  He does state quite a bit.  He remains on Xarelto which is tolerating well without bleeding or bruising.  His sugars are doing well and blood pressures under good control.  He has no new complaints.  On Mini-Mental status exam today scored 21/30 which is unchanged from last visit. ROS:   14 system review of systems is positive for nasal bleeding,, headache, calculation difficonfusion,  disorientation, memory loss, decreased energy, fatigue, falls, walking difficulty, urinary retention, generalized weakness.tiredness all other systems negative  PMH:  Past Medical History:  Diagnosis Date   CAD (coronary artery disease) 12/10/2015   Ant MI 2/17>>DES to LAD // Myoview 12/22: EF 37, no ischemia (int risk)   CKD (chronic kidney disease), stage III ([place]) 10/01/2021   Diabetes ([place]) 12/10/2015   Dilated aortic root ([place]) 09/06/2018   Echo 6/17: 42 mm // Echo 10/2018: 43 mm // Echocardiogram 3/22: 42 mm // Echo 12/22: 44 mm   Essential hypertension 09/03/2017   Hyperlipidemia    Ischemic cardiomyopathy    s/p Ant MI 2/17 - Echo EF 40-45 // Echo 6/17: EF 55 // Echo 10/2018: EF 50 // Echo 3/22: EF 50-55   Paroxysmal atrial fibrillation ([place]) 09/30/2021   S/p CVA in 3/22 >> ILR // AFib IDd on ILR>>anticoagulation started   PVC's (premature ventricular contractions) 09/30/2021   Monitor 9/22: PVCs 33% // Metoprolol succ started // ILR interr 11/22: PVCs 22.5% // Echo 12/22: Frequent PVCs; EF 45-50, global HK, mild concentric LVH, normal RVSF, trivial MR, trivial AI, aortic root 44 mm   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 Dx ~ 2010.    Social History:  Social History   Socioeconomic History   Marital status: Married    Spouse name: Vaughan Basta   Number of children: Not on file   Years of education: Not on file   Highest education level: Not on file  Occupational History   Not on file  Tobacco Use   Smoking status: Former    Packs/day: 1.00    Years: 30.00    Total pack years: 30.00    Types: Cigarettes   Smokeless tobacco: Never   Tobacco comments:    quit 1993  Vaping Use   Vaping Use: Never used  Substance and Sexual Activity   Alcohol use: Yes    Alcohol/week: 0.0 standard drinks of alcohol    Comment: rare drink   Drug use: No   Sexual activity: Not on file  Other Topics Concern    Not on file  Social History Narrative   Lives with wife and son   Right Handed   Drinks 2-3 cups caffeine daily   Social Determinants of Health   Financial  Resource Strain: Not on file  Food Insecurity: Not on file  Transportation Needs: Not on file  Physical Activity: Not on file  Stress: Not on file  Social Connections: Not on file  Intimate Partner Violence: Not on file    Medications:   Current Outpatient Medications on File Prior to Visit  Medication Sig Dispense Refill   acetaminophen (TYLENOL) 325 MG tablet Take 650 mg by mouth every 6 (six) hours as needed for mild pain, moderate pain or headache.     glipiZIDE (GLUCOTROL XL) 5 MG 24 hr tablet Take 5 mg by mouth daily with breakfast.     metFORMIN (GLUCOPHAGE) 1000 MG tablet Take 1 tablet (1,000 mg total) by mouth 2 (two) times daily with a meal. 60 tablet 3   metoprolol succinate (TOPROL XL) 25 MG 24 hr tablet Take 1 tablet (25 mg total) by mouth at bedtime. 30 tablet 0   mexiletine (MEXITIL) 250 MG capsule TAKE 1 CAPSULE(250 MG) BY MOUTH TWICE DAILY 60 capsule 9   midodrine (PROAMATINE) 2.5 MG tablet Take 1 tablet (2.5 mg total) by mouth 3 (three) times daily with meals. 270 tablet 3   nitroGLYCERIN (NITROSTAT) 0.4 MG SL tablet Place 0.4 mg under the tongue as needed.     Rivaroxaban (XARELTO) 15 MG TABS tablet Take 1 tablet (15 mg total) by mouth daily with supper. 30 tablet 5   rosuvastatin (CRESTOR) 40 MG tablet Take 1 tablet (40 mg total) by mouth at bedtime. 30 tablet 0   No current facility-administered medications on file prior to visit.    Allergies:   Allergies  Allergen Reactions   Sulfa Antibiotics Hives    Physical Exam General: well developed, well nourished elderly Caucasian male, seated, in no evident distress Head: head normocephalic and atraumatic.   Neck: supple with no carotid or supraclavicular bruits Cardiovascular: regular rate and rhythm, no murmurs Musculoskeletal: no deformity Skin:  no  rash/petichiae Vascular:  Normal pulses all extremities  Neurologic Exam Mental Status: Awake and fully alert. Oriented to place and time. Recent and remote memory diminished. Attention span, concentration and fund of knowledge appropriate. Mood and affect appropriate.  Recall 1/3.  Able to name 9 animals which can walk on 4 legs.  Clock drawing 3/4.  Mini-Mental status exam score 2130.  Geriatric depression scale not done. Cranial Nerves: Fundoscopic exam not done . Pupils equal, briskly reactive to light. Extraocular movements full without nystagmus. Visual fields full to confrontation. Hearing diminished bilaterally. Facial sensation intact. Face, tongue, palate moves normally and symmetrically.  Motor: Normal bulk and tone. Normal strength in all tested extremity muscles. Sensory.: intact to touch , pinprick , position and vibratory sensation.  Coordination: Rapid alternating movements normal in all extremities. Finger-to-nose and heel-to-shin performed accurately bilaterally. Gait and Station: Arises from chair without difficulty. Stance is normal.  Uses a cane.  Gait demonstrates slight wide-based and imbalance while turning..  Not able to heel, toe and tandem walk .  Reflexes: 1+ and symmetric except both ankle jerks are depressed slightly.. Toes downgoing.       08/06/2022    1:13 PM 04/03/2022    2:08 PM 11/11/2021    3:04 PM  MMSE - Mini Mental State Exam  Orientation to time 3 4 5   Orientation to Place 5 4 5   Registration 3 3 3   Attention/ Calculation 1 3 2   Recall 1 0 1  Language- name 2 objects 2 2 2   Language- repeat 1 1 1   Language-  follow 3 step command 3 3 3   Language- read & follow direction 1 1 1   Write a sentence 1 1 1   Copy design 0 0 0  Total score 21 22 24        08/06/2022    1:13 PM 04/03/2022    2:08 PM 11/11/2021    3:04 PM  MMSE - Mini Mental State Exam  Orientation to time 3 4 5   Orientation to Place 5 4 5   Registration 3 3 3   Attention/ Calculation 1  3 2   Recall 1 0 1  Language- name 2 objects 2 2 2   Language- repeat 1 1 1   Language- follow 3 step command 3 3 3   Language- read & follow direction 1 1 1   Write a sentence 1 1 1   Copy design 0 0 0  Total score 21 22 24      ASSESSMENT: 81 year old Caucasian male with 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  Vascular risk factors of coronary artery disease, diabetes, hypertension, hyperlipidemia.  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  Recent episode of left hemispheric TIA due to noncompliance with anticoagulation for A-fib.     PLAN:  I had a long discussion with the patient and his wife regarding his memory loss and mild vascular dementia which seems stable on current dose of Namenda which is tolerating well without side effects.  Recommend he continue Namenda 10 mg twice daily and increase participation in cognitively challenging activities like solving crossword puzzles, playing bridge and sudoku.  We also discussed memory compensation strategies.  Continue Xarelto for stroke prevention for his A-fib and maintain aggressive risk factor modification.  Return for follow-up in the future in 6 months with my nurse practitioner or call earlier if necessary.Greater than 50% time during this 35-minute  visit was spent on counseling and coordination of care about his embolic occipital stroke,TIA, mild dementia as well as discussion about mild cognitive impairment and answering questions. Antony Contras, MD Note: This document was prepared with digital dictation and possible smart phrase technology. Any transcriptional errors that result from this process are unintentional.

## 2022-08-06 NOTE — Patient Instructions (Addendum)
I had a long discussion with the patient and his wife regarding his memory loss and mild vascular dementia which seems stable on current dose of Namenda which is tolerating well without side effects.  Recommend he continue Namenda 10 mg twice daily and increase participation in cognitively challenging activities like solving crossword puzzles, playing bridge and sudoku.  We also discussed memory compensation strategies.  Continue Xarelto for stroke prevention for his A-fib and maintain aggressive risk factor modification.  Return for follow-up in the future in 6 months with my nurse practitioner or call earlier if necessary.  Memory Compensation Strategies  Use "WARM" strategy.  W= write it down  A= associate it  R= repeat it  M= make a mental note  2.   You can keep a Social worker.  Use a 3-ring notebook with sections for the following: calendar, important names and phone numbers,  medications, doctors' names/phone numbers, lists/reminders, and a section to journal what you did  each day.   3.    Use a calendar to write appointments down.  4.    Write yourself a schedule for the day.  This can be placed on the calendar or in a separate section of the Memory Notebook.  Keeping a  regular schedule can help memory.  5.    Use medication organizer with sections for each day or morning/evening pills.  You may need help loading it  6.    Keep a basket, or pegboard by the door.  Place items that you need to take out with you in the basket or on the pegboard.  You may also want to  include a message board for reminders.  7.    Use sticky notes.  Place sticky notes with reminders in a place where the task is performed.  For example: " turn off the  stove" placed by the stove, "lock the door" placed on the door at eye level, " take your medications" on  the bathroom mirror or by the place where you normally take your medications.  8.    Use alarms/timers.  Use while cooking to remind yourself  to check on food or as a reminder to take your medicine, or as a  reminder to make a call, or as a reminder to perform another task, etc. .

## 2022-08-30 LAB — CUP PACEART REMOTE DEVICE CHECK
Date Time Interrogation Session: 20231104231225
Implantable Pulse Generator Implant Date: 20220610

## 2022-09-01 ENCOUNTER — Ambulatory Visit (INDEPENDENT_AMBULATORY_CARE_PROVIDER_SITE_OTHER): Payer: Medicare PPO

## 2022-09-01 DIAGNOSIS — I639 Cerebral infarction, unspecified: Secondary | ICD-10-CM | POA: Diagnosis not present

## 2022-09-03 ENCOUNTER — Other Ambulatory Visit: Payer: Self-pay | Admitting: Cardiology

## 2022-09-03 DIAGNOSIS — I480 Paroxysmal atrial fibrillation: Secondary | ICD-10-CM

## 2022-09-03 NOTE — Telephone Encounter (Signed)
Xarelto 15mg  refill request received. Pt is 81 years old, weight-88.9kg, Crea-1.62 on 02/09/2022, last seen by Dr. 02/11/2022 on 06/09/2022, Diagnosis-Afib, CrCl-45.73 mL/min; Dose is appropriate based on dosing criteria. Will send in refill to requested pharmacy.

## 2022-09-30 ENCOUNTER — Ambulatory Visit: Payer: Medicare PPO | Attending: Cardiology | Admitting: Cardiology

## 2022-09-30 ENCOUNTER — Encounter: Payer: Self-pay | Admitting: Cardiology

## 2022-09-30 VITALS — BP 124/80 | HR 70 | Ht 71.0 in | Wt 190.6 lb

## 2022-09-30 DIAGNOSIS — D6869 Other thrombophilia: Secondary | ICD-10-CM

## 2022-09-30 DIAGNOSIS — I493 Ventricular premature depolarization: Secondary | ICD-10-CM

## 2022-09-30 DIAGNOSIS — I639 Cerebral infarction, unspecified: Secondary | ICD-10-CM | POA: Diagnosis not present

## 2022-09-30 DIAGNOSIS — I480 Paroxysmal atrial fibrillation: Secondary | ICD-10-CM | POA: Diagnosis not present

## 2022-09-30 DIAGNOSIS — I2510 Atherosclerotic heart disease of native coronary artery without angina pectoris: Secondary | ICD-10-CM | POA: Diagnosis not present

## 2022-09-30 NOTE — Progress Notes (Signed)
Electrophysiology Office Note   Date:  09/30/2022   ID:  Shane Welch, DOB 03-23-1941, MRN [id number]  PCP:  Shane Mask, MD  Cardiologist:   Primary Electrophysiologist:  Shane Stephen Jorja Loa, MD    Chief Complaint: CVA   History of Present Illness: Shane Welch is a 81 y.o. male who is being seen today for the evaluation of CVA at the request of Shane Welch, *. Presenting today for electrophysiology evaluation.  History seen for type 2 diabetes, hypertension, hyperlipidemia, coronary artery disease.  He presented to the emergency room with a fall and was found to have an acute right occipital lobe stroke.  He had an ILR implanted.  He was found to have atrial fibrillation.  He is now on Xarelto.  He had an elevated PVC burden and has since been started on mexiletine.  Today, denies symptoms of palpitations, chest pain, shortness of breath, orthopnea, PND, lower extremity edema, claudication, dizziness, presyncope, syncope, bleeding, or neurologic sequela. The patient is tolerating medications without difficulties.  His main complaint today is fatigue.  He is able to do most of his daily activities but has to rest quite often.  He has been fatigued since his stroke.   Past Medical History:  Diagnosis Date   CAD (coronary artery disease) 12/10/2015   Ant MI 2/17>>DES to LAD // Myoview 12/22: EF 37, no ischemia (int risk)   CKD (chronic kidney disease), stage III (HCC) 10/01/2021   Diabetes (HCC) 12/10/2015   Dilated aortic root (HCC) 09/06/2018   Echo 6/17: 42 mm // Echo 10/2018: 43 mm // Echocardiogram 3/22: 42 mm // Echo 12/22: 44 mm   Essential hypertension 09/03/2017   Hyperlipidemia    Ischemic cardiomyopathy    s/p Ant MI 2/17 - Echo EF 40-45 // Echo 6/17: EF 55 // Echo 10/2018: EF 50 // Echo 3/22: EF 50-55   Paroxysmal atrial fibrillation (HCC) 09/30/2021   S/p CVA in 3/22 >> ILR // AFib IDd on ILR>>anticoagulation started   PVC's (premature ventricular  contractions) 09/30/2021   Monitor 9/22: PVCs 33% // Metoprolol succ started // ILR interr 11/22: PVCs 22.5% // Echo 12/22: Frequent PVCs; EF 45-50, global HK, mild concentric LVH, normal RVSF, trivial MR, trivial AI, aortic root 44 mm   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 Dx ~ 2010.   Past Surgical History:  Procedure Laterality Date   CARDIAC CATHETERIZATION N/A 12/08/2015   Procedure: Left Heart Cath and Coronary Angiography;  Surgeon: Corky Crafts, MD;  Location: [facility] INVASIVE CV LAB;  Service: Cardiovascular;  Laterality: N/A;   CARDIAC CATHETERIZATION  12/08/2015   Procedure: Coronary Stent Intervention;  Surgeon: Corky Crafts, MD;  Location: [facility] INVASIVE CV LAB;  Service: Cardiovascular;;   CATARACT EXTRACTION W/PHACO Left 11/13/2021   Procedure: CATARACT EXTRACTION PHACO AND INTRAOCULAR LENS PLACEMENT (IOC) LEFT;  Surgeon: Lockie Mola, MD;  Location: [facility];  Service: Ophthalmology;  Laterality: Left;  9.50 01:22.5   CATARACT EXTRACTION W/PHACO Right 11/27/2021   Procedure: CATARACT EXTRACTION PHACO AND INTRAOCULAR LENS PLACEMENT (IOC) RIGHT 8.84 01:28.4;  Surgeon: Lockie Mola, MD;  Location: [facility];  Service: Ophthalmology;  Laterality: Right;   CERVICAL SPINE SURGERY     a. ~ 2002.   Right Knee Surgery     a. 08/2015.     Current Outpatient Medications  Medication Sig Dispense Refill  acetaminophen (TYLENOL) 325 MG tablet Take 650 mg by mouth every 6 (six) hours as needed for mild pain, moderate pain or headache.     glipiZIDE (GLUCOTROL XL) 5 MG 24 hr tablet Take 5 mg by mouth daily with breakfast.     memantine (NAMENDA) 10 MG tablet Take 1 tablet (10 mg total) by mouth 2 (two) times daily. 180 tablet 3   metFORMIN (GLUCOPHAGE) 1000 MG tablet Take 1 tablet (1,000 mg total) by mouth 2 (two) times daily with a meal. 60 tablet 3   metoprolol  succinate (TOPROL XL) 25 MG 24 hr tablet Take 1 tablet (25 mg total) by mouth at bedtime. 30 tablet 0   mexiletine (MEXITIL) 250 MG capsule TAKE 1 CAPSULE(250 MG) BY MOUTH TWICE DAILY 60 capsule 9   midodrine (PROAMATINE) 2.5 MG tablet Take 1 tablet (2.5 mg total) by mouth 3 (three) times daily with meals. (Patient taking differently: Take 2.5 mg by mouth 2 (two) times daily with a meal.) 270 tablet 3   nitroGLYCERIN (NITROSTAT) 0.4 MG SL tablet Place 0.4 mg under the tongue as needed.     rosuvastatin (CRESTOR) 40 MG tablet Take 1 tablet (40 mg total) by mouth at bedtime. 30 tablet 0   XARELTO 15 MG TABS tablet TAKE 1 TABLET(15 MG) BY MOUTH DAILY WITH SUPPER 30 tablet 5   No current facility-administered medications for this visit.    Allergies:   Sulfa antibiotics   Social History:  The patient  reports that he has quit smoking. His smoking use included cigarettes. He has a 30.00 pack-year smoking history. He has never used smokeless tobacco. He reports current alcohol use. He reports that he does not use drugs.   Family History:  The patient's family history includes Cancer in his father; Dementia in his mother.   ROS:  Please see the history of present illness.   Otherwise, review of systems is positive for none.   All other systems are reviewed and negative.   PHYSICAL EXAM: VS:  BP 124/80   Pulse 70   Ht 5\' 11"  (1.803 m)   Wt 190 lb 9.6 oz (86.5 kg)   SpO2 99%   BMI 26.58 kg/m  , BMI Body mass index is 26.58 kg/m. GEN: Well nourished, well developed, in no acute distress  HEENT: normal  Neck: no JVD, carotid bruits, or masses Cardiac: RRR; no murmurs, rubs, or gallops,no edema  Respiratory:  clear to auscultation bilaterally, normal work of breathing GI: soft, nontender, nondistended, + BS MS: no deformity or atrophy  Skin: warm and dry, device site well healed Neuro:  Strength and sensation are intact Psych: euthymic mood, full affect  EKG:  EKG is ordered  today. Personal review of the ekg ordered shows sinus rhythm, rate 70, left anterior fascicular block  Personal review of the device interrogation today. Results in [facility]   Recent Labs: 02/09/2022: ALT 16; BUN 19; Creatinine, Ser 1.62; Hemoglobin 14.8; Magnesium 1.8; Platelets 197; Potassium 4.0; Sodium 141    Lipid Panel     Component Value Date/Time   CHOL 180 02/08/2022 0144   CHOL 102 10/07/2021 1024   TRIG 165 (H) 02/08/2022 0144   HDL 28 (L) 02/08/2022 0144   HDL 42 10/07/2021 1024   CHOLHDL 6.4 02/08/2022 0144   VLDL 33 02/08/2022 0144   LDLCALC 119 (H) 02/08/2022 0144   LDLCALC 43 10/07/2021 1024     Wt Readings from Last 3 Encounters:  09/30/22 190 lb 9.6 oz (86.5  kg)  08/06/22 196 lb (88.9 kg)  06/09/22 194 lb 3.2 oz (88.1 kg)      Other studies Reviewed: Additional studies/ records that were reviewed today include: TTE 10/07/21  Review of the above records today demonstrates:   1. Very frequent PVCs during study   2. Difficult to assess LVEF and wall motion due to very frequent ectopy.  Left ventricular ejection fraction, by estimation, is 45 to 50%. The left  ventricle has mildly decreased function. There appears to be global  hypokinesis. There is mild concentric  left ventricular hypertrophy.   3. Right ventricular systolic function is normal. The right ventricular  size is normal.   4. The mitral valve is normal in structure. Trivial mitral valve  regurgitation.   5. Aortic valve regurgitation is trivial.   6. Aortic dilatation noted. There is moderate dilatation of the aortic  root, measuring 44 mm.    ASSESSMENT AND PLAN:  1.  Cryptogenic stroke: Status post Linq monitor implant.  Currently on Xarelto.  2.  Coronary artery disease: Status post non-STEMI with drug-eluting stent to the LAD in 2017.  Ejection fraction 45 to 50%.  Continue management per primary cardiology.  3.  Paroxysmal atrial fibrillation: CHA2DS2-VASc of 7.  Currently on  Xarelto 20 mg daily.  Minimal symptoms.  No changes.  4.  PVCs: Burden of 19% on cardiac monitor.  Currently on mexiletine to 50 mg twice daily.  5.  Aortic atherosclerosis: Lipid management followed by primary cardiology.  Found on CT scan.  Asymptomatic.  6.  Possible ILD: Pulmonary following.  7.  Secondary hypercoagulable state: Currently on Xarelto for atrial fibrillation as above.  Current medicines are reviewed at length with the patient today.   The patient does not have concerns regarding his medicines.  The following changes were made today: none  Labs/ tests ordered today include:  Orders Placed This Encounter  Procedures   EKG 12-Lead      Disposition:   FU 12 months  Signed, Derrel Moore Jorja Loa, MD  09/30/2022 10:58 AM     [facility] [address] [place] [address] [phone] (office) [phone] (fax)

## 2022-10-01 ENCOUNTER — Telehealth: Payer: Self-pay | Admitting: Cardiology

## 2022-10-01 MED ORDER — METOPROLOL SUCCINATE ER 25 MG PO TB24: 25.0000 mg | ORAL_TABLET | Freq: Every day | ORAL | 3 refills | Status: DC

## 2022-10-01 NOTE — Telephone Encounter (Signed)
*  STAT* If patient is at the pharmacy, call can be transferred to refill team.   1. Which medications need to be refilled? (please list name of each medication and dose if known)   metoprolol succinate (TOPROL XL) 25 MG 24 hr tablet   2. Which pharmacy/location (including street and city if local pharmacy) is medication to be sent to?  [facility] [phone] - [place], [place] - [address] AT [facility] [address]   3. Do they need a 30 day or 90 day supply?  90 day  Wife stated she would like a call back to clarify the correct dosage for this medication.  Wife noted patient only has 2 tablets left.

## 2022-10-01 NOTE — Telephone Encounter (Signed)
Pt's medication was sent to pt's pharmacy as requested. Confirmation received.  °

## 2022-10-02 NOTE — Progress Notes (Signed)
Carelink Summary Report / Loop Recorder 

## 2022-10-06 ENCOUNTER — Ambulatory Visit (INDEPENDENT_AMBULATORY_CARE_PROVIDER_SITE_OTHER): Payer: Medicare PPO

## 2022-10-06 DIAGNOSIS — I639 Cerebral infarction, unspecified: Secondary | ICD-10-CM | POA: Diagnosis not present

## 2022-10-07 LAB — CUP PACEART REMOTE DEVICE CHECK
Date Time Interrogation Session: 20231217231744
Implantable Pulse Generator Implant Date: 20220610

## 2022-11-10 ENCOUNTER — Ambulatory Visit: Payer: Medicare PPO | Attending: Cardiology

## 2022-11-10 DIAGNOSIS — I639 Cerebral infarction, unspecified: Secondary | ICD-10-CM

## 2022-11-10 NOTE — Progress Notes (Signed)
Carelink Summary Report / Loop Recorder

## 2022-11-11 LAB — CUP PACEART REMOTE DEVICE CHECK
Date Time Interrogation Session: 20240119231325
Implantable Pulse Generator Implant Date: 20220610

## 2022-11-30 ENCOUNTER — Other Ambulatory Visit: Payer: Self-pay | Admitting: Interventional Cardiology

## 2022-11-30 DIAGNOSIS — I480 Paroxysmal atrial fibrillation: Secondary | ICD-10-CM

## 2022-12-01 NOTE — Telephone Encounter (Signed)
Pt last saw Dr Curt Bears 09/30/22, pt has afib.  Last labs 09/02/22 Creat 2.16, age 82, weight 86.5kg, CrCl 32.82, based on CrCl pt is on appropriate dosage of Xarelto 42m QD.  Will refill rx.

## 2022-12-12 LAB — CUP PACEART REMOTE DEVICE CHECK
Date Time Interrogation Session: 20240221231559
Implantable Pulse Generator Implant Date: 20220610

## 2022-12-15 ENCOUNTER — Ambulatory Visit: Payer: Medicare PPO

## 2022-12-15 DIAGNOSIS — I639 Cerebral infarction, unspecified: Secondary | ICD-10-CM | POA: Diagnosis not present

## 2022-12-24 NOTE — Progress Notes (Signed)
Carelink Summary Report / Loop Recorder 

## 2023-01-03 ENCOUNTER — Emergency Department (HOSPITAL_COMMUNITY)
Admission: EM | Admit: 2023-01-03 | Discharge: 2023-01-03 | Disposition: A | Discharge status: Home / Self Care | Payer: Medicare PPO | Attending: Emergency Medicine | Admitting: Emergency Medicine

## 2023-01-03 ENCOUNTER — Encounter (HOSPITAL_COMMUNITY): Payer: Self-pay

## 2023-01-03 ENCOUNTER — Other Ambulatory Visit: Payer: Self-pay

## 2023-01-03 ENCOUNTER — Emergency Department (HOSPITAL_COMMUNITY): Payer: Medicare PPO

## 2023-01-03 DIAGNOSIS — R7989 Other specified abnormal findings of blood chemistry: Secondary | ICD-10-CM | POA: Diagnosis not present

## 2023-01-03 DIAGNOSIS — E8729 Other acidosis: Secondary | ICD-10-CM | POA: Diagnosis not present

## 2023-01-03 DIAGNOSIS — I2510 Atherosclerotic heart disease of native coronary artery without angina pectoris: Secondary | ICD-10-CM | POA: Diagnosis not present

## 2023-01-03 DIAGNOSIS — E119 Type 2 diabetes mellitus without complications: Secondary | ICD-10-CM | POA: Diagnosis not present

## 2023-01-03 DIAGNOSIS — Z79899 Other long term (current) drug therapy: Secondary | ICD-10-CM | POA: Insufficient documentation

## 2023-01-03 DIAGNOSIS — Z7984 Long term (current) use of oral hypoglycemic drugs: Secondary | ICD-10-CM | POA: Diagnosis not present

## 2023-01-03 DIAGNOSIS — Z7901 Long term (current) use of anticoagulants: Secondary | ICD-10-CM | POA: Insufficient documentation

## 2023-01-03 DIAGNOSIS — I10 Essential (primary) hypertension: Secondary | ICD-10-CM | POA: Insufficient documentation

## 2023-01-03 DIAGNOSIS — K5900 Constipation, unspecified: Secondary | ICD-10-CM | POA: Diagnosis present

## 2023-01-03 LAB — CBC WITH DIFFERENTIAL/PLATELET
Abs Immature Granulocytes: 0.04 10*3/uL (ref 0.00–0.07)
Basophils Absolute: 0.1 10*3/uL (ref 0.0–0.1)
Basophils Relative: 1 %
Eosinophils Absolute: 0.2 10*3/uL (ref 0.0–0.5)
Eosinophils Relative: 2 %
HCT: 42 % (ref 39.0–52.0)
Hemoglobin: 13.5 g/dL (ref 13.0–17.0)
Immature Granulocytes: 0 %
Lymphocytes Relative: 33 %
Lymphs Abs: 3.2 10*3/uL (ref 0.7–4.0)
MCH: 28.8 pg (ref 26.0–34.0)
MCHC: 32.1 g/dL (ref 30.0–36.0)
MCV: 89.7 fL (ref 80.0–100.0)
Monocytes Absolute: 0.9 10*3/uL (ref 0.1–1.0)
Monocytes Relative: 9 %
Neutro Abs: 5.2 10*3/uL (ref 1.7–7.7)
Neutrophils Relative %: 55 %
Platelets: 241 10*3/uL (ref 150–400)
RBC: 4.68 MIL/uL (ref 4.22–5.81)
RDW: 14.6 % (ref 11.5–15.5)
WBC: 9.6 10*3/uL (ref 4.0–10.5)
nRBC: 0 % (ref 0.0–0.2)

## 2023-01-03 LAB — LACTIC ACID, PLASMA
Lactic Acid, Venous: 1.8 mmol/L (ref 0.5–1.9)
Lactic Acid, Venous: 2.6 mmol/L (ref 0.5–1.9)
Lactic Acid, Venous: 1.9 mmol/L (ref 0.5–1.9)

## 2023-01-03 LAB — COMPREHENSIVE METABOLIC PANEL
ALT: 18 U/L (ref 0–44)
AST: 23 U/L (ref 15–41)
Albumin: 3.4 g/dL — ABNORMAL LOW (ref 3.5–5.0)
Alkaline Phosphatase: 60 U/L (ref 38–126)
Anion gap: 11 (ref 5–15)
BUN: 17 mg/dL (ref 8–23)
CO2: 24 mmol/L (ref 22–32)
Calcium: 8.7 mg/dL — ABNORMAL LOW (ref 8.9–10.3)
Chloride: 102 mmol/L (ref 98–111)
Creatinine, Ser: 1.97 mg/dL — ABNORMAL HIGH (ref 0.61–1.24)
GFR, Estimated: 34 mL/min — ABNORMAL LOW (ref >60)
Glucose, Bld: 185 mg/dL — ABNORMAL HIGH (ref 70–99)
Potassium: 3.9 mmol/L (ref 3.5–5.1)
Sodium: 137 mmol/L (ref 135–145)
Total Bilirubin: 0.4 mg/dL (ref 0.3–1.2)
Total Protein: 6.9 g/dL (ref 6.5–8.1)

## 2023-01-03 LAB — URINALYSIS, ROUTINE W REFLEX MICROSCOPIC
Bacteria, UA: NONE SEEN
Bilirubin Urine: NEGATIVE
Glucose, UA: NEGATIVE mg/dL
Ketones, ur: NEGATIVE mg/dL
Nitrite: NEGATIVE
Protein, ur: 30 mg/dL — AB
Specific Gravity, Urine: 1.006 (ref 1.005–1.030)
pH: 7 (ref 5.0–8.0)

## 2023-01-03 LAB — LIPASE, BLOOD: Lipase: 41 U/L (ref 11–51)

## 2023-01-03 MED ORDER — SODIUM CHLORIDE 0.9 % IV BOLUS
1000.0000 mL | Freq: Once | INTRAVENOUS | Status: AC
  Administered 2023-01-03: 1000 mL via INTRAVENOUS

## 2023-01-03 MED ORDER — IOHEXOL 300 MG/ML  SOLN
80.0000 mL | Freq: Once | INTRAMUSCULAR | Status: AC | PRN
  Administered 2023-01-03: 80 mL via INTRAVENOUS

## 2023-01-03 MED ORDER — POLYETHYLENE GLYCOL 3350 17 G PO PACK: 17.0000 g | PACK | Freq: Every day | ORAL | 0 refills | Status: AC

## 2023-01-03 MED ORDER — ONDANSETRON HCL 4 MG/2ML IJ SOLN
4.0000 mg | Freq: Once | INTRAMUSCULAR | Status: AC
  Administered 2023-01-03: 4 mg via INTRAVENOUS
  Filled 2023-01-03: qty 2

## 2023-01-03 MED ORDER — MORPHINE SULFATE (PF) 4 MG/ML IV SOLN
4.0000 mg | Freq: Once | INTRAVENOUS | Status: AC
  Administered 2023-01-03: 4 mg via INTRAVENOUS
  Filled 2023-01-03: qty 1

## 2023-01-03 NOTE — ED Notes (Signed)
Soap suds enema performed. Small amount of brown liquid returned.

## 2023-01-03 NOTE — ED Notes (Signed)
Patient transported to CT 

## 2023-01-03 NOTE — Discharge Instructions (Signed)
Evaluation today revealed that your symptoms are consistent with constipation.  Fecal disimpaction procedure was very successful, I was able to relieve a significant amount of the stool burden.  Recommend that you do follow-up with your PCP in the next 2 to 3 days for reassessment of your symptoms.  Also recommend that you do take the magnesium supplement at home along with your MiraLAX daily and continue taking Colace.  Also please increase the fiber intake and stay well-hydrated to prevent further constipation.  If you have worsening abdominal pain, blood in the stool, blood in the urine, nausea vomiting or any other concerning symptom please return emergency department for further evaluation.

## 2023-01-03 NOTE — ED Notes (Signed)
Date and time results received: 01/03/23 @1439  (use smartphrase ".now" to insert current time)  Test: latic acid  Critical Value: 2.6  Name of Provider Notified: Joanette Gula PA  Orders Received? Or Actions Taken?: No verbalized orders given at this time.

## 2023-01-03 NOTE — ED Triage Notes (Signed)
Reports hasn't had a regular BM in about a week.  Wife has given some miralax and stool softeners with minimal results.

## 2023-01-03 NOTE — ED Provider Notes (Signed)
[facility] Provider Note   CSN: [id number] Arrival date & time: 01/03/23  1118     History  Chief Complaint  Patient presents with   Constipation   HPI Antoan Lagunes is a 82 y.o. male with h/o CAD, TIA, persistent A-fib, type 2 diabetes, and hypertension presenting for constipation. States he has not had a bowel movement for a week now.  He has been trying MiraLAX, gentle move and a lower bowel stimulant which are natural supplements per his wife.  Also takes Colace daily states he did have a stroke 2 years ago and does have intermittent issues with constipation but has never been this bad before wife also mentioned multiple finger disimpaction attempts at home and was able to relieve only a small stool burden.  States he had a transurethral resection of the prostate in July of last year but no other abdominal surgeries.  States he also recently stopped taking his metformin and glipizide 2 weeks ago in preparation for a planned procedure with nephrology.  Also endorsing abdominal pain which is in the lower abdomen, feels sharp and dull, it is nonradiating.  States he is still passing flatus.  Denies nausea and vomiting. Denies pain with eating.    Constipation      Home Medications Prior to Admission medications   Medication Sig Start Date End Date Taking? Authorizing Provider  polyethylene glycol (MIRALAX / GLYCOLAX) 17 g packet Take 17 g by mouth daily. 01/03/23  Yes [name], PA-C  acetaminophen (TYLENOL) 325 MG tablet Take 650 mg by mouth every 6 (six) hours as needed for mild pain, moderate pain or headache.    [provider]  glipiZIDE (GLUCOTROL XL) 5 MG 24 hr tablet Take 5 mg by mouth daily with breakfast.    [provider]  memantine (NAMENDA) 10 MG tablet Take 1 tablet (10 mg total) by mouth 2 (two) times daily. 08/06/22   Garvin Fila, MD  metFORMIN (GLUCOPHAGE) 1000 MG tablet Take 1 tablet (1,000 mg  total) by mouth 2 (two) times daily with a meal. 01/11/21   Emokpae, Courage, MD  metoprolol succinate (TOPROL XL) 25 MG 24 hr tablet Take 1 tablet (25 mg total) by mouth at bedtime. 10/01/22   Camnitz, Ocie Doyne, MD  mexiletine (MEXITIL) 250 MG capsule TAKE 1 CAPSULE(250 MG) BY MOUTH TWICE DAILY 03/31/22   Camnitz, Ocie Doyne, MD  midodrine (PROAMATINE) 2.5 MG tablet Take 1 tablet (2.5 mg total) by mouth 3 (three) times daily with meals. Patient taking differently: Take 2.5 mg by mouth 2 (two) times daily with a meal. 04/16/22   Camnitz, Ocie Doyne, MD  nitroGLYCERIN (NITROSTAT) 0.4 MG SL tablet Place 0.4 mg under the tongue as needed. 08/26/21   [provider]  rosuvastatin (CRESTOR) 40 MG tablet Take 1 tablet (40 mg total) by mouth at bedtime. 02/09/22   Sheikh, Omair Latif, DO  XARELTO 15 MG TABS tablet TAKE 1 TABLET(15 MG) BY MOUTH DAILY WITH SUPPER 12/01/22   Camnitz, Ocie Doyne, MD      Allergies    Sulfa antibiotics    Review of Systems   Review of Systems  Gastrointestinal:  Positive for constipation.    Physical Exam   Vitals:   01/03/23 1600 01/03/23 1630  BP:  (!) 181/98  Pulse: 61 66  Resp: 18 18  Temp: 98.1 F (36.7 C)   SpO2: 97% 98%    CONSTITUTIONAL:  well-appearing, NAD NEURO:  Alert and oriented x 3, CN 3-12 grossly intact EYES:  eyes equal and reactive ENT/NECK:  Supple, no stridor  CARDIO:  regular rate and rhythm, appears well-perfused  PULM:  No respiratory distress, CTAB GI/GU:  non-distended, soft, lower abdominal tenderness, bowel sounds present MSK/SPINE:  No gross deformities, no edema, moves all extremities  SKIN:  no rash, atraumatic   *Additional and/or pertinent findings included in MDM below    ED Results / Procedures / Treatments   Labs (all labs ordered are listed, but only abnormal results are displayed) Labs Reviewed  COMPREHENSIVE METABOLIC PANEL - Abnormal; Notable for the following components:      Result Value    Glucose, Bld 185 (*)    Creatinine, Ser 1.97 (*)    Calcium 8.7 (*)    Albumin 3.4 (*)    GFR, Estimated 34 (*)    All other components within normal limits  URINALYSIS, ROUTINE W REFLEX MICROSCOPIC - Abnormal; Notable for the following components:   Color, Urine STRAW (*)    Hgb urine dipstick SMALL (*)    Protein, ur 30 (*)    Leukocytes,Ua SMALL (*)    All other components within normal limits  LACTIC ACID, PLASMA - Abnormal; Notable for the following components:   Lactic Acid, Venous 2.6 (*)    All other components within normal limits  CBC WITH DIFFERENTIAL/PLATELET  LIPASE, BLOOD  LACTIC ACID, PLASMA  LACTIC ACID, PLASMA    EKG None  Radiology CT Abdomen Pelvis W Contrast  Result Date: 01/03/2023 CLINICAL DATA:  Abdominal pain. Patient reports no regular BM in 1 week. EXAM: CT ABDOMEN AND PELVIS WITH CONTRAST TECHNIQUE: Multidetector CT imaging of the abdomen and pelvis was performed using the standard protocol following bolus administration of intravenous contrast. RADIATION DOSE REDUCTION: This exam was performed according to the departmental dose-optimization program which includes automated exposure control, adjustment of the mA and/or kV according to patient size and/or use of iterative reconstruction technique. CONTRAST:  58mL OMNIPAQUE IOHEXOL 300 MG/ML  SOLN COMPARISON:  05/28/2021 FINDINGS: Lower chest: No acute abnormality. Hepatobiliary: No focal liver abnormality. Partially calcified gallstones are again noted within the gallbladder neck which measure up to 1.6 cm. There is mild wall thickening and enhancement involving the medial wall of the gallbladder, image 24/2. This measures up to 5 mm. No significant pericholecystic inflammation. No bile duct dilatation. Pancreas: Unremarkable. No pancreatic ductal dilatation or surrounding inflammatory changes. Spleen: Normal in size without focal abnormality. Adrenals/Urinary Tract: Normal adrenal glands. The right kidney appears  normal without signs of nephrolithiasis, hydronephrosis or mass. 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 No focal bladder abnormality identified. Stomach/Bowel: Stomach appears normal. No pathologic dilatation of the large or small bowel loops. The appendix is visualized and appears normal. Moderate retained stool noted within the colon with considerable amount of desiccated stool noted within the rectum. Vascular/Lymphatic: Aortic atherosclerosis. No signs of abdominopelvic adenopathy. 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 Other: No free fluid or fluid collections. No signs of pneumoperitoneum. Musculoskeletal: Lumbar spondylosis. This is most severe at L5-S1. Mild age-indeterminate superior endplate deformity is noted involving the T12 vertebra. This is new when compared with CT from 01/13/2022. IMPRESSION: 1. Moderate retained stool noted within the colon with considerable amount of desiccated stool noted within the rectum. Correlate for any clinical signs or symptoms of constipation. 2. Gallstones with mild wall thickening and enhancement involving the medial wall of the gallbladder. This  appears similar to study from 05/28/2021. No surrounding inflammatory fat stranding. Correlate for any clinical signs or symptoms of acute cholecystitis. 3. 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 4. Mild age-indeterminate superior endplate deformity is noted involving the T12 vertebra. This is new when compared with CT from 01/13/2022. 5. 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 6. Aortic Atherosclerosis (ICD10-I70.0). Electronically Signed   By: Kerby Moors M.D.   On: 01/03/2023 13:56    Procedures Fecal disimpaction  Date/Time: 01/03/2023 5:22 PM  Performed by: [name], PA-C Authorized by: [name], PA-C  Consent: Verbal consent obtained. Consent given  by: patient and spouse Patient understanding: patient states understanding of the procedure being performed Patient consent: the patient's understanding of the procedure matches consent given Patient identity confirmed: verbally with patient and arm band Time out: Immediately prior to procedure a "time out" was called to verify the correct patient, procedure, equipment, support staff and site/side marked as required. Preparation: Patient was prepped and draped in the usual sterile fashion. Local anesthesia used: no  Anesthesia: Local anesthesia used: no  Sedation: Patient sedated: no  Patient tolerance: patient tolerated the procedure well with no immediate complications       Medications Ordered in ED Medications  iohexol (OMNIPAQUE) 300 MG/ML solution 80 mL (80 mLs Intravenous Contrast Given 01/03/23 1319)  ondansetron (ZOFRAN) injection 4 mg (4 mg Intravenous Given 01/03/23 1409)  morphine (PF) 4 MG/ML injection 4 mg (4 mg Intravenous Given 01/03/23 1411)  sodium chloride 0.9 % bolus 1,000 mL (0 mLs Intravenous Stopped 01/03/23 1643)    ED Course/ Medical Decision Making/ A&P                             Medical Decision Making Amount and/or Complexity of Data Reviewed Labs: ordered. Radiology: ordered.  Risk Prescription drug management.   Initial Impression and Ddx 82 year old male who is well-appearing presenting for constipation.  Exam notable for lower abdominal tenderness but otherwise reassuring.  DDx includes diverticulitis, colitis, bowel perforation, constipation, and appendicitis. Patient PMH that increases complexity of ED encounter: CAD, TIA, persistent A-fib, type 2 diabetes, and hypertension  Interpretation of Diagnostics - I independent reviewed and interpreted the labs as followed: Mild lactic acidosis, elevated creatinine  - I independently visualized the following imaging with scope of interpretation limited to determining acute life threatening  conditions related to emergency care: CT abdomen pelvis, which revealed prostate enlargement and moderate stool burden, markedly unchanged wall thickening of the gallbladder  Patient Reassessment and Ultimate Disposition/Management Overall, concern was likely constipation versus intra-abdominal infection.  Fortunately CT scan was rather unremarkable did reveal moderate stool burden.  Treated with fecal disimpaction and was able to relieve a significant amount of stool.  Patient today immediately endorsed improvement of his pain.  Also attempted SSE but was very successful according to nursing staff.  Second lactic acidosis was slightly elevated above prior.  Suspected dehydration.  Treated with volume bolus rechecked and third lactate was within normal limits.  Discharged with stable vitals.  Advised to follow-up with PCP.  Discussed return precautions.  Patient management required discussion with the following services or consulting groups:  None  Complexity of Problems Addressed Acute complicated illness or Injury  Additional Data Reviewed and Analyzed Further history obtained from: Past medical history and medications listed in the EMR, Prior ED visit notes, and Recent discharge summary  Patient Encounter  Risk Assessment Prescriptions         Final Clinical Impression(s) / ED Diagnoses Final diagnoses:  Constipation, unspecified constipation type    Rx / DC Orders ED Discharge Orders          Ordered    polyethylene glycol (MIRALAX / GLYCOLAX) 17 g packet  Daily        01/03/23 1522              [name], PA-C 01/03/23 1735    Sherwood Gambler, MD 01/04/23 [phone]

## 2023-01-07 ENCOUNTER — Emergency Department (HOSPITAL_COMMUNITY): Payer: Medicare PPO

## 2023-01-07 ENCOUNTER — Other Ambulatory Visit: Payer: Self-pay

## 2023-01-07 ENCOUNTER — Emergency Department (HOSPITAL_COMMUNITY)
Admission: EM | Admit: 2023-01-07 | Discharge: 2023-01-07 | Disposition: A | Discharge status: Home / Self Care | Payer: Medicare PPO | Attending: Emergency Medicine | Admitting: Emergency Medicine

## 2023-01-07 ENCOUNTER — Encounter (HOSPITAL_COMMUNITY): Payer: Self-pay | Admitting: *Deleted

## 2023-01-07 DIAGNOSIS — E1122 Type 2 diabetes mellitus with diabetic chronic kidney disease: Secondary | ICD-10-CM | POA: Insufficient documentation

## 2023-01-07 DIAGNOSIS — Z7984 Long term (current) use of oral hypoglycemic drugs: Secondary | ICD-10-CM | POA: Diagnosis not present

## 2023-01-07 DIAGNOSIS — K5900 Constipation, unspecified: Secondary | ICD-10-CM

## 2023-01-07 DIAGNOSIS — N189 Chronic kidney disease, unspecified: Secondary | ICD-10-CM | POA: Insufficient documentation

## 2023-01-07 DIAGNOSIS — Z7901 Long term (current) use of anticoagulants: Secondary | ICD-10-CM | POA: Diagnosis not present

## 2023-01-07 DIAGNOSIS — R1030 Lower abdominal pain, unspecified: Secondary | ICD-10-CM | POA: Diagnosis present

## 2023-01-07 DIAGNOSIS — Z8673 Personal history of transient ischemic attack (TIA), and cerebral infarction without residual deficits: Secondary | ICD-10-CM | POA: Insufficient documentation

## 2023-01-07 DIAGNOSIS — I2510 Atherosclerotic heart disease of native coronary artery without angina pectoris: Secondary | ICD-10-CM | POA: Insufficient documentation

## 2023-01-07 DIAGNOSIS — D72829 Elevated white blood cell count, unspecified: Secondary | ICD-10-CM | POA: Diagnosis not present

## 2023-01-07 LAB — CBC WITH DIFFERENTIAL/PLATELET
Abs Immature Granulocytes: 0.06 10*3/uL (ref 0.00–0.07)
Basophils Absolute: 0.1 10*3/uL (ref 0.0–0.1)
Basophils Relative: 1 %
Eosinophils Absolute: 0.1 10*3/uL (ref 0.0–0.5)
Eosinophils Relative: 1 %
HCT: 40.9 % (ref 39.0–52.0)
Hemoglobin: 13.1 g/dL (ref 13.0–17.0)
Immature Granulocytes: 1 %
Lymphocytes Relative: 29 %
Lymphs Abs: 3.4 10*3/uL (ref 0.7–4.0)
MCH: 28.9 pg (ref 26.0–34.0)
MCHC: 32 g/dL (ref 30.0–36.0)
MCV: 90.1 fL (ref 80.0–100.0)
Monocytes Absolute: 1.1 10*3/uL — ABNORMAL HIGH (ref 0.1–1.0)
Monocytes Relative: 9 %
Neutro Abs: 7 10*3/uL (ref 1.7–7.7)
Neutrophils Relative %: 59 %
Platelets: 247 10*3/uL (ref 150–400)
RBC: 4.54 MIL/uL (ref 4.22–5.81)
RDW: 14.6 % (ref 11.5–15.5)
WBC: 11.7 10*3/uL — ABNORMAL HIGH (ref 4.0–10.5)
nRBC: 0 % (ref 0.0–0.2)

## 2023-01-07 LAB — COMPREHENSIVE METABOLIC PANEL
ALT: 15 U/L (ref 0–44)
AST: 18 U/L (ref 15–41)
Albumin: 3.3 g/dL — ABNORMAL LOW (ref 3.5–5.0)
Alkaline Phosphatase: 60 U/L (ref 38–126)
Anion gap: 7 (ref 5–15)
BUN: 18 mg/dL (ref 8–23)
CO2: 26 mmol/L (ref 22–32)
Calcium: 8.4 mg/dL — ABNORMAL LOW (ref 8.9–10.3)
Chloride: 103 mmol/L (ref 98–111)
Creatinine, Ser: 2.09 mg/dL — ABNORMAL HIGH (ref 0.61–1.24)
GFR, Estimated: 31 mL/min — ABNORMAL LOW (ref >60)
Glucose, Bld: 175 mg/dL — ABNORMAL HIGH (ref 70–99)
Potassium: 3.8 mmol/L (ref 3.5–5.1)
Sodium: 136 mmol/L (ref 135–145)
Total Bilirubin: 0.4 mg/dL (ref 0.3–1.2)
Total Protein: 6.6 g/dL (ref 6.5–8.1)

## 2023-01-07 LAB — LIPASE, BLOOD: Lipase: 43 U/L (ref 11–51)

## 2023-01-07 LAB — LACTIC ACID, PLASMA: Lactic Acid, Venous: 1.4 mmol/L (ref 0.5–1.9)

## 2023-01-07 NOTE — ED Notes (Signed)
Pt had small/medium BM after soap suds enema.

## 2023-01-07 NOTE — Discharge Instructions (Signed)
Take increased doses of MiraLAX at home for bowel cleanout.  After bowel cleanout, continue stool softeners and fiber for maintenance of daily soft stools.  Return to the emergency department for any new or worsening symptoms of concern.

## 2023-01-07 NOTE — ED Provider Notes (Signed)
[facility] Provider Note   CSN: [id number] Arrival date & time: 01/07/23  1856     History  Chief Complaint  Patient presents with   Constipation    Shane Welch is a 82 y.o. male.   Constipation Associated symptoms: abdominal pain   Patient presents for constipation.  Medical history includes CAD, HLD, DM, CVA, atrial fibrillation, CKD, pulmonary fibrosis.  Typically, he will have a bowel movement every other day.  Over the past 4 weeks, he has had issues with constipation.  He did undergo some medication changes and feels that this may have contributed.  He was seen in the ED 4 days ago for similar symptoms.  Prior to that, he had been trying MiraLAX and Colace.  Wife had performed finger disimpaction attempt at home.  During his prior ED visit, he underwent fecal disimpaction and enema.  He was given IV fluids.  CT scan at that time showed moderate stool burden.  He was advised to take MiraLAX, magnesium, and Metamucil which she has been doing.  He takes 1 capful of MiraLAX a day.  He has not had any issues with nausea and has been eating and drinking normally.  Since his ED visit, he has not had any further stool output.  He has had lower abdominal pain that has been present since his ED visit 4 days ago and feels that it is mildly worsening.     Home Medications Prior to Admission medications   Medication Sig Start Date End Date Taking? Authorizing Provider  acetaminophen (TYLENOL) 325 MG tablet Take 650 mg by mouth every 6 (six) hours as needed for mild pain, moderate pain or headache.    [provider]  glipiZIDE (GLUCOTROL XL) 5 MG 24 hr tablet Take 5 mg by mouth daily with breakfast.    [provider]  memantine (NAMENDA) 10 MG tablet Take 1 tablet (10 mg total) by mouth 2 (two) times daily. 08/06/22   Garvin Fila, MD  metFORMIN (GLUCOPHAGE) 1000 MG tablet Take 1 tablet (1,000 mg total) by mouth 2 (two) times  daily with a meal. 01/11/21   Emokpae, Courage, MD  metoprolol succinate (TOPROL XL) 25 MG 24 hr tablet Take 1 tablet (25 mg total) by mouth at bedtime. 10/01/22   Camnitz, Ocie Doyne, MD  mexiletine (MEXITIL) 250 MG capsule TAKE 1 CAPSULE(250 MG) BY MOUTH TWICE DAILY 03/31/22   Camnitz, Ocie Doyne, MD  midodrine (PROAMATINE) 2.5 MG tablet Take 1 tablet (2.5 mg total) by mouth 3 (three) times daily with meals. Patient taking differently: Take 2.5 mg by mouth 2 (two) times daily with a meal. 04/16/22   Camnitz, Ocie Doyne, MD  nitroGLYCERIN (NITROSTAT) 0.4 MG SL tablet Place 0.4 mg under the tongue as needed. 08/26/21   [provider]  polyethylene glycol (MIRALAX / GLYCOLAX) 17 g packet Take 17 g by mouth daily. 01/03/23   [name], PA-C  rosuvastatin (CRESTOR) 40 MG tablet Take 1 tablet (40 mg total) by mouth at bedtime. 02/09/22   Sheikh, Omair Latif, DO  XARELTO 15 MG TABS tablet TAKE 1 TABLET(15 MG) BY MOUTH DAILY WITH SUPPER 12/01/22   Camnitz, Ocie Doyne, MD      Allergies    Sulfa antibiotics    Review of Systems   Review of Systems  Gastrointestinal:  Positive for abdominal pain and constipation.  All other systems reviewed and are negative.   Physical Exam Updated Vital Signs BP Marland Kitchen)  145/97 (BP Location: Left Arm)   Pulse 79   Temp 98.6 F (37 C) (Temporal)   Resp 16   SpO2 98%  Physical Exam Vitals and nursing note reviewed.  Constitutional:      General: He is not in acute distress.    Appearance: Normal appearance. He is well-developed. He is not ill-appearing, toxic-appearing or diaphoretic.  HENT:     Head: Normocephalic and atraumatic.     Right Ear: External ear normal.     Left Ear: External ear normal.     Nose: Nose normal.     Mouth/Throat:     Mouth: Mucous membranes are moist.  Eyes:     Extraocular Movements: Extraocular movements intact.     Conjunctiva/sclera: Conjunctivae normal.  Cardiovascular:     Rate and Rhythm: Normal rate and  regular rhythm.  Pulmonary:     Effort: Pulmonary effort is normal. No respiratory distress.  Abdominal:     General: There is no distension.     Palpations: Abdomen is soft.     Tenderness: There is abdominal tenderness.  Musculoskeletal:        General: No swelling. Normal range of motion.     Cervical back: Neck supple.     Right lower leg: No edema.     Left lower leg: No edema.  Skin:    General: Skin is warm and dry.     Coloration: Skin is not jaundiced or pale.  Neurological:     General: No focal deficit present.     Mental Status: He is alert and oriented to person, place, and time.     Cranial Nerves: No cranial nerve deficit.     Sensory: No sensory deficit.     Motor: No weakness.     Coordination: Coordination normal.  Psychiatric:        Mood and Affect: Mood normal.        Behavior: Behavior normal.        Thought Content: Thought content normal.        Judgment: Judgment normal.     ED Results / Procedures / Treatments   Labs (all labs ordered are listed, but only abnormal results are displayed) Labs Reviewed  COMPREHENSIVE METABOLIC PANEL - Abnormal; Notable for the following components:      Result Value   Glucose, Bld 175 (*)    Creatinine, Ser 2.09 (*)    Calcium 8.4 (*)    Albumin 3.3 (*)    GFR, Estimated 31 (*)    All other components within normal limits  CBC WITH DIFFERENTIAL/PLATELET - Abnormal; Notable for the following components:   WBC 11.7 (*)    Monocytes Absolute 1.1 (*)    All other components within normal limits  LIPASE, BLOOD  LACTIC ACID, PLASMA  URINALYSIS, ROUTINE W REFLEX MICROSCOPIC    EKG None  Radiology CT ABDOMEN PELVIS WO CONTRAST  Result Date: 01/07/2023 CLINICAL DATA:  Constipation no bowel movements EXAM: CT ABDOMEN AND PELVIS WITHOUT CONTRAST TECHNIQUE: Multidetector CT imaging of the abdomen and pelvis was performed following the standard protocol without IV contrast. RADIATION DOSE REDUCTION: This exam was  performed according to the departmental dose-optimization program which includes automated exposure control, adjustment of the mA and/or kV according to patient size and/or use of iterative reconstruction technique. COMPARISON:  CT 01/03/2023 FINDINGS: Lower chest: Lung bases demonstrate subpleural fibrosis. No acute airspace disease. Hepatobiliary: Gallstone. No biliary dilatation or focal hepatic abnormality Pancreas: Unremarkable. No pancreatic ductal dilatation  or surrounding inflammatory changes. Spleen: Normal in size without focal abnormality. Adrenals/Urinary Tract: Adrenal glands are within normal limits. Left pelvic kidney. No evidence for hydronephrosis. The bladder is unremarkable Stomach/Bowel: Stomach nonenlarged. No dilated small bowel. Marked stool throughout the colon. Mild rectal distension due to formed feces. No wall thickening. Mild presacral fluid and edema. Vascular/Lymphatic: Moderate aortic atherosclerosis. No aneurysm. No suspicious lymph nodes Reproductive: Prostate is slightly enlarged Other: Negative for pelvic effusion or free air. Small fat containing left inguinal hernia Musculoskeletal: Chronic compression deformity at L2. Mild age indeterminate superior endplate deformity at 624THL IMPRESSION: 1. Large volume of stool throughout the colon consistent with constipation. Mild rectal distension due to formed feces. There is mild presacral fluid and edema but no wall thickening. 2. Gallstones without right upper quadrant inflammation. 3. Left pelvic kidney. 4. Mild age indeterminate superior endplate deformity at 624THL. 5. Aortic atherosclerosis. Aortic Atherosclerosis (ICD10-I70.0). Electronically Signed   By: Donavan Foil M.D.   On: 01/07/2023 22:28    Procedures Fecal disimpaction  Date/Time: 01/07/2023 11:26 PM  Performed by: Godfrey Pick, MD Authorized by: Godfrey Pick, MD  Consent: Verbal consent obtained. Consent given by: patient Patient understanding: patient states  understanding of the procedure being performed Patient identity confirmed: verbally with patient Local anesthesia used: no  Anesthesia: Local anesthesia used: no  Sedation: Patient sedated: no  Patient tolerance: patient tolerated the procedure well with no immediate complications       Medications Ordered in ED Medications - No data to display  ED Course/ Medical Decision Making/ A&P                             Medical Decision Making Amount and/or Complexity of Data Reviewed Labs: ordered. Radiology: ordered.   This patient presents to the ED for concern of constipation, this involves an extensive number of treatment options, and is a complaint that carries with it a high risk of complications and morbidity.  The differential diagnosis includes fecal impaction, slow transit, polypharmacy, bowel obstruction   Co morbidities that complicate the patient evaluation  CAD, HLD, DM, CVA, atrial fibrillation, CKD, pulmonary fibrosis   Additional history obtained:  Additional history obtained from patient's wife External records from outside source obtained and reviewed including EMR   Lab Tests:  I Ordered, and personally interpreted labs.  The pertinent results include: Mild leukocytosis is present.  Creatinine is baseline.  Electrolytes are normal.   Imaging Studies ordered:  I ordered imaging studies including CT of abdomen and pelvis I independently visualized and interpreted imaging which showed large volume of stool throughout colon with formed feces and rectum I agree with the radiologist interpretation   Cardiac Monitoring: / EKG:  The patient was maintained on a cardiac monitor.  I personally viewed and interpreted the cardiac monitored which showed an underlying rhythm of: Sinus rhythm   Problem List / ED Course / Critical interventions / Medication management  Patient presents for ongoing constipation that has been refractory to home stool softeners,  magnesium, and fiber.  He was seen in the ED 4 days ago and underwent disimpaction and enema.  He has not had a bowel movement since.  He has had ongoing lower abdominal pain.  He is overall well-appearing on exam.  Abdomen does not appear distended.  He denies any issues with nausea or vomiting.  He does have generalized abdominal tenderness that is worse in the lower abdomen.  Per chart  review, patient CT scan from 4 days ago showed moderate retained stool in colon and rectum, gallstones with mild wall thickening and enhancement of medial wall of gallbladder, prostate enlargement with mass effect upon bladder base.  His lab work from 4 days ago was notable for small mount of pyuria and lactic acidosis which did resolve after IV fluids.  Will repeat lab work and CT imaging today.  Lab work was unremarkable.  CT imaging showed large volume of stool throughout colon and formed feces in the rectum.  Patient underwent fecal disimpaction with a large amount of firm stool balls removed.  He underwent soapsuds enema.  He was advised to increase his home dose of MiraLAX for bowel cleanout.  He was discharged in stable condition. I ordered medication including soapsuds enema for constipation Reevaluation of the patient after these medicines showed that the patient improved I have reviewed the patients home medicines and have made adjustments as needed   Social Determinants of Health:  Has access to outpatient care        Final Clinical Impression(s) / ED Diagnoses Final diagnoses:  Constipation, unspecified constipation type    Rx / DC Orders ED Discharge Orders     None         Godfrey Pick, MD 01/07/23 [phone]

## 2023-01-07 NOTE — ED Triage Notes (Signed)
Pt here for constipation, seen for same on Saturday. Had a soap suds enema on Saturday.  No BM since.

## 2023-01-08 ENCOUNTER — Telehealth: Payer: Self-pay | Admitting: Neurology

## 2023-01-08 NOTE — Telephone Encounter (Signed)
Pt's wife would like to discuss with RN the severe constipation he is suffering due to the  memantine (NAMENDA) 10 MG tablet  Please advise !

## 2023-01-12 NOTE — Telephone Encounter (Signed)
Pt wife called again. Requesting a call from nurse to discuss severe constipation.

## 2023-01-12 NOTE — Telephone Encounter (Signed)
Spoke to wife, states pt was seen in ED twice last week due to constipation. States constipation stared 2-3 months ago while on Namenda and has progressively gotten within the last few weeks. Wife has stopped giving him Namenda and scheduled to see PCP tomorrow.

## 2023-01-19 ENCOUNTER — Ambulatory Visit (INDEPENDENT_AMBULATORY_CARE_PROVIDER_SITE_OTHER): Payer: Medicare PPO

## 2023-01-19 DIAGNOSIS — I639 Cerebral infarction, unspecified: Secondary | ICD-10-CM | POA: Diagnosis not present

## 2023-01-19 LAB — CUP PACEART REMOTE DEVICE CHECK
Date Time Interrogation Session: 20240331231720
Implantable Pulse Generator Implant Date: 20220610

## 2023-01-21 ENCOUNTER — Other Ambulatory Visit: Payer: Self-pay | Admitting: Cardiology

## 2023-01-22 NOTE — Progress Notes (Signed)
Carelink Summary Report / Loop Recorder 

## 2023-02-05 ENCOUNTER — Encounter: Payer: Self-pay | Admitting: Neurology

## 2023-02-05 ENCOUNTER — Ambulatory Visit: Payer: Medicare PPO | Admitting: Neurology

## 2023-02-05 VITALS — BP 135/82 | HR 83 | Ht 71.0 in | Wt 182.0 lb

## 2023-02-05 DIAGNOSIS — Z8673 Personal history of transient ischemic attack (TIA), and cerebral infarction without residual deficits: Secondary | ICD-10-CM | POA: Diagnosis not present

## 2023-02-05 DIAGNOSIS — G301 Alzheimer's disease with late onset: Secondary | ICD-10-CM | POA: Diagnosis not present

## 2023-02-05 DIAGNOSIS — R269 Unspecified abnormalities of gait and mobility: Secondary | ICD-10-CM | POA: Diagnosis not present

## 2023-02-05 DIAGNOSIS — F01B0 Vascular dementia, moderate, without behavioral disturbance, psychotic disturbance, mood disturbance, and anxiety: Secondary | ICD-10-CM | POA: Diagnosis not present

## 2023-02-05 MED ORDER — MEMANTINE HCL 10 MG PO TABS: 10.0000 mg | ORAL_TABLET | Freq: Two times a day (BID) | ORAL | 1 refills | Status: DC

## 2023-02-05 NOTE — Progress Notes (Signed)
[facility] [address] [place]. [place] [address] [phone]       OFFICE FOLLOW UP VISIT NOTE  [name] Date of Birth:  26-Dec-1940 Medical Record Number:  [id number]   Referring MD: Windle Guard Reason for Referral: Stroke HPI: Initial visit 02/19/2021: [name] is a pleasant 5 Caucasian male seen today for initial office consultation visit for stroke.  He is accompanied by his wife [name] and history is obtained from them and review of electronic medical records and I personally reviewed available imaging films in PACS.  He has past medical history of diabetes, hypertension, hyperlipidemia and coronary artery disease.  He was seen on 01/07/2021 at [facility].  He was sitting on a stool in his kitchen table when if fell down.  He is unable to get up and was off balance.  He was also confused and disoriented.  He stated he was seeing black spots in front of both eyes.  He was brought in by EMS and was quite agitated when he came in.  Patient states he has no memory of his 5-day hospitalization.  He was seen on arrival by telemetry neurologist who thought he had a TIA and NIH stroke scale on admission was 0.  CT scan of the head on admission was unremarkable and subsequent hospital admission and MRI and further work-up was suggested.  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  2D echo showed normal ejection fraction without cardiac source of embolism.  LDL cholesterol was 1 1 3  mg percent.  Hemoglobin A1c was 6.8.  Urine drug screen was negative.  Patient was discharged on aspirin and Plavix for 3 weeks and subsequently Plavix alone.  Patient states he still sees occasional black spots in front of his eyes but his vision is mostly  improved.  He is also improved in his coordination and balance.  He however has noticed worsening of his baseline memory and cognitive difficulties which he was actually having for the last 5 6 years ago and before his stroke.  He also complains of feeling tired and having low energy.  Patient was started on Lipitor but his primary care physician has changed him to Crestor because of urinary retention.  Patient does have family history of Alzheimer's in his mother and worries about it.  He is tolerating Plavix well without bruising or bleeding.  His blood pressure is well controlled today slightly elevated in office at 146/95.  He was having some orthostatic dizziness but he has been started on midodrine which seems to have helped.  He has no prior history of strokes TIA seizures prior neurological problems.  He denies any history of atrial fibrillation, cardiac arrhythmias but does have history of coronary artery disease and is undergone stenting by Dr. Eldridge Dace. Update 07/09/2021 : He returns for follow-up after last visit 4 months ago.  He is accompanied by his wife.  She states that his memory is not better.  He continues to have short-term memory difficulties and trouble remembering recent information.  He struggles with calculations and with numbers.  He still mostly independent but wife has to keep a close watch on him.  He denies any symptoms of delusion, hallucinations, unsafe behavior.  His gait and balance are good with no  falls or injuries.  He has been having some bradycardia and has an appointment upcoming to see cardiologist Dr. Elberta Fortis for this.  He has finished working with physical therapy for his balance.  He had lab work for reversible causes of memory loss on 02/19/2021 all of which were normal.  EEG on 03/08/2019 showed mild generalized slowing without epileptiform activity.  He had a loop recorder inserted and so for paroxysmal A. fib has not yet been found.  On Mini-Mental status exam testing  today scored 24/30.  He has s was not found to be depressed on geriatric depression scale.   Update 11/11/2021: He returns for follow-up after last visit 4 months ago.  He is accompanied by his wife.  He states he noticed improvement in his cognitive difficulties after starting Aricept and was tolerating 10 mg daily quite well but then after the first few weeks of improvement his benefit plateaued out.  He however started having some nasal bleeding and he felt this may have been related to Aricept so he reduced the dose to 5 mg.  However his cardiologist recently switched Eliquis to Xarelto last Friday and so far is tolerated well without any recurrence of nasal bleeding.  Patient has not had any recurrent stroke or TIA symptoms.  He had lipid profile on 10/07/2021 with LDL cholesterol being optimal at 43 mg percent.  On Mini-Mental status exam today scored 24/30 on recall 1/3 and able to name 9 animals which can walk on 4 legs.  On the clock drawing he scored 3/4.  This appears unchanged compared to last visit Update 04/03/2022 : He returns for follow-up after last visit for an months ago.  He is accompanied by his wife who provides most of the history.  Patient was admitted on 02/07/2022 to [facility] with sudden onset of speech difficulties.  He had difficult time finding the right words to use and family noted that he was struggling to communicate.  Symptoms improved after reaching the emergency room and hence was not felt to be a candidate for thrombolysis.  Patient had a history of atrial fibrillation and previously been on Eliquis but was discontinued due to nasal bleed.  He was switched to Xarelto but patient electively stopped taking it several months ago.  He also stopped his most of his medications except for diabetes medicine.  CT head was negative for acute process.  Lab work was significant for elevated creatinine of 1.8.  MRI scan of the brain showed no acute infarct.  Echocardiogram showed  decreased ejection fraction of 40 to 45% with mild global hypokinesis.  LDL cholesterol is elevated at 119 mg percent and hemoglobin A1c was 6.6.  Patient was counseled to be compliant with all his medications and restarted on anticoagulation with Xarelto.  Patient has been pain at home but he has been having a urinary problem and has a indwelling urinary catheter.  They are waiting for follow-up appointment with [facility] to see if catheter can come out.  Patient is able to walk with a cane but is unsteady and his wife has to walk close behind him.  He has had 2 falls in the last 3 weeks.  Patient states that when he walks his feet move faster and he has trouble and slowing himself down.  Continues to have memory difficulties and cognitive impairment.  He is tolerating Aricept in the past.  He was offered Namenda but declined in the past but states now he is willing  to try it.  He does get some occasional visual hallucinations and does get disoriented from time to time.  He scored 22/30 on Mini-Mental testing today which is slight decline from 24/30 at last visit. Update 08/06/2022 ; he returns for follow-up after last visit 4 months ago.  He is accompanied by his wife.  They both feel he is doing a lot better after starting Namenda which she is tolerating well without side effects.  He can now dress himself as well as pentoxifylline memory is improved.  The wife in fact feels that his gait instability is also better than his abnormal falls.  Patient denies any delusions, hallucinations, unsafe behavior agitation.  He does state quite a bit.  He remains on Xarelto which is tolerating well without bleeding or bruising.  His sugars are doing well and blood pressures under good control.  He has no new complaints.  On Mini-Mental status exam today scored 21/30 which is unchanged from last visit. Update/18/2024 : He returns for follow-up after last visit 6 months ago.  He is accompanied by his wife.  They feel  he has some cognitive worsening in the last few months after stopping Namenda.  Patient developed constipation in the right provide possible risk of side effects he decided to stop it.  He is working with the primary care physician on the bowel regimen but still has constipation.  Wife is not convinced that is not causing symptoms related to he has not been walking a lot and chair with his wheelchair.  He walks a little bit from his bed to the bathroom agitation, delusions.  He does get irritable from time to time but has never been violent.  He can feed himself.  His limited vision  is also responsible for his limited ambulation.  He did not fall once. When he tried to get up and walk by himself.  On Mini-Mental status exam Today He Scored 16/30 which is  a Decline from 21/30 at Last Visit ROS:   14 system review of systems is positive for nasal bleeding,, headache, calculation difficonfusion, disorientation, memory loss, decreased energy, fatigue, falls, walking difficulty, urinary retention, generalized weakness.tiredness all other systems negative  PMH:  Past Medical History:  Diagnosis Date   CAD (coronary artery disease) 12/10/2015   Ant MI 2/17>>DES to LAD // Myoview 12/22: EF 37, no ischemia (int risk)   CKD (chronic kidney disease), stage III 10/01/2021   Diabetes 12/10/2015   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   Echo 6/17: 42 mm // Echo 10/2018: 43 mm // Echocardiogram 3/22: 42 mm // Echo 12/22: 44 mm   Essential hypertension 09/03/2017   Hyperlipidemia    Ischemic cardiomyopathy    s/p Ant MI 2/17 - Echo EF 40-45 // Echo 6/17: EF 55 // Echo 10/2018: EF 50 // Echo 3/22: EF 50-55   Paroxysmal atrial fibrillation 09/30/2021   S/p CVA in 3/22 >> ILR // AFib IDd on ILR>>anticoagulation started   PVC's (premature ventricular contractions) 09/30/2021   Monitor 9/22: PVCs 33% // Metoprolol succ started // ILR interr 11/22: PVCs 22.5% // Echo 12/22: Frequent PVCs; EF 45-50, global HK, mild concentric  LVH, normal RVSF, trivial MR, trivial AI, aortic root 44 mm   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 Dx ~ 2010.    Social History:  Social History   Socioeconomic History   Marital status: Married  Spouse name: [name]   Number of children: Not on file   Years of education: Not on file   Highest education level: Not on file  Occupational History   Not on file  Tobacco Use   Smoking status: Former    Packs/day: 1.00    Years: 30.00    Additional pack years: 0.00    Total pack years: 30.00    Types: Cigarettes   Smokeless tobacco: Never   Tobacco comments:    quit 1993  Vaping Use   Vaping Use: Never used  Substance and Sexual Activity   Alcohol use: Yes    Alcohol/week: 0.0 standard drinks of alcohol    Comment: rare drink   Drug use: No   Sexual activity: Not on file  Other Topics Concern   Not on file  Social History Narrative   Lives with wife and son   Right Handed   Drinks 2-3 cups caffeine daily   Social Determinants of Health   Financial Resource Strain: Not on file  Food Insecurity: Not on file  Transportation Needs: Not on file  Physical Activity: Not on file  Stress: Not on file  Social Connections: Not on file  Intimate Partner Violence: Not on file    Medications:   Current Outpatient Medications on File Prior to Visit  Medication Sig Dispense Refill   acetaminophen (TYLENOL) 325 MG tablet Take 650 mg by mouth every 6 (six) hours as needed for mild pain, moderate pain or headache.     mexiletine (MEXITIL) 250 MG capsule TAKE 1 CAPSULE(250 MG) BY MOUTH TWICE DAILY 60 capsule 9   midodrine (PROAMATINE) 2.5 MG tablet Take 1 tablet (2.5 mg total) by mouth 3 (three) times daily with meals. (Patient taking differently: Take 2.5 mg by mouth 2 (two) times daily with a meal.) 270 tablet 3   nitroGLYCERIN (NITROSTAT) 0.4 MG SL tablet Place 0.4 mg under the tongue as needed.     polyethylene  glycol (MIRALAX / GLYCOLAX) 17 g packet Take 17 g by mouth daily. 14 each 0   rosuvastatin (CRESTOR) 20 MG tablet Take 20 mg by mouth daily.     XARELTO 15 MG TABS tablet TAKE 1 TABLET(15 MG) BY MOUTH DAILY WITH SUPPER 30 tablet 5   No current facility-administered medications on file prior to visit.    Allergies:   Allergies  Allergen Reactions   Sulfa Antibiotics Hives    Physical Exam General: well developed, well nourished elderly Caucasian male, seated, in no evident distress Head: head normocephalic and atraumatic.   Neck: supple with no carotid or supraclavicular bruits Cardiovascular: regular rate and rhythm, no murmurs Musculoskeletal: no deformity Skin:  no rash/petichiae Vascular:  Normal pulses all extremities  Neurologic Exam Mental Status: Awake and fully alert. Oriented to place and time. Recent and remote memory diminished. Attention span, concentration and fund of knowledge appropriate. Mood and affect appropriate.  Recall 1/3.  Able to name only 7animals which can walk on 4 legs.  Clock drawing 1/4.  Mini-Mental status exam score  16/30 ( last visit 2130).  Geriatric depression scale not done. Cranial Nerves: Fundoscopic exam not done . Pupils equal, briskly reactive to light. Extraocular movements full without nystagmus. Visual fields full to confrontation. Hearing diminished bilaterally. Facial sensation intact. Face, tongue, palate moves normally and symmetrically.  Motor: Normal bulk and tone. Normal strength in all tested extremity muscles. Sensory.: intact to touch , pinprick , position and vibratory sensation.  Coordination: Rapid alternating movements normal  in all extremities. Finger-to-nose and heel-to-shin performed accurately bilaterally. Gait and Station: Deferred as patient is on wheelchair and has not been walking a lot did not get his cane or walker Reflexes: 1+ and symmetric except both ankle jerks are depressed slightly.. Toes downgoing.        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  Vascular risk factors of coronary artery disease, diabetes, hypertension, hyperlipidemia.  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  Recent episode of left hemispheric TIA due to noncompliance with anticoagulation for A-fib.     PLAN:I had a long discussion with the patient and his wife regarding his memory loss and mild vascular dementia which seems send since stopping the Namenda which he was tolerating well without side effects.  Recommend he restart Namenda 10 mg twice daily and increase participation in cognitively challenging activities like solving crossword puzzles, playing bridge and sudoku.  We also discussed memory compensation strategies.  Continue Xarelto for stroke prevention for his A-fib and maintain aggressive risk factor modification.  Return for follow-up in the future in 6 months with my nurse practitioner or call earlier if necessary Seems to have.Greater than 50% time during this 35-minute  visit was spent on counseling and coordination of care about his embolic occipital stroke,TIA, mild dementia as well as discussion about mild cognitive impairment and answering questions. Delia Heady, MD Note: This document was prepared with digital dictation and possible smart phrase technology. Any transcriptional errors that result from this process are  unintentional.

## 2023-02-05 NOTE — Patient Instructions (Signed)
I had a long discussion with the patient and his wife regarding his memory loss and mild vascular dementia which seems send since stopping the Namenda which he was tolerating well without side effects.  Recommend he restart Namenda 10 mg twice daily and increase participation in cognitively challenging activities like solving crossword puzzles, playing bridge and sudoku.  We also discussed memory compensation strategies.  Continue Xarelto for stroke prevention for his A-fib and maintain aggressive risk factor modification.  Return for follow-up in the future in 6 months with my nurse practitioner or call earlier if necessary

## 2023-02-22 LAB — CUP PACEART REMOTE DEVICE CHECK
Date Time Interrogation Session: 20240503230735
Implantable Pulse Generator Implant Date: 20220610

## 2023-02-23 ENCOUNTER — Ambulatory Visit (INDEPENDENT_AMBULATORY_CARE_PROVIDER_SITE_OTHER): Payer: Medicare PPO

## 2023-02-23 DIAGNOSIS — I639 Cerebral infarction, unspecified: Secondary | ICD-10-CM | POA: Diagnosis not present

## 2023-02-24 NOTE — Progress Notes (Signed)
Carelink Summary Report / Loop Recorder 

## 2023-03-06 ENCOUNTER — Other Ambulatory Visit: Payer: Self-pay

## 2023-03-06 DIAGNOSIS — I480 Paroxysmal atrial fibrillation: Secondary | ICD-10-CM

## 2023-03-06 MED ORDER — RIVAROXABAN 15 MG PO TABS
ORAL_TABLET | ORAL | 5 refills | Status: DC
Start: 2023-03-06 — End: 2023-05-29

## 2023-03-06 MED ORDER — MIDODRINE HCL 2.5 MG PO TABS: 2.5000 mg | ORAL_TABLET | Freq: Three times a day (TID) | ORAL | 2 refills | Status: DC

## 2023-03-06 MED ORDER — MEXILETINE HCL 250 MG PO CAPS: ORAL_CAPSULE | ORAL | 2 refills | Status: DC

## 2023-03-06 NOTE — Telephone Encounter (Signed)
Prescription refill request for Xarelto received.  Indication:afib Last office visit:12/23 Weight:82.6  kg Age:82 Scr:2.0 CrCl:33.84  ml/min  Prescription refilled

## 2023-03-23 NOTE — Progress Notes (Signed)
Carelink Summary Report / Loop Recorder 

## 2023-03-30 ENCOUNTER — Ambulatory Visit: Payer: Medicare PPO

## 2023-05-04 ENCOUNTER — Ambulatory Visit: Payer: Medicare PPO

## 2023-05-06 ENCOUNTER — Telehealth: Payer: Self-pay

## 2023-05-06 NOTE — Telephone Encounter (Signed)
Pt wife called letting us know that they got the letter we sent about missed transmission and they are out of town for the next 2 weeks and will send it when they get back home

## 2023-05-29 ENCOUNTER — Other Ambulatory Visit: Payer: Self-pay

## 2023-05-29 DIAGNOSIS — I480 Paroxysmal atrial fibrillation: Secondary | ICD-10-CM

## 2023-05-29 MED ORDER — RIVAROXABAN 15 MG PO TABS
ORAL_TABLET | ORAL | 1 refills | Status: DC
Start: 2023-05-29 — End: 2023-10-19

## 2023-05-29 NOTE — Telephone Encounter (Signed)
Prescription refill request for Xarelto received.  Indication: Afib  Last office visit: 09/30/22 ([place])  Weight: 82.6kg Age: 82 Scr: 2.09 (01/07/23)  CrCl: 32.4ml/min  Appropriate dose. Refill sent.

## 2023-06-05 LAB — CUP PACEART REMOTE DEVICE CHECK
Date Time Interrogation Session: 20240816093658
Implantable Pulse Generator Implant Date: 20220610

## 2023-06-08 ENCOUNTER — Ambulatory Visit (INDEPENDENT_AMBULATORY_CARE_PROVIDER_SITE_OTHER): Payer: Medicare PPO

## 2023-06-08 DIAGNOSIS — I639 Cerebral infarction, unspecified: Secondary | ICD-10-CM

## 2023-06-17 NOTE — Progress Notes (Signed)
Carelink Summary Report / Loop Recorder 

## 2023-07-13 ENCOUNTER — Ambulatory Visit (INDEPENDENT_AMBULATORY_CARE_PROVIDER_SITE_OTHER): Payer: Medicare PPO

## 2023-07-13 DIAGNOSIS — I639 Cerebral infarction, unspecified: Secondary | ICD-10-CM | POA: Diagnosis not present

## 2023-07-23 NOTE — Progress Notes (Signed)
Carelink Summary Report / Loop Recorder 

## 2023-08-06 ENCOUNTER — Other Ambulatory Visit: Payer: Self-pay | Admitting: Neurology

## 2023-08-11 ENCOUNTER — Ambulatory Visit: Payer: Medicare PPO | Admitting: Adult Health

## 2023-08-11 ENCOUNTER — Encounter: Payer: Self-pay | Admitting: Adult Health

## 2023-08-11 VITALS — BP 132/76 | HR 70 | Ht 71.0 in | Wt 176.0 lb

## 2023-08-11 DIAGNOSIS — G3184 Mild cognitive impairment, so stated: Secondary | ICD-10-CM

## 2023-08-11 DIAGNOSIS — Z8673 Personal history of transient ischemic attack (TIA), and cerebral infarction without residual deficits: Secondary | ICD-10-CM

## 2023-08-11 MED ORDER — MEMANTINE HCL 10 MG PO TABS: 10.0000 mg | ORAL_TABLET | Freq: Two times a day (BID) | ORAL | 3 refills | Status: AC

## 2023-08-11 NOTE — Progress Notes (Signed)
[facility] [address] [place]. [place] [address] [phone]       OFFICE FOLLOW UP VISIT NOTE  [name] Date of Birth:  14-Nov-1940 Medical Record Number:  [id number]   Primary neurologist: Dr. 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 Pt states his memory has been declining. Pt's wife has questions wether if pt needs to continue coming here or can his PCP take over.        HPI:   Initial visit 02/19/2021 Dr. Pearlean Brownie: [name] is a pleasant 39 Caucasian male seen today for initial office consultation visit for stroke.  He is accompanied by his wife [name] and history is obtained from them and review of electronic medical records and I personally reviewed available imaging films in PACS.  He has past medical history of diabetes, hypertension, hyperlipidemia and coronary artery disease.  He was seen on 01/07/2021 at [facility].  He was sitting on a stool in his kitchen table when if fell down.  He is unable to get up and was off balance.  He was also confused and disoriented.  He stated he was seeing black spots in front of both eyes.  He was brought in by EMS and was quite agitated when he came in.  Patient states he has no memory of his 5-day hospitalization.  He was seen on arrival by telemetry neurologist who thought he had a TIA and NIH stroke scale on admission was 0.  CT scan of the head on admission was unremarkable and subsequent hospital admission and MRI and further work-up was suggested.  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  2D echo showed normal ejection fraction without  cardiac source of embolism.  LDL cholesterol was 1 1 3  mg percent.  Hemoglobin A1c was 6.8.  Urine drug screen was negative.  Patient was discharged on aspirin and Plavix for 3 weeks and subsequently Plavix alone.  Patient states he still sees occasional black spots in front of his eyes but his vision is mostly improved.  He is also improved in his coordination and balance.  He however has noticed worsening of his baseline memory and cognitive difficulties which he was actually having for the last 5 6 years ago and before his stroke.  He also complains of feeling tired and having low energy.  Patient was started on Lipitor but his primary care physician has changed him to Crestor because of urinary retention.  Patient does have family history of Alzheimer's in his mother and worries about it.  He is tolerating Plavix well without bruising or bleeding.  His blood pressure is well controlled today slightly elevated in office at 146/95.  He was having some orthostatic dizziness but he has been started on midodrine which seems to have helped.  He has no prior history of strokes TIA seizures prior neurological problems.  He denies any history of atrial fibrillation, cardiac arrhythmias but does have history of coronary artery disease and is undergone stenting by Dr. Eldridge Dace. Update 07/09/2021 Dr. Pearlean Brownie: He returns for follow-up after last visit 4 months ago.  He  is accompanied by his wife.  She states that his memory is not better.  He continues to have short-term memory difficulties and trouble remembering recent information.  He struggles with calculations and with numbers.  He still mostly independent but wife has to keep a close watch on him.  He denies any symptoms of delusion, hallucinations, unsafe behavior.  His gait and balance are good with no falls or injuries.  He has been having some bradycardia and has an appointment upcoming to see cardiologist Dr. Elberta Fortis for this.  He has finished working with physical  therapy for his balance.  He had lab work for reversible causes of memory loss on 02/19/2021 all of which were normal.  EEG on 03/08/2019 showed mild generalized slowing without epileptiform activity.  He had a loop recorder inserted and so for paroxysmal A. fib has not yet been found.  On Mini-Mental status exam testing today scored 24/30.  He has s was not found to be depressed on geriatric depression scale.   Update 11/11/2021 Dr. Pearlean Brownie: He returns for follow-up after last visit 4 months ago.  He is accompanied by his wife.  He states he noticed improvement in his cognitive difficulties after starting Aricept and was tolerating 10 mg daily quite well but then after the first few weeks of improvement his benefit plateaued out.  He however started having some nasal bleeding and he felt this may have been related to Aricept so he reduced the dose to 5 mg.  However his cardiologist recently switched Eliquis to Xarelto last Friday and so far is tolerated well without any recurrence of nasal bleeding.  Patient has not had any recurrent stroke or TIA symptoms.  He had lipid profile on 10/07/2021 with LDL cholesterol being optimal at 43 mg percent.  On Mini-Mental status exam today scored 24/30 on recall 1/3 and able to name 9 animals which can walk on 4 legs.  On the clock drawing he scored 3/4.  This appears unchanged compared to last visit Update 04/03/2022 Dr. Pearlean Brownie: He returns for follow-up after last visit for an months ago.  He is accompanied by his wife who provides most of the history.  Patient was admitted on 02/07/2022 to [facility] with sudden onset of speech difficulties.  He had difficult time finding the right words to use and family noted that he was struggling to communicate.  Symptoms improved after reaching the emergency room and hence was not felt to be a candidate for thrombolysis.  Patient had a history of atrial fibrillation and previously been on Eliquis but was discontinued due to nasal  bleed.  He was switched to Xarelto but patient electively stopped taking it several months ago.  He also stopped his most of his medications except for diabetes medicine.  CT head was negative for acute process.  Lab work was significant for elevated creatinine of 1.8.  MRI scan of the brain showed no acute infarct.  Echocardiogram showed decreased ejection fraction of 40 to 45% with mild global hypokinesis.  LDL cholesterol is elevated at 119 mg percent and hemoglobin A1c was 6.6.  Patient was counseled to be compliant with all his medications and restarted on anticoagulation with Xarelto.  Patient has been pain at home but he has been having a urinary problem and has a indwelling urinary catheter.  They are waiting for follow-up appointment with [facility] to see if catheter can come out.  Patient is able to walk with a cane but is unsteady  and his wife has to walk close behind him.  He has had 2 falls in the last 3 weeks.  Patient states that when he walks his feet move faster and he has trouble and slowing himself down.  Continues to have memory difficulties and cognitive impairment.  He is tolerating Aricept in the past.  He was offered Namenda but declined in the past but states now he is willing to try it.  He does get some occasional visual hallucinations and does get disoriented from time to time.  He scored 22/30 on Mini-Mental testing today which is slight decline from 24/30 at last visit. Update 08/06/2022 Dr. Pearlean Brownie; he returns for follow-up after last visit 4 months ago.  He is accompanied by his wife.  They both feel he is doing a lot better after starting Namenda which she is tolerating well without side effects.  He can now dress himself as well as pentoxifylline memory is improved.  The wife in fact feels that his gait instability is also better than his abnormal falls.  Patient denies any delusions, hallucinations, unsafe behavior agitation.  He does state quite a bit.  He remains on Xarelto  which is tolerating well without bleeding or bruising.  His sugars are doing well and blood pressures under good control.  He has no new complaints.  On Mini-Mental status exam today scored 21/30 which is unchanged from last visit. Update/18/2024 Dr. Pearlean Brownie: He returns for follow-up after last visit 6 months ago.  He is accompanied by his wife.  They feel he has some cognitive worsening in the last few months after stopping Namenda.  Patient developed constipation in the right provide possible risk of side effects he decided to stop it.  He is working with the primary care physician on the bowel regimen but still has constipation.  Wife is not convinced that is not causing symptoms related to he has not been walking a lot and chair with his wheelchair.  He walks a little bit from his bed to the bathroom agitation, delusions.  He does get irritable from time to time but has never been violent.  He can feed himself.  His limited vision  is also responsible for his limited ambulation.  He did not fall once. When he tried to get up and walk by himself.  On Mini-Mental status exam Today He Scored 16/30 which is  a Decline from 21/30 at Last Visit   Update 08/11/2023 JM: Patient returns for follow-up visit after prior visit 6 months ago with Dr. Pearlean Brownie accompanied by his wife.  Overall stable since prior visit.  Reports cognition has declined slightly since prior visit, more concern with short term memory.  MMSE today 18/30 (prior 16/30).  Continues on memantine 10 mg twice daily, denies side effects.  No significant behavioral concerns.  Primarily transfers via wheelchair due to frequent falls, wife assists with ADLs.  Vision remains impaired which affects his ambulation, routinely follows with ophtho with routine exams.   Denies new stroke/TIA symptoms.  Compliant on stroke prevention medications and routinely follows with PCP and cardiology.  Blood pressure not routinely monitors, was previously monitoring and  stable.       ROS:   14 system review of systems is positive for those listed in HPI and all other systems negative  PMH:  Past Medical History:  Diagnosis Date   CAD (coronary artery disease) 12/10/2015   Ant MI 2/17>>DES to LAD // Myoview 12/22: EF 37, no ischemia (int risk)  CKD (chronic kidney disease), stage III (HCC) 10/01/2021   Diabetes (HCC) 12/10/2015   Dilated aortic root (HCC) 09/06/2018   Echo 6/17: 42 mm // Echo 10/2018: 43 mm // Echocardiogram 3/22: 42 mm // Echo 12/22: 44 mm   Essential hypertension 09/03/2017   Hyperlipidemia    Ischemic cardiomyopathy    s/p Ant MI 2/17 - Echo EF 40-45 // Echo 6/17: EF 55 // Echo 10/2018: EF 50 // Echo 3/22: EF 50-55   Paroxysmal atrial fibrillation (HCC) 09/30/2021   S/p CVA in 3/22 >> ILR // AFib IDd on ILR>>anticoagulation started   PVC's (premature ventricular contractions) 09/30/2021   Monitor 9/22: PVCs 33% // Metoprolol succ started // ILR interr 11/22: PVCs 22.5% // Echo 12/22: Frequent PVCs; EF 45-50, global HK, mild concentric LVH, normal RVSF, trivial MR, trivial AI, aortic root 44 mm   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 Dx ~ 2010.    Social History:  Social History   Socioeconomic History   Marital status: Married    Spouse name: [name]   Number of children: Not on file   Years of education: Not on file   Highest education level: Not on file  Occupational History   Not on file  Tobacco Use   Smoking status: Former    Current packs/day: 1.00    Average packs/day: 1 pack/day for 30.0 years (30.0 ttl pk-yrs)    Types: Cigarettes   Smokeless tobacco: Never   Tobacco comments:    quit 1993  Vaping Use   Vaping status: Never Used  Substance and Sexual Activity   Alcohol use: Yes    Alcohol/week: 0.0 standard drinks of alcohol    Comment: rare drink   Drug use: No   Sexual activity: Not on file  Other Topics Concern   Not on  file  Social History Narrative   Lives with wife and son   Right Handed   Drinks 2-3 cups caffeine daily   Social Determinants of Health   Financial Resource Strain: Not on file  Food Insecurity: No Food Insecurity (05/13/2022)   Received from [facility]   Hunger Vital Sign  Transportation Needs: Not on file  Physical Activity: Not on file  Stress: Not on file  Social Connections: Unknown (12/30/2022)   Received from [facility]   Social Network    Social Network: Not on file  Intimate Partner Violence: Unknown (12/30/2022)   Received from [facility]   HITS    Physically Hurt: Not on file    Insult or Talk Down To: Not on file    Threaten Physical Harm: Not on file    Scream or Curse: Not on file    Medications:   Current Outpatient Medications on File Prior to Visit  Medication Sig Dispense Refill   acetaminophen (TYLENOL) 325 MG tablet Take 650 mg by mouth every 6 (six) hours as needed for mild pain, moderate pain or headache.     memantine (NAMENDA) 10 MG tablet TAKE 1 TABLET TWICE DAILY 180 tablet 0   mexiletine (MEXITIL) 250 MG capsule TAKE 1 CAPSULE(250 MG) BY MOUTH TWICE DAILY 180 capsule 2   midodrine (PROAMATINE) 2.5 MG tablet Take 1 tablet (2.5 mg total) by mouth 3 (three) times daily with meals. 270 tablet 2   nitroGLYCERIN (NITROSTAT) 0.4 MG SL tablet Place 0.4 mg under  the tongue as needed.     polyethylene glycol (MIRALAX / GLYCOLAX) 17 g packet Take 17 g by mouth daily. 14 each 0   Rivaroxaban (XARELTO) 15 MG TABS tablet TAKE 1 TABLET(15 MG) BY MOUTH DAILY WITH SUPPER 90 tablet 1   rosuvastatin (CRESTOR) 20 MG tablet Take 20 mg by mouth daily.     No current facility-administered medications on file prior to visit.    Allergies:   Allergies  Allergen Reactions   Sulfa Antibiotics Hives    Physical Exam Today's Vitals   08/11/23 1352  BP: (!) 158/78  Pulse: 70  Weight: 176 lb (79.8 kg)  Height: 5\' 11"  (1.803 m)    Body mass index is 24.55 kg/m.  General: well developed, well nourished elderly Caucasian male, seated, in no evident distress Head: head normocephalic and atraumatic.   Neck: supple with no carotid or supraclavicular bruits Cardiovascular: regular rate and rhythm, no murmurs Musculoskeletal: no deformity Skin:  no rash/petichiae Vascular:  Normal pulses all extremities  Neurologic Exam Mental Status: Awake and fully alert. Oriented to place and time. Recent memory impaired and remote memory intact. Attention span, concentration and fund of knowledge appropriate during visit. Mood and affect appropriate.   Cranial Nerves: Pupils equal, briskly reactive to light. Extraocular movements full without nystagmus. Visual fields full to confrontation. Hearing diminished bilaterally. Facial sensation intact. Face, tongue, palate moves normally and symmetrically.  Motor: Normal bulk and tone. Normal strength in all tested extremity muscles. Sensory.: intact to touch , pinprick , position and vibratory sensation.  Coordination: Rapid alternating movements normal in all extremities. Finger-to-nose and heel-to-shin performed accurately bilaterally. 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  Vascular risk factors of coronary artery disease, diabetes, hypertension, hyperlipidemia.  He has memory  loss and mild cognitive impairment due to mild vascular dementia.  Episode of left hemispheric TIA in 01/2022 due to noncompliance with anticoagulation for A-fib.     PLAN:  1.  Moderate vascular dementia -Continue Namenda 10 mg twice daily - refill provided -Discussed importance of ensuring good sleep, healthy diet and management of vascular risk factors. Physical activity limited due to frequent falls. Cognitive exercises limited due to vision impairment.  -Prior intolerance to Aricept  2.  History of stroke and TIA -Continue Xarelto and Crestor managed by PCP/cardiology -Continue close PCP/cardiology follow-up for aggressive stroke risk factor management    Per wife request, can return back to PCP for ongoing monitoring and management. Can follow up in office as needed    I spent 30 minutes of face-to-face and non-face-to-face time with patient.  This included previsit chart review, lab review, study review, order entry, electronic health  record documentation, patient education and discussion regarding above diagnoses and treatment plan and answered all other questions to patient's satisfaction  Ihor Austin, [facility]  [facility] [address] [place], [place] [address]  Phone [phone] Fax [phone] Note: This document was prepared with digital dictation and possible smart phrase technology. Any transcriptional errors that result from this process are unintentional.

## 2023-08-11 NOTE — Patient Instructions (Addendum)
Your Plan:  Continue Namenda 10 mg twice daily for cognition  Ensure routine cognitive exercises as well as ensuring good sleep, healthy diet, good sleep and management of vascular risk factors  Continue current stroke prevention medications and continue routine follow-up PCP and cardiology for aggressive stroke risk factor management        Thank you for coming to see [place] at [facility]. I hope we have been able to provide you high quality care today.  You may receive a patient satisfaction survey over the next few weeks. We would appreciate your feedback and comments so that we may continue to improve ourselves and the health of our patients.

## 2023-08-17 ENCOUNTER — Ambulatory Visit (INDEPENDENT_AMBULATORY_CARE_PROVIDER_SITE_OTHER): Payer: Medicare PPO

## 2023-08-17 DIAGNOSIS — I639 Cerebral infarction, unspecified: Secondary | ICD-10-CM | POA: Diagnosis not present

## 2023-08-18 LAB — CUP PACEART REMOTE DEVICE CHECK
Date Time Interrogation Session: 20241027230558
Implantable Pulse Generator Implant Date: 20220610

## 2023-09-02 NOTE — Progress Notes (Signed)
Carelink Summary Report / Loop Recorder 

## 2023-09-20 LAB — CUP PACEART REMOTE DEVICE CHECK
Date Time Interrogation Session: 20241129230220
Implantable Pulse Generator Implant Date: 20220610

## 2023-09-21 ENCOUNTER — Ambulatory Visit: Payer: Medicare PPO

## 2023-09-21 DIAGNOSIS — I639 Cerebral infarction, unspecified: Secondary | ICD-10-CM | POA: Diagnosis not present

## 2023-10-02 ENCOUNTER — Ambulatory Visit: Payer: Medicare PPO | Admitting: Student

## 2023-10-19 ENCOUNTER — Other Ambulatory Visit: Payer: Self-pay | Admitting: Cardiology

## 2023-10-19 DIAGNOSIS — I480 Paroxysmal atrial fibrillation: Secondary | ICD-10-CM

## 2023-10-19 NOTE — Telephone Encounter (Signed)
Prescription refill request for Xarelto received.  Indication:cva Last office visit:upcoming Weight:79.8  kg Age:82 Scr:2.09  3/24 CrCl:31.29  ml/min  Prescription refilled

## 2023-10-26 ENCOUNTER — Ambulatory Visit (INDEPENDENT_AMBULATORY_CARE_PROVIDER_SITE_OTHER): Payer: Medicare PPO

## 2023-10-26 DIAGNOSIS — I639 Cerebral infarction, unspecified: Secondary | ICD-10-CM | POA: Diagnosis not present

## 2023-10-26 LAB — CUP PACEART REMOTE DEVICE CHECK
Date Time Interrogation Session: 20250105230557
Implantable Pulse Generator Implant Date: 20220610

## 2023-11-10 ENCOUNTER — Other Ambulatory Visit: Payer: Self-pay | Admitting: Cardiology

## 2023-11-30 ENCOUNTER — Ambulatory Visit (INDEPENDENT_AMBULATORY_CARE_PROVIDER_SITE_OTHER): Payer: Medicare PPO

## 2023-11-30 DIAGNOSIS — I639 Cerebral infarction, unspecified: Secondary | ICD-10-CM | POA: Diagnosis not present

## 2023-12-01 LAB — CUP PACEART REMOTE DEVICE CHECK
Date Time Interrogation Session: 20250209230930
Implantable Pulse Generator Implant Date: 20220610

## 2023-12-02 NOTE — Progress Notes (Signed)
Carelink Summary Report / Loop Recorder

## 2023-12-03 ENCOUNTER — Other Ambulatory Visit: Payer: Self-pay | Admitting: Cardiology

## 2023-12-08 ENCOUNTER — Encounter: Payer: Medicare PPO | Admitting: Student

## 2023-12-26 ENCOUNTER — Other Ambulatory Visit: Payer: Self-pay | Admitting: Cardiology

## 2024-01-04 ENCOUNTER — Ambulatory Visit: Payer: Medicare PPO

## 2024-01-04 DIAGNOSIS — I639 Cerebral infarction, unspecified: Secondary | ICD-10-CM | POA: Diagnosis not present

## 2024-01-04 NOTE — Addendum Note (Signed)
 Addended by: Geralyn Flash D on: 01/04/2024 09:26 AM   Modules accepted: Orders

## 2024-01-04 NOTE — Progress Notes (Signed)
 [facility] Summary Report / Loop Recorder

## 2024-01-05 LAB — CUP PACEART REMOTE DEVICE CHECK
Date Time Interrogation Session: 20250316230603
Implantable Pulse Generator Implant Date: 20220610

## 2024-01-12 NOTE — Progress Notes (Signed)
 This encounter was created in error - please disregard.

## 2024-01-13 ENCOUNTER — Ambulatory Visit: Payer: Medicare PPO | Admitting: Pulmonary Disease

## 2024-01-15 ENCOUNTER — Ambulatory Visit: Admitting: Podiatry

## 2024-01-19 ENCOUNTER — Other Ambulatory Visit: Payer: Self-pay | Admitting: Cardiology

## 2024-02-08 ENCOUNTER — Ambulatory Visit: Payer: Medicare PPO

## 2024-02-08 DIAGNOSIS — I639 Cerebral infarction, unspecified: Secondary | ICD-10-CM | POA: Diagnosis not present

## 2024-02-09 LAB — CUP PACEART REMOTE DEVICE CHECK
Date Time Interrogation Session: 20250420230649
Implantable Pulse Generator Implant Date: 20220610

## 2024-02-16 NOTE — Addendum Note (Signed)
 Addended by: Edra Govern D on: 02/16/2024 04:35 PM   Modules accepted: Orders

## 2024-02-16 NOTE — Progress Notes (Signed)
 [facility] Summary Report / Loop Recorder

## 2024-03-15 ENCOUNTER — Ambulatory Visit (INDEPENDENT_AMBULATORY_CARE_PROVIDER_SITE_OTHER): Payer: Medicare PPO

## 2024-03-15 DIAGNOSIS — I639 Cerebral infarction, unspecified: Secondary | ICD-10-CM

## 2024-03-15 LAB — CUP PACEART REMOTE DEVICE CHECK
Date Time Interrogation Session: 20250526230910
Implantable Pulse Generator Implant Date: 20220610

## 2024-03-16 ENCOUNTER — Ambulatory Visit: Payer: Self-pay | Admitting: Cardiology

## 2024-03-21 ENCOUNTER — Other Ambulatory Visit (HOSPITAL_COMMUNITY): Payer: Self-pay | Admitting: Nephrology

## 2024-03-21 DIAGNOSIS — N1832 Chronic kidney disease, stage 3b: Secondary | ICD-10-CM

## 2024-03-21 DIAGNOSIS — R809 Proteinuria, unspecified: Secondary | ICD-10-CM

## 2024-03-24 NOTE — Addendum Note (Signed)
 Addended by: Edra Govern D on: 03/24/2024 05:44 PM   Modules accepted: Orders

## 2024-03-24 NOTE — Progress Notes (Signed)
 [facility] Summary Report / Loop Recorder

## 2024-03-25 ENCOUNTER — Ambulatory Visit (HOSPITAL_COMMUNITY)
Admission: RE | Admit: 2024-03-25 | Discharge: 2024-03-25 | Disposition: A | Discharge status: Home / Self Care | Source: Ambulatory Visit | Attending: Nephrology | Admitting: Nephrology

## 2024-03-25 DIAGNOSIS — N1832 Chronic kidney disease, stage 3b: Secondary | ICD-10-CM | POA: Insufficient documentation

## 2024-03-25 DIAGNOSIS — R809 Proteinuria, unspecified: Secondary | ICD-10-CM | POA: Insufficient documentation

## 2024-04-14 ENCOUNTER — Ambulatory Visit (INDEPENDENT_AMBULATORY_CARE_PROVIDER_SITE_OTHER)

## 2024-04-14 DIAGNOSIS — I639 Cerebral infarction, unspecified: Secondary | ICD-10-CM

## 2024-04-14 LAB — CUP PACEART REMOTE DEVICE CHECK
Date Time Interrogation Session: 20250625230857
Implantable Pulse Generator Implant Date: 20220610

## 2024-04-17 ENCOUNTER — Ambulatory Visit: Payer: Self-pay | Admitting: Cardiology

## 2024-04-18 ENCOUNTER — Ambulatory Visit: Payer: Medicare PPO

## 2024-05-02 NOTE — Progress Notes (Signed)
 [facility] Summary Report / Loop Recorder

## 2024-05-16 ENCOUNTER — Ambulatory Visit

## 2024-05-16 DIAGNOSIS — I639 Cerebral infarction, unspecified: Secondary | ICD-10-CM | POA: Diagnosis not present

## 2024-05-17 ENCOUNTER — Ambulatory Visit: Payer: Self-pay | Admitting: Cardiology

## 2024-05-17 LAB — CUP PACEART REMOTE DEVICE CHECK
Date Time Interrogation Session: 20250727231159
Implantable Pulse Generator Implant Date: 20220610

## 2024-05-23 ENCOUNTER — Ambulatory Visit: Payer: Medicare PPO

## 2024-06-16 ENCOUNTER — Ambulatory Visit: Payer: Self-pay | Admitting: Cardiology

## 2024-06-16 ENCOUNTER — Ambulatory Visit

## 2024-06-16 DIAGNOSIS — I639 Cerebral infarction, unspecified: Secondary | ICD-10-CM | POA: Diagnosis not present

## 2024-06-16 LAB — CUP PACEART REMOTE DEVICE CHECK
Date Time Interrogation Session: 20250827231433
Implantable Pulse Generator Implant Date: 20220610

## 2024-06-27 ENCOUNTER — Ambulatory Visit: Payer: Medicare PPO

## 2024-06-29 NOTE — Progress Notes (Signed)
 [facility] Summary Report / Loop Recorder

## 2024-06-30 NOTE — Progress Notes (Signed)
 Remote Loop Recorder Transmission

## 2024-07-18 ENCOUNTER — Ambulatory Visit

## 2024-07-18 DIAGNOSIS — I639 Cerebral infarction, unspecified: Secondary | ICD-10-CM | POA: Diagnosis not present

## 2024-07-18 LAB — CUP PACEART REMOTE DEVICE CHECK
Date Time Interrogation Session: 20250928231513
Implantable Pulse Generator Implant Date: 20220610

## 2024-07-19 ENCOUNTER — Ambulatory Visit: Payer: Self-pay | Admitting: Cardiology

## 2024-07-20 NOTE — Progress Notes (Signed)
 Remote Loop Recorder Transmission

## 2024-08-01 ENCOUNTER — Ambulatory Visit: Payer: Medicare PPO

## 2024-08-04 ENCOUNTER — Other Ambulatory Visit: Payer: Self-pay | Admitting: Cardiology

## 2024-08-04 DIAGNOSIS — I480 Paroxysmal atrial fibrillation: Secondary | ICD-10-CM

## 2024-08-04 NOTE — Telephone Encounter (Signed)
 Prescription refill request for Xarelto  received.  Indication:afib Last office visit:3/25 Weight:79.8  kg Age:83 Scr:1.83  2025 CrCl:35.13  ml/min  Prescription refilled

## 2024-08-18 ENCOUNTER — Ambulatory Visit

## 2024-08-19 ENCOUNTER — Ambulatory Visit: Payer: Self-pay | Admitting: Cardiology

## 2024-08-19 ENCOUNTER — Ambulatory Visit: Attending: Cardiology

## 2024-08-19 DIAGNOSIS — I639 Cerebral infarction, unspecified: Secondary | ICD-10-CM | POA: Diagnosis not present

## 2024-08-19 LAB — CUP PACEART REMOTE DEVICE CHECK
Date Time Interrogation Session: 20251030230419
Implantable Pulse Generator Implant Date: 20220610

## 2024-08-24 NOTE — Progress Notes (Signed)
 Remote Loop Recorder Transmission

## 2024-09-19 ENCOUNTER — Ambulatory Visit

## 2024-09-19 ENCOUNTER — Ambulatory Visit: Attending: Cardiology

## 2024-09-19 DIAGNOSIS — I639 Cerebral infarction, unspecified: Secondary | ICD-10-CM

## 2024-09-19 LAB — CUP PACEART REMOTE DEVICE CHECK
Date Time Interrogation Session: 20251130230722
Implantable Pulse Generator Implant Date: 20220610

## 2024-09-22 ENCOUNTER — Ambulatory Visit: Payer: Self-pay | Admitting: Cardiology

## 2024-09-22 NOTE — Progress Notes (Signed)
 Remote Loop Recorder Transmission

## 2024-10-20 ENCOUNTER — Ambulatory Visit

## 2024-10-20 DIAGNOSIS — I639 Cerebral infarction, unspecified: Secondary | ICD-10-CM | POA: Diagnosis not present

## 2024-10-21 LAB — CUP PACEART REMOTE DEVICE CHECK
Date Time Interrogation Session: 20251231230519
Implantable Pulse Generator Implant Date: 20220610

## 2024-10-22 ENCOUNTER — Ambulatory Visit: Payer: Self-pay | Admitting: Cardiology

## 2024-10-24 NOTE — Progress Notes (Signed)
 Remote Loop Recorder Transmission

## 2024-11-20 ENCOUNTER — Ambulatory Visit: Attending: Cardiology

## 2024-11-20 DIAGNOSIS — I639 Cerebral infarction, unspecified: Secondary | ICD-10-CM

## 2024-11-21 ENCOUNTER — Ambulatory Visit

## 2024-11-22 LAB — CUP PACEART REMOTE DEVICE CHECK
Date Time Interrogation Session: 20260131230526
Implantable Pulse Generator Implant Date: 20220610

## 2024-11-25 ENCOUNTER — Ambulatory Visit: Payer: Self-pay | Admitting: Cardiology

## 2024-11-25 NOTE — Progress Notes (Signed)
 Remote Loop Recorder Transmission

## 2024-12-21 ENCOUNTER — Ambulatory Visit

## 2024-12-22 ENCOUNTER — Ambulatory Visit

## 2025-01-21 ENCOUNTER — Ambulatory Visit

## 2025-02-21 ENCOUNTER — Ambulatory Visit

## 2025-03-24 ENCOUNTER — Ambulatory Visit

## 2025-03-27 ENCOUNTER — Ambulatory Visit: Admitting: Neurology

## 2025-04-24 ENCOUNTER — Ambulatory Visit

## 2025-05-25 ENCOUNTER — Ambulatory Visit

## 2025-06-25 ENCOUNTER — Ambulatory Visit

## 2025-07-26 ENCOUNTER — Ambulatory Visit

## 2025-08-26 ENCOUNTER — Ambulatory Visit

## 2025-09-26 ENCOUNTER — Ambulatory Visit

## 2025-10-27 ENCOUNTER — Ambulatory Visit

## 2025-11-27 ENCOUNTER — Ambulatory Visit
